# Patient Record
Sex: Female | Born: 1945 | Hispanic: No | Smoking: Former smoker
Health system: Southern US, Community
[De-identification: ages and names within clinical notes are randomized; demographics above are authoritative.]

## PROBLEM LIST (undated history)

## (undated) DIAGNOSIS — Z800 Family history of malignant neoplasm of digestive organs: Secondary | ICD-10-CM

## (undated) DIAGNOSIS — I739 Peripheral vascular disease, unspecified: Secondary | ICD-10-CM

## (undated) DIAGNOSIS — I10 Essential (primary) hypertension: Secondary | ICD-10-CM

## (undated) DIAGNOSIS — M1990 Unspecified osteoarthritis, unspecified site: Secondary | ICD-10-CM

## (undated) DIAGNOSIS — Z803 Family history of malignant neoplasm of breast: Secondary | ICD-10-CM

## (undated) DIAGNOSIS — Z8041 Family history of malignant neoplasm of ovary: Secondary | ICD-10-CM

## (undated) DIAGNOSIS — L309 Dermatitis, unspecified: Secondary | ICD-10-CM

## (undated) HISTORY — PX: TONSILLECTOMY: SUR1361

## (undated) HISTORY — PX: EXCISION OF BREAST BIOPSY: SHX5822

## (undated) HISTORY — PX: ABDOMINAL HYSTERECTOMY: SHX81

## (undated) HISTORY — PX: BREAST EXCISIONAL BIOPSY: SUR124

## (undated) HISTORY — PX: BREAST SURGERY: SHX581

## (undated) HISTORY — DX: Dermatitis, unspecified: L30.9

## (undated) HISTORY — DX: Family history of malignant neoplasm of ovary: Z80.41

## (undated) HISTORY — DX: Family history of malignant neoplasm of digestive organs: Z80.0

## (undated) HISTORY — DX: Family history of malignant neoplasm of breast: Z80.3

## (undated) HISTORY — DX: Unspecified osteoarthritis, unspecified site: M19.90

---

## 1980-11-07 HISTORY — PX: THYROIDECTOMY, PARTIAL: SHX18

## 1997-11-07 HISTORY — PX: EXCISION OF BREAST BIOPSY: SHX5822

## 1997-11-07 HISTORY — PX: ABDOMINAL HYSTERECTOMY: SHX81

## 1997-11-07 HISTORY — PX: BREAST SURGERY: SHX581

## 2010-10-13 ENCOUNTER — Ambulatory Visit: Payer: Self-pay | Admitting: Oncology

## 2011-03-29 ENCOUNTER — Other Ambulatory Visit: Payer: Self-pay | Admitting: Internal Medicine

## 2011-06-06 ENCOUNTER — Encounter: Payer: BC Managed Care – HMO | Admitting: Genetic Counselor

## 2011-06-22 ENCOUNTER — Encounter (HOSPITAL_BASED_OUTPATIENT_CLINIC_OR_DEPARTMENT_OTHER): Payer: BC Managed Care – HMO | Admitting: Oncology

## 2011-06-22 DIAGNOSIS — D493 Neoplasm of unspecified behavior of breast: Secondary | ICD-10-CM

## 2011-06-28 ENCOUNTER — Other Ambulatory Visit: Payer: Self-pay | Admitting: Oncology

## 2011-06-28 DIAGNOSIS — Z1231 Encounter for screening mammogram for malignant neoplasm of breast: Secondary | ICD-10-CM

## 2011-07-18 ENCOUNTER — Ambulatory Visit
Admission: RE | Admit: 2011-07-18 | Discharge: 2011-07-18 | Disposition: A | Discharge status: Home / Self Care | Payer: PRIVATE HEALTH INSURANCE | Source: Ambulatory Visit | Attending: Oncology | Admitting: Oncology

## 2011-07-18 DIAGNOSIS — Z1231 Encounter for screening mammogram for malignant neoplasm of breast: Secondary | ICD-10-CM

## 2011-07-19 ENCOUNTER — Other Ambulatory Visit: Payer: BC Managed Care – HMO

## 2011-07-20 ENCOUNTER — Other Ambulatory Visit: Payer: BC Managed Care – HMO

## 2011-07-20 ENCOUNTER — Other Ambulatory Visit: Payer: Self-pay | Admitting: Oncology

## 2011-07-20 DIAGNOSIS — R928 Other abnormal and inconclusive findings on diagnostic imaging of breast: Secondary | ICD-10-CM

## 2011-07-26 ENCOUNTER — Inpatient Hospital Stay: Admission: RE | Admit: 2011-07-26 | Payer: Self-pay | Source: Ambulatory Visit

## 2011-07-27 ENCOUNTER — Ambulatory Visit
Admission: RE | Admit: 2011-07-27 | Discharge: 2011-07-27 | Disposition: A | Discharge status: Home / Self Care | Payer: PRIVATE HEALTH INSURANCE | Source: Ambulatory Visit | Attending: Oncology | Admitting: Oncology

## 2011-07-27 DIAGNOSIS — R928 Other abnormal and inconclusive findings on diagnostic imaging of breast: Secondary | ICD-10-CM

## 2011-07-31 ENCOUNTER — Ambulatory Visit
Admission: RE | Admit: 2011-07-31 | Discharge: 2011-07-31 | Disposition: A | Discharge status: Home / Self Care | Payer: PRIVATE HEALTH INSURANCE | Source: Ambulatory Visit | Attending: Oncology | Admitting: Oncology

## 2011-07-31 DIAGNOSIS — Z1231 Encounter for screening mammogram for malignant neoplasm of breast: Secondary | ICD-10-CM

## 2011-07-31 MED ORDER — GADOBENATE DIMEGLUMINE 529 MG/ML IV SOLN: 14.0000 mL | Freq: Once | INTRAVENOUS | Status: AC | PRN

## 2011-10-10 ENCOUNTER — Ambulatory Visit: Payer: PRIVATE HEALTH INSURANCE | Admitting: Oncology

## 2012-04-06 ENCOUNTER — Telehealth: Payer: Self-pay | Admitting: *Deleted

## 2012-04-06 NOTE — Telephone Encounter (Signed)
Pt called states " I want Dr. Welton Flakes to know my sister has cancer. They found 5 tumors in her brain and they are like the ovarian cancer type, but her ovaries were removed over 8 years ago.and now it's in her lungs. I need to get a mammogram and an MRI in September. I would like to have this done at [facility] as I've have some really bad experiences at the last place they did my Mammogram. It was terrible.  So I went back to [place] to have it done, but I know I need to have it done here so in case something happens and I need help." Pt advised she is not having any current problems, pain or discomfort. Discussed with pt her last visit with MD 06/22/11. MD may need have pt come in to be seen as she missed her f/u 10/10/11. Pt requested her concerns be given to MD and to call her if she needs to be seen. Reviewed with pt I will inform Dr. Welton Flakes of her concerns and we will contact her if she needs f/u with MD. Pt verbalized understanding.

## 2012-04-12 ENCOUNTER — Telehealth: Payer: Self-pay | Admitting: Oncology

## 2012-04-12 NOTE — Telephone Encounter (Signed)
S/w the pt and she is aware of her sept 2013 appt 

## 2012-04-12 NOTE — Telephone Encounter (Signed)
Please set patient up to see me in August in follow up

## 2012-06-14 ENCOUNTER — Other Ambulatory Visit: Payer: Self-pay | Admitting: Oncology

## 2012-06-14 ENCOUNTER — Telehealth: Payer: Self-pay | Admitting: *Deleted

## 2012-06-14 DIAGNOSIS — Z1231 Encounter for screening mammogram for malignant neoplasm of breast: Secondary | ICD-10-CM

## 2012-06-14 NOTE — Telephone Encounter (Signed)
Pt called states "I have an appt on Sept 9th with MD and I need to know if I have to have a mammogram prior to seeing her. [facility]. Do I need an MRI or the # D imaging or both. I will be out of town from 9/26-10/5." Discusses with pt MD out of office. I will forward concerns and call back with MD's instructions

## 2012-06-15 NOTE — Telephone Encounter (Signed)
Patient can see me after she has the Mammogram

## 2012-06-15 NOTE — Telephone Encounter (Signed)
Per MD notified pt she will be seen her mammogram. Pt verbalized understanding onc ts sent for appt 9/9 to be r/s

## 2012-06-25 ENCOUNTER — Ambulatory Visit: Payer: BC Managed Care – HMO | Admitting: Oncology

## 2012-07-16 ENCOUNTER — Ambulatory Visit: Payer: BC Managed Care – HMO | Admitting: Oncology

## 2012-07-27 ENCOUNTER — Ambulatory Visit
Admission: RE | Admit: 2012-07-27 | Discharge: 2012-07-27 | Disposition: A | Discharge status: Home / Self Care | Payer: Medicare Other | Source: Ambulatory Visit | Attending: Oncology | Admitting: Oncology

## 2012-07-27 DIAGNOSIS — Z1231 Encounter for screening mammogram for malignant neoplasm of breast: Secondary | ICD-10-CM

## 2012-07-30 ENCOUNTER — Telehealth: Payer: Self-pay | Admitting: Oncology

## 2012-07-30 NOTE — Telephone Encounter (Signed)
S/w the pt and she is aware of her r/s sept appt to oct due to the md's schedule

## 2012-08-01 ENCOUNTER — Ambulatory Visit: Payer: BC Managed Care – HMO | Admitting: Oncology

## 2012-09-05 ENCOUNTER — Ambulatory Visit (HOSPITAL_BASED_OUTPATIENT_CLINIC_OR_DEPARTMENT_OTHER): Payer: Medicare Other | Admitting: Adult Health

## 2012-09-05 ENCOUNTER — Encounter: Payer: Self-pay | Admitting: Adult Health

## 2012-09-05 VITALS — BP 206/77 | HR 77 | Temp 98.6°F | Resp 20 | Ht 63.2 in | Wt 155.0 lb

## 2012-09-05 DIAGNOSIS — N6099 Unspecified benign mammary dysplasia of unspecified breast: Secondary | ICD-10-CM

## 2012-09-05 DIAGNOSIS — N6089 Other benign mammary dysplasias of unspecified breast: Secondary | ICD-10-CM

## 2012-09-05 DIAGNOSIS — Z809 Family history of malignant neoplasm, unspecified: Secondary | ICD-10-CM

## 2012-09-05 DIAGNOSIS — M069 Rheumatoid arthritis, unspecified: Secondary | ICD-10-CM | POA: Insufficient documentation

## 2012-09-05 DIAGNOSIS — N6091 Unspecified benign mammary dysplasia of right breast: Secondary | ICD-10-CM | POA: Insufficient documentation

## 2012-09-05 DIAGNOSIS — E785 Hyperlipidemia, unspecified: Secondary | ICD-10-CM | POA: Insufficient documentation

## 2012-09-05 NOTE — Progress Notes (Signed)
OFFICE PROGRESS NOTE  CC  Mcguire,CATHERINE, MD [address] 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 female returns for ongoing surveillance due to her high risk of developing cancer.  She is concerned because her sister has developed metastatic papillary serous carcinoma.  She has questions regarding the necessity of having an MRI.  She had a screening mammogram in September, 2013 and it was negative.    MEDICAL HISTORY:No past medical history on file.  ALLERGIES:   has no known allergies.  MEDICATIONS:  Current Outpatient Prescriptions  Medication Sig Dispense Refill  . folic acid (FOLVITE) 1 MG tablet Take 3 mg by mouth daily.      Marland Kitchen leflunomide (ARAVA) 10 MG tablet Take 10 mg by mouth daily.      . methotrexate (RHEUMATREX) 10 MG tablet Take 10 mg by mouth once a week. Caution: Chemotherapy. Protect from light.      . predniSONE (DELTASONE) 5 MG tablet Take 5 mg by mouth daily.        SURGICAL HISTORY: No past surgical history on file.  REVIEW OF SYSTEMS:   General: fatigue (-), night sweats (-), fever (-), pain (-) Lymph: palpable nodes (-) HEENT: vision changes (-), mucositis (-), gum bleeding (-), epistaxis (-) Cardiovascular: chest pain (-), palpitations (-) Pulmonary: shortness of breath (-), dyspnea on exertion (-), cough (-), hemoptysis (-) GI:  Early satiety (-), melena (-), dysphagia (-), nausea/vomiting (-), diarrhea (-) GU: dysuria (-), hematuria (-), incontinence (-) Musculoskeletal: joint swelling (-), joint pain (-), back pain (-) Neuro: weakness (-), numbness (-), headache (-), confusion (-) Skin: Rash (-), lesions (-), dryness (-) Psych: depression (-), suicidal/homicidal ideation (-), feeling of hopelessness (-)   HEALTH MAINTENANCE:  Mammogram  07/2012 Colonoscopy Bone  Scan Pap Smear n/a Eye Exam n/a Vitamin D n/a Lipid Panel n/a  PHYSICAL EXAMINATION: Blood pressure 206/77, pulse 77, temperature 98.6 F (37 C), temperature source Oral, resp. rate 20, height 5' 3.2" (1.605 m), weight 155 lb (70.308 kg). Body mass index is 27.28 kg/(m^2). ECOG PERFORMANCE STATUS: 0 - Asymptomatic General: Patient is a well appearing female in no acute distress HEENT: PERRLA, sclerae anicteric no conjunctival pallor, MMM Neck: supple, no palpable adenopathy Lungs: clear to auscultation bilaterally, no wheezes, rhonchi, or rales Cardiovascular: regular rate rhythm, S1, S2, no murmurs, rubs or gallops Abdomen: Soft, non-tender, non-distended, normoactive bowel sounds, no HSM Extremities: warm and well perfused, no clubbing, cyanosis, or edema Skin: No rashes or lesions Neuro: Non-focal Breasts: without masses or nodularity.   LABORATORY DATA: No results found for this basename: WBC, HGB, HCT, MCV, PLT      Chemistry   No results found for this basename: NA, K, CL, CO2, BUN, CREATININE, GLU   No results found for this basename: CALCIUM, ALKPHOS, AST, ALT, BILITOT       RADIOGRAPHIC STUDIES:  No results found.  ASSESSMENT: Shelby Mcguire Mcguire is a 66 y/o female with atypical ductal hyperplasia and high risk family history of carcinoma.    PLAN: We will continue to follow her annually.  She will have an MRI of the bilateral breasts due to her high risk status.  She is doing well.     All questions were answered. The patient knows to call the clinic with any problems, questions or concerns. We can certainly see the patient much sooner  if necessary.  I spent 25 minutes counseling the patient face to face. The total time spent in the appointment was 30 minutes.  This visit was done in conjunction with Dr. Welton Flakes.    [name], NP Medical Oncology [facility] Phone: [phone]    09/05/2012, 4:29 PM

## 2012-09-17 NOTE — Patient Instructions (Signed)
We will order your MRI and see you back in one year.  Please call us if you have any questions or concerns.

## 2013-07-02 ENCOUNTER — Other Ambulatory Visit: Payer: Self-pay

## 2013-07-02 DIAGNOSIS — Z1231 Encounter for screening mammogram for malignant neoplasm of breast: Secondary | ICD-10-CM

## 2013-07-06 ENCOUNTER — Ambulatory Visit: Payer: Medicare Other

## 2013-07-06 ENCOUNTER — Ambulatory Visit (INDEPENDENT_AMBULATORY_CARE_PROVIDER_SITE_OTHER): Payer: Medicare Other | Admitting: Family Medicine

## 2013-07-06 VITALS — BP 160/82 | HR 86 | Temp 98.1°F | Resp 18 | Ht 65.0 in | Wt 156.0 lb

## 2013-07-06 DIAGNOSIS — M79609 Pain in unspecified limb: Secondary | ICD-10-CM

## 2013-07-06 DIAGNOSIS — S93602A Unspecified sprain of left foot, initial encounter: Secondary | ICD-10-CM

## 2013-07-06 DIAGNOSIS — M79672 Pain in left foot: Secondary | ICD-10-CM

## 2013-07-06 DIAGNOSIS — S93609A Unspecified sprain of unspecified foot, initial encounter: Secondary | ICD-10-CM

## 2013-07-06 MED ORDER — HYDROCODONE-ACETAMINOPHEN 5-325 MG PO TABS: 1.0000 | ORAL_TABLET | Freq: Four times a day (QID) | ORAL | Status: DC | PRN

## 2013-07-06 MED ORDER — PREDNISONE 20 MG PO TABS: ORAL_TABLET | ORAL | Status: DC

## 2013-07-06 NOTE — Progress Notes (Signed)
T89-year-old woman was walking on the sidewalk when she caught her foot and she fell suddenly. She hyperextended the left foot and scraped her left knee. She also bruised the right proximal tibia. About 2 hours after the injury, she began developing more more pain with weightbearing in the left dorsal lateral foot.  Objective: No acute distress when sitting. Patient has markedly antalgic gait when weightbearing. There is no swelling of the foot and she is only mildly tender over the dorsal lateral aspect of that left foot. Her range of motion appears to be normal and her pulses are normal. There is no edema.  Patient has an abrasion on the infrapatellar left tendon, and a large ecchymotic area on the proximal right medial tibia.  [facility] reading (PRIMARY) by  Dr. Milus Glazier:  Foot x-ray, left:  Negative  Assessment: Foot sprain In patient with rheumatoid arthritis  Plan: .

## 2013-07-07 ENCOUNTER — Ambulatory Visit: Payer: Medicare Other

## 2013-07-07 NOTE — Progress Notes (Signed)
Patient was uncomfortable with the ankle brace and daughter had a Cam Walker at home that she tried on and she like the way it made her ankle felt so she wanted to also try the [employer].  She came in and was fitted for the [employer].

## 2013-07-16 ENCOUNTER — Ambulatory Visit (INDEPENDENT_AMBULATORY_CARE_PROVIDER_SITE_OTHER): Payer: Medicare Other | Admitting: Family Medicine

## 2013-07-16 VITALS — BP 142/86 | HR 74 | Temp 98.2°F | Resp 16

## 2013-07-16 DIAGNOSIS — IMO0002 Reserved for concepts with insufficient information to code with codable children: Secondary | ICD-10-CM

## 2013-07-16 DIAGNOSIS — S81009A Unspecified open wound, unspecified knee, initial encounter: Secondary | ICD-10-CM

## 2013-07-16 DIAGNOSIS — S93602S Unspecified sprain of left foot, sequela: Secondary | ICD-10-CM

## 2013-07-16 DIAGNOSIS — S81802A Unspecified open wound, left lower leg, initial encounter: Secondary | ICD-10-CM

## 2013-07-16 NOTE — Progress Notes (Signed)
Subjective: Patient is here for recheck of her painful foot. Because of some mixup in the lobby, she was not seen and that was realized when all the other patients had left. I spoke to her and offered to see her in the she wanted to see Dr. Elbert Ewings. who was not here. She was willing to see me. She continues to hurt across the midfoot. She wore the Cam Walker for one week and quit wearing it. She wanted to know whether she needs to keep wearing something on it. She is also still has the open wound below her knee which has not healed over yet and continues to weep some.  Objective: Just her left knee there is a wound approximately 1.5 seems in diameter with some early granulation tissue in the borders of it. It was fairly deep, and will take some time healing in. There is no longer minimal erythema around it and no evidence of infection. The knee has good range of motion ankle has good range of motion the midfoot has mild tenderness across the bridge of the foot point. When she walks it hurts her good deal. No pain at the fifth metatarsal area. The toes are normal and pulses are adequate.  Assessment: Left foot pain, status post midfoot sprain Wound knee  Plan Continue wearing Cam Walker another week or so. Keep the wound clean and dressed. Return if further problems. Once again apologized to her for the confusion and mix up.

## 2013-07-16 NOTE — Patient Instructions (Signed)
Continue to wear the Cam Walker for one to 2 more weeks. Once you remove the Cam Walker, continue trying to wear a firm sole supporting shoe. Also wrapped the mid foot with the Coban wrap as discussed.  The wound on the knee should gradually heal in from the parameter. If you see increasing redness and think it is getting infected get rechecked. Try and keep it clean and dry.  If the foot continues to bother you return for reevaluation

## 2013-07-30 ENCOUNTER — Ambulatory Visit
Admission: RE | Admit: 2013-07-30 | Discharge: 2013-07-30 | Disposition: A | Discharge status: Home / Self Care | Payer: Medicare Other | Source: Ambulatory Visit

## 2013-07-30 DIAGNOSIS — Z1231 Encounter for screening mammogram for malignant neoplasm of breast: Secondary | ICD-10-CM

## 2013-08-07 ENCOUNTER — Telehealth: Payer: Self-pay | Admitting: Oncology

## 2013-08-07 NOTE — Telephone Encounter (Signed)
, °

## 2013-08-22 ENCOUNTER — Telehealth: Payer: Self-pay | Admitting: Oncology

## 2013-08-22 NOTE — Telephone Encounter (Signed)
, °

## 2013-08-27 ENCOUNTER — Ambulatory Visit (INDEPENDENT_AMBULATORY_CARE_PROVIDER_SITE_OTHER): Payer: Medicare Other | Admitting: Emergency Medicine

## 2013-08-27 VITALS — BP 142/74 | HR 80 | Temp 98.9°F | Resp 18 | Ht 64.0 in | Wt 155.8 lb

## 2013-08-27 DIAGNOSIS — J209 Acute bronchitis, unspecified: Secondary | ICD-10-CM

## 2013-08-27 DIAGNOSIS — J018 Other acute sinusitis: Secondary | ICD-10-CM

## 2013-08-27 MED ORDER — AMOXICILLIN-POT CLAVULANATE 875-125 MG PO TABS: 1.0000 | ORAL_TABLET | Freq: Two times a day (BID) | ORAL | Status: DC

## 2013-08-27 MED ORDER — HYDROCOD POLST-CHLORPHEN POLST 10-8 MG/5ML PO LQCR: 5.0000 mL | Freq: Two times a day (BID) | ORAL | Status: DC | PRN

## 2013-08-27 MED ORDER — PSEUDOEPHEDRINE-GUAIFENESIN ER 60-600 MG PO TB12: 1.0000 | ORAL_TABLET | Freq: Two times a day (BID) | ORAL | Status: DC

## 2013-08-27 NOTE — Patient Instructions (Signed)

## 2013-08-27 NOTE — Progress Notes (Signed)
[facility] [address], [place] [address] [phone]  Date:  08/27/2013   Name:  Shelby Mcguire   DOB:  1946-05-21   MRN:  [id number]  PCP:  Elby Showers, MD    Chief Complaint: Sore Throat, Cough, Headache and Sinusitis   History of Present Illness:  Shelby Mcguire is a 67 y.o. very pleasant female patient who presents with the following:  Ill since Saturday with nasal congestion and purulent drainage and post nasal drip.  Has a non productive cough and no wheezing or shortness of breath.  Has severe frontal headache.   No nausea or vomiting or visual complaints.  Chilled but no fever.  Grandchildren are ill. No improvement with over the counter medications or other home remedies. Denies other complaint or health concern today.   Patient Active Problem List   Diagnosis Date Noted  . Rheumatoid arthritis 09/05/2012  . Hyperlipidemia 09/05/2012  . Atypical ductal hyperplasia of breast 09/05/2012    Past Medical History  Diagnosis Date  . Arthritis     Past Surgical History  Procedure Laterality Date  . Abdominal hysterectomy    . Cesarean section    . Breast surgery      History  Substance Use Topics  . Smoking status: Never Smoker   . Smokeless tobacco: Not on file  . Alcohol Use: No    Family History  Problem Relation Age of Onset  . Diabetes Mother   . Cancer Father   . Cancer Sister   . Cancer Maternal Aunt   . Cancer Paternal Uncle   . Cancer Sister   . Cancer Sister     No Known Allergies  Medication list has been reviewed and updated.  Current Outpatient Prescriptions on File Prior to Visit  Medication Sig Dispense Refill  . folic acid (FOLVITE) 1 MG tablet Take 3 mg by mouth daily.      Marland Kitchen leflunomide (ARAVA) 10 MG tablet Take 10 mg by mouth daily.      . methotrexate (RHEUMATREX) 10 MG tablet Take 10 mg by mouth once a week. Caution: Chemotherapy. Protect from light.      . predniSONE (DELTASONE) 5 MG tablet  Take 5 mg by mouth daily.      Marland Kitchen HYDROcodone-acetaminophen (NORCO) 5-325 MG per tablet Take 1 tablet by mouth every 6 (six) hours as needed for pain.  20 tablet  0  . predniSONE (DELTASONE) 20 MG tablet 2 daily with food  4 tablet  1   No current facility-administered medications on file prior to visit.    Review of Systems:  As per HPI, otherwise negative.    Physical Examination: Filed Vitals:   08/27/13 1014  BP: 142/74  Pulse: 80  Temp: 98.9 F (37.2 C)  Resp: 18   Filed Vitals:   08/27/13 1014  Height: 5\' 4"  (1.626 m)  Weight: 155 lb 12.8 oz (70.67 kg)   Body mass index is 26.73 kg/(m^2). Ideal Body Weight: Weight in (lb) to have BMI = 25: 145.3  GEN: WDWN, NAD, Non-toxic, A & O x 3 HEENT: Atraumatic, Normocephalic. Neck supple. No masses, No LAD. Ears and Nose: No external deformity. CV: RRR, No M/G/R. No JVD. No thrill. No extra heart sounds. PULM: CTA B, no wheezes, crackles, rhonchi. No retractions. No resp. distress. No accessory muscle use. ABD: S, NT, ND, +BS. No rebound. No HSM. EXTR: No c/c/e NEURO Normal gait.  PSYCH: Normally interactive. Conversant. Not depressed or anxious appearing.  Calm demeanor.    Assessment and Plan: Sinusitis  Bronchitis augmentin mucinex tussionex   Signed,  Phillips Odor, MD

## 2013-09-06 ENCOUNTER — Ambulatory Visit (HOSPITAL_BASED_OUTPATIENT_CLINIC_OR_DEPARTMENT_OTHER): Payer: Medicare Other | Admitting: Oncology

## 2013-09-06 ENCOUNTER — Ambulatory Visit: Payer: Medicare Other | Admitting: Oncology

## 2013-09-06 ENCOUNTER — Encounter: Payer: Self-pay | Admitting: Oncology

## 2013-09-06 VITALS — BP 166/76 | HR 76 | Temp 97.8°F | Resp 20 | Ht 64.0 in | Wt 158.4 lb

## 2013-09-06 DIAGNOSIS — N6099 Unspecified benign mammary dysplasia of unspecified breast: Secondary | ICD-10-CM

## 2013-09-06 DIAGNOSIS — Z809 Family history of malignant neoplasm, unspecified: Secondary | ICD-10-CM

## 2013-09-06 DIAGNOSIS — N6089 Other benign mammary dysplasias of unspecified breast: Secondary | ICD-10-CM

## 2013-09-06 NOTE — Progress Notes (Signed)
OFFICE PROGRESS NOTE  CC  WALSH,CATHERINE, MD [address] 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 female returns for ongoing surveillance due to her high risk of developing cancer.  She is concerned because her sister has developed metastatic papillary serous carcinoma.  She has questions regarding the necessity of having an MRI.  She had a screening mammogram in September, 2013 and it was negative.    MEDICAL HISTORY: Past Medical History  Diagnosis Date  . Arthritis     ALLERGIES:  has No Known Allergies.  MEDICATIONS:  Current Outpatient Prescriptions  Medication Sig Dispense Refill  . folic acid (FOLVITE) 1 MG tablet Take 3 mg by mouth daily.      Marland Kitchen leflunomide (ARAVA) 10 MG tablet Take 10 mg by mouth daily.      . methotrexate (RHEUMATREX) 10 MG tablet Take 10 mg by mouth once a week. Caution: Chemotherapy. Protect from light.      . prednisoLONE 5 MG TABS tablet Take by mouth as needed.       No current facility-administered medications for this visit.    SURGICAL HISTORY:  Past Surgical History  Procedure Laterality Date  . Abdominal hysterectomy    . Cesarean section    . Breast surgery      REVIEW OF SYSTEMS:   General: fatigue (-), night sweats (-), fever (-), pain (-) Lymph: palpable nodes (-) HEENT: vision changes (-), mucositis (-), gum bleeding (-), epistaxis (-) Cardiovascular: chest pain (-), palpitations (-) Pulmonary: shortness of breath (-), dyspnea on exertion (-), cough (-), hemoptysis (-) GI:  Early satiety (-), melena (-), dysphagia (-), nausea/vomiting (-), diarrhea (-) GU: dysuria (-), hematuria (-), incontinence (-) Musculoskeletal: joint swelling (-), joint pain (-), back pain (-) Neuro: weakness (-), numbness (-), headache (-), confusion (-) Skin:  Rash (-), lesions (-), dryness (-) Psych: depression (-), suicidal/homicidal ideation (-), feeling of hopelessness (-)   HEALTH MAINTENANCE:  Mammogram 07/2013 Colonoscopy Bone  Scan Pap Smear n/a Eye Exam n/a Vitamin D n/a Lipid Panel n/a  PHYSICAL EXAMINATION: Blood pressure 166/76, pulse 76, temperature 97.8 F (36.6 C), temperature source Oral, resp. rate 20, height 5\' 4"  (1.626 m), weight 158 lb 6.4 oz (71.85 kg). Body mass index is 27.18 kg/(m^2). ECOG PERFORMANCE STATUS: 0 - Asymptomatic General: Patient is a well appearing female in no acute distress HEENT: PERRLA, sclerae anicteric no conjunctival pallor, MMM Neck: supple, no palpable adenopathy Lungs: clear to auscultation bilaterally, no wheezes, rhonchi, or rales Cardiovascular: regular rate rhythm, S1, S2, no murmurs, rubs or gallops Abdomen: Soft, non-tender, non-distended, normoactive bowel sounds, no HSM Extremities: warm and well perfused, no clubbing, cyanosis, or edema Skin: No rashes or lesions Neuro: Non-focal Breasts: without masses or nodularity.   LABORATORY DATA: No results found for this basename: WBC,  HGB,  HCT,  MCV,  PLT      Chemistry   No results found for this basename: NA,  K,  CL,  CO2,  BUN,  CREATININE,  GLU   No results found for this basename: CALCIUM,  ALKPHOS,  AST,  ALT,  BILITOT       RADIOGRAPHIC STUDIES:  No results found.  ASSESSMENT: [name] is a 67 y/o female with atypical ductal hyperplasia and high risk family history of carcinoma. Patient has been on annual surveillance mammogram and MRI  every other year. Her last MRI was done in about 3-4 years ago. She also has a history of arthritis and is on methotrexate.   PLAN:   #1 patient will proceed with annual mammogram and MRI next year 2015.  #2 I will see her back in one years time  #3 patient is encouraged to continue self breast examinations exercise eating healthy good lifestyle.    All questions were  answered. The patient knows to call the clinic with any problems, questions or concerns. We can certainly see the patient much sooner if necessary.  The length of time of the face-to-face encounter was 25    minutes. More than 50% of time was spent counseling and coordination of care.   Drue Second, MD Medical/Oncology [facility] [phone] (beeper) [phone] (Office)  09/06/2013, 4:22 PM

## 2013-09-09 ENCOUNTER — Telehealth: Payer: Self-pay | Admitting: Oncology

## 2013-09-09 NOTE — Telephone Encounter (Signed)
, °

## 2013-11-04 ENCOUNTER — Ambulatory Visit (INDEPENDENT_AMBULATORY_CARE_PROVIDER_SITE_OTHER): Payer: Medicare Other | Admitting: Podiatry

## 2013-11-04 ENCOUNTER — Encounter: Payer: Self-pay | Admitting: Podiatry

## 2013-11-04 VITALS — BP 137/79 | HR 74 | Resp 18

## 2013-11-04 DIAGNOSIS — M779 Enthesopathy, unspecified: Secondary | ICD-10-CM

## 2013-11-04 NOTE — Progress Notes (Signed)
° °  Subjective:    Patient ID: Shelby Mcguire, female    DOB: [birth year], 67 y.o.   MRN: [id number]  HPI These calluses on my left foot are bothering me and sore and hurts to walk and been going on for about 2 months and has some fungus on my big toenail on my right big toe and had it for years and the hammertoe on 3rd left gets numb and been going on for about 2 years  Patient was last evaluated 02/01/2013 4 hyperkeratotic lesions.  Review of Systems  Constitutional: Positive for fatigue.  HENT: Negative.   Eyes:       Drops in eyes in am   Respiratory: Negative.   Cardiovascular: Negative.   Gastrointestinal: Negative.   Endocrine: Negative.   Genitourinary: Negative.   Musculoskeletal: Negative.   Skin: Negative.   Allergic/Immunologic: Negative.   Neurological: Negative.   Hematological: Bruises/bleeds easily.  Psychiatric/Behavioral: Negative.        Objective:   Physical Exam 67 year old white female orientated x3  Vascular: DP and PT pulses are two over four bilaterally  Dermatological: Soft spongy bursal sac noted plantar right first MPJ. Keratoses fourth MPJ left foot. Mild atrophy of fat pad MPJ bilaterally. Color changes in the right hallux noted  Musculoskeletal: Mallet toe third left noted.       Assessment & Plan:   Assessment:  Atrophy of plantar fat pad bilaterally with associated metatarsalgia Satisfactory vascular status Plantar keratoses left Trauma right hallux nail versus possible beginning onychomycoses  Plan: Advised patient to tried over-the-counter support with metatarsal raise. I applied a metatarsal pad to the left foot and she noticed immediate relief in the area. No active treatment for hallux nail recommended at this time.

## 2013-11-04 NOTE — Patient Instructions (Signed)
Tried over-the-counter foot support with a metatarsal raise.

## 2014-04-17 ENCOUNTER — Other Ambulatory Visit: Payer: Self-pay | Admitting: Adult Health

## 2014-04-17 DIAGNOSIS — N6099 Unspecified benign mammary dysplasia of unspecified breast: Secondary | ICD-10-CM

## 2014-04-17 DIAGNOSIS — Z1239 Encounter for other screening for malignant neoplasm of breast: Secondary | ICD-10-CM

## 2014-04-18 ENCOUNTER — Telehealth: Payer: Self-pay | Admitting: Hematology and Oncology

## 2014-04-18 NOTE — Telephone Encounter (Signed)
mammo order fixed by LC. retunred pt's call w/appt for mammo @ [facility] 08/01/14 @ 10:30am. central will call re mri/breast to be done  @[facility]  - pt was made aware of this in our previous conversation. moved 10/8 f/u from [place] to [facility] appt for lab remains the same. schedule mailed.

## 2014-04-24 ENCOUNTER — Telehealth: Payer: Self-pay | Admitting: Hematology and Oncology

## 2014-04-24 NOTE — Telephone Encounter (Signed)
,  d

## 2014-07-10 ENCOUNTER — Ambulatory Visit (INDEPENDENT_AMBULATORY_CARE_PROVIDER_SITE_OTHER): Payer: Medicare Other | Admitting: Family Medicine

## 2014-07-10 VITALS — BP 160/72 | HR 71 | Temp 98.0°F | Resp 18 | Ht 64.0 in | Wt 156.8 lb

## 2014-07-10 DIAGNOSIS — J018 Other acute sinusitis: Secondary | ICD-10-CM

## 2014-07-10 DIAGNOSIS — M069 Rheumatoid arthritis, unspecified: Secondary | ICD-10-CM

## 2014-07-10 MED ORDER — AMOXICILLIN-POT CLAVULANATE 875-125 MG PO TABS: 1.0000 | ORAL_TABLET | Freq: Two times a day (BID) | ORAL | Status: DC

## 2014-07-10 MED ORDER — PSEUDOEPHEDRINE-GUAIFENESIN ER 60-600 MG PO TB12: 1.0000 | ORAL_TABLET | Freq: Two times a day (BID) | ORAL | Status: DC

## 2014-07-10 NOTE — Patient Instructions (Signed)

## 2014-07-10 NOTE — Progress Notes (Signed)
  Shelby Mcguire - 68 y.o. female MRN [id number]  Date of birth: 08/02/1946  SUBJECTIVE:  Including CC & ROS.  The patient is presenting for evaluation of: Sinus Pressure and Head Pain: Patient reports 7 days of prodromal symptoms including nasal congestion and scratchy throat with acute worsening over the past 48 hours. Denies any fevers, chills, difficulty breathing. No significant productive cough with occasional clear her throat. No night sweats, rigors, or chills.   HISTORY: Past Medical, Surgical, Social, and Family History Reviewed & Updated per EMR. Pertinent Historical Findings include: RHA on methotrexate and prednisolone (prn), HLD  Multiple prior Sinus infecitons  OBJECTIVE FINDINGS:  VS:  HT:5\' 4"  (162.6 cm)   WT:156 lb 12.8 oz (71.124 kg)  BMI:27          BP:160/72 mmHg  HR:71bpm  TEMP:98 F (36.7 C)(Oral)  RESP:97 %  PHYSICAL EXAM:            GENERAL:  Adult caucasian female. In no discomfort; no respiratory distress                PSYCH:  alert and appropriate, good insight      HNEENT:  mmm, no JVD. Bilateral tympanic membranes are are pearly gray. There is bilateral serous effusion. She has significant tenderness palpation over the frontal sinuses as well as maxillary sinuses. Anterior cervical lymphadenopathy. Small amount of Posterior oropharyngeal erythema with    CARDIAC:  RRR, S1/S2 heard, no murmur       LUNGS:  CTA B, no wheezes, no crackle  ASSESSMENT: 1. Other acute sinusitis   2. Rheumatoid arthritis    symptoms consistent with previous sinus infections. Prodromal symptoms for approximately a week with acute worsening over the past 2 days. She is immunocompromised being on methotrexate. Has been greater than 2 months since her last corticosteroid use, no concern about needing to stress dose steroids.  PLAN: See problem based charting & AVS for additional documentation. Augmentin Mucinex D Follow up PRN Given warning signs for potential need for stress  dosing of steroids.

## 2014-07-31 ENCOUNTER — Other Ambulatory Visit: Payer: Medicare Other | Admitting: Lab

## 2014-07-31 ENCOUNTER — Other Ambulatory Visit: Payer: Medicare Other

## 2014-08-01 ENCOUNTER — Ambulatory Visit
Admission: RE | Admit: 2014-08-01 | Discharge: 2014-08-01 | Disposition: A | Discharge status: Home / Self Care | Payer: Medicare Other | Source: Ambulatory Visit | Attending: Adult Health | Admitting: Adult Health

## 2014-08-01 ENCOUNTER — Encounter: Payer: Self-pay | Admitting: Hematology and Oncology

## 2014-08-01 DIAGNOSIS — Z1239 Encounter for other screening for malignant neoplasm of breast: Secondary | ICD-10-CM

## 2014-08-01 NOTE — Progress Notes (Unsigned)
08/01/2014 I spoke with patient this morning about her upcoming Breast Mri. I did call her insurance company and was told that her breast mri DID NOT REQUIRE PRE CERT. I gave the patient the ref# given to me  Patient voiced concern about being billed for this procedure.  REF# [id number]  Medicare A and B is her primary coverage.[employer] is her secondary. Stanford Breed

## 2014-08-11 ENCOUNTER — Ambulatory Visit (HOSPITAL_COMMUNITY)
Admission: RE | Admit: 2014-08-11 | Discharge: 2014-08-11 | Disposition: A | Discharge status: Home / Self Care | Payer: Medicare Other | Source: Ambulatory Visit | Attending: Oncology | Admitting: Oncology

## 2014-08-11 DIAGNOSIS — N6099 Unspecified benign mammary dysplasia of unspecified breast: Secondary | ICD-10-CM | POA: Diagnosis present

## 2014-08-11 MED ORDER — GADOBENATE DIMEGLUMINE 529 MG/ML IV SOLN
14.0000 mL | Freq: Once | INTRAVENOUS | Status: AC | PRN
  Administered 2014-08-11: 14 mL via INTRAVENOUS

## 2014-08-14 ENCOUNTER — Ambulatory Visit: Payer: Medicare Other | Admitting: Hematology and Oncology

## 2014-08-14 ENCOUNTER — Ambulatory Visit: Payer: Medicare Other | Admitting: Oncology

## 2014-08-25 ENCOUNTER — Ambulatory Visit (HOSPITAL_BASED_OUTPATIENT_CLINIC_OR_DEPARTMENT_OTHER): Payer: Medicare Other | Admitting: Hematology and Oncology

## 2014-08-25 ENCOUNTER — Other Ambulatory Visit: Payer: Self-pay | Admitting: Hematology and Oncology

## 2014-08-25 ENCOUNTER — Telehealth: Payer: Self-pay | Admitting: Hematology and Oncology

## 2014-08-25 VITALS — BP 169/75 | HR 83 | Temp 98.4°F | Resp 18 | Ht 64.0 in | Wt 159.9 lb

## 2014-08-25 DIAGNOSIS — N6099 Unspecified benign mammary dysplasia of unspecified breast: Secondary | ICD-10-CM

## 2014-08-25 DIAGNOSIS — N62 Hypertrophy of breast: Secondary | ICD-10-CM

## 2014-08-25 DIAGNOSIS — Z1231 Encounter for screening mammogram for malignant neoplasm of breast: Secondary | ICD-10-CM

## 2014-08-25 NOTE — Telephone Encounter (Signed)
per pof to sch pt appt-gave pt copy of sch-pt stated will sch her mamma-pt chose date & time for appt

## 2014-08-25 NOTE — Progress Notes (Signed)
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 She underwent oophorectomy because of her extensive family history of breast cancers. She will subjective a counseling long time ago but it was reported to her as inconclusive. I cannot find the actual genetic report. We will try to obtain that report and help counsel her regarding her risk of breast cancer base of genetic testing. Patient denies any new lumps or nodules or any other concerns. She underwent a recent MRI and mammograms and is here today to discuss the results.  REVIEW OF SYSTEMS:   Constitutional: Denies fevers, chills or abnormal weight loss Eyes: Denies blurriness of vision Ears, nose, mouth, throat, and face: Denies mucositis or sore throat Respiratory: Denies cough, dyspnea or wheezes Cardiovascular: Denies palpitation, chest discomfort or lower extremity swelling Gastrointestinal:  Denies nausea, heartburn or change in bowel habits Skin: Denies abnormal skin rashes Lymphatics: Denies new lymphadenopathy or easy bruising Neurological:Denies numbness, tingling or new weaknesses Behavioral/Psych: Mood is stable, no new changes  Breast:  denies any pain or lumps or nodules in either breasts All other systems were reviewed with the patient and are negative.  I have reviewed the past medical history, past surgical history, social history and family history with the patient and they are unchanged from previous note.  ALLERGIES:  has  No Known Allergies.  MEDICATIONS:  Current Outpatient Prescriptions  Medication Sig Dispense Refill  . folic acid (FOLVITE) 1 MG tablet Take 3 mg by mouth daily.      . hydrocortisone 2.5 % cream       . leflunomide (ARAVA) 10 MG tablet Take 10 mg by mouth daily.      . methotrexate (RHEUMATREX) 10 MG tablet Take 10 mg by mouth once a week. Caution: Chemotherapy. Protect from light.      . prednisoLONE 5 MG TABS tablet Take by mouth as needed.       No current facility-administered medications for this visit.    PHYSICAL EXAMINATION: ECOG PERFORMANCE STATUS: 0 - Asymptomatic  Filed Vitals:   08/25/14 1217  BP: 169/75  Pulse: 83  Temp: 98.4 F (36.9 C)  Resp: 18   Filed Weights   08/25/14 1217  Weight: 159 lb 14.4 oz (72.53 kg)    GENERAL:alert, no distress and comfortable SKIN: skin color, texture, turgor are normal, no rashes or significant lesions EYES: normal, Conjunctiva are pink and non-injected, sclera clear OROPHARYNX:no exudate, no erythema and lips, buccal mucosa, and tongue normal  NECK: supple, thyroid normal size, non-tender, without nodularity LYMPH:  no palpable lymphadenopathy in the cervical, axillary or inguinal LUNGS: clear to auscultation and percussion with normal breathing effort HEART: regular rate & rhythm and no murmurs and no lower extremity edema ABDOMEN:abdomen soft, non-tender and normal bowel sounds Musculoskeletal:no cyanosis of digits and no clubbing  NEURO: alert & oriented x 3 with fluent speech, no focal motor/sensory deficits BREAST: No palpable masses or nodules in either right or left breasts. No palpable axillary supraclavicular or infraclavicular adenopathy no breast tenderness or nipple discharge.   LABORATORY DATA:  I have reviewed the data as listed   Chemistry   No results found for this basename: NA, K, CL, CO2, BUN, CREATININE, GLU   No results found for this basename: CALCIUM, ALKPHOS, AST, ALT, BILITOT       No  results found for this basename: WBC, HGB, HCT, MCV, PLT, NEUTROABS     RADIOGRAPHIC STUDIES: I have personally reviewed the radiology reports and agreed with their findings. No results found.   ASSESSMENT & PLAN:  Atypical ductal hyperplasia of breast Right breast Atypical ductal hyperplasia with very high risk family history of breast cancer: I reviewed the MRI and a mammogram reports. These were normal. Today's breast exam did not reveal any abnormalities. I recommended continued annual mammograms and every other year MRIs. Patient was very distraught during this visit because of lack of an appointment but she felt that she had one. I was able to answer all of her questions and concerns and recommended annual followup as above scheduled.   Orders Placed This Encounter  Procedures  . MM Digital Diagnostic Bilat    Standing Status: Future     Number of Occurrences:      Standing Expiration Date: 08/25/2015    Order Specific Question:  Reason for Exam (SYMPTOM  OR DIAGNOSIS REQUIRED)    Answer:  high risk breast evaluation annual    Order Specific Question:  Preferred imaging location?    Answer:  [facility]   The patient has a good understanding of the overall plan. she agrees with it. She will call with any problems that may develop before her next visit here.  I spent 15 minutes counseling the patient face to face. The total time spent in the appointment was 15 minutes and more than 50% was on counseling and review of test results    Rulon Eisenmenger, MD 08/25/2014 3:02 PM

## 2014-08-25 NOTE — Assessment & Plan Note (Signed)
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 These were normal. Today's breast exam did not reveal any abnormalities. I recommended continued annual mammograms and every other year MRIs. Patient was very distraught during this visit because of lack of an appointment but she felt that she had one. I was able to answer all of her questions and concerns and recommended annual followup as above scheduled.

## 2014-08-26 ENCOUNTER — Telehealth: Payer: Self-pay | Admitting: Hematology and Oncology

## 2014-08-26 NOTE — Telephone Encounter (Signed)
LM to confirm appt for Oct 2016 also to confirm Sept 2016 Mammo.

## 2015-01-24 ENCOUNTER — Other Ambulatory Visit: Payer: Self-pay | Admitting: Sports Medicine

## 2015-01-28 ENCOUNTER — Ambulatory Visit (INDEPENDENT_AMBULATORY_CARE_PROVIDER_SITE_OTHER): Payer: Medicare Other | Admitting: Sports Medicine

## 2015-01-28 VITALS — BP 148/76 | HR 72 | Temp 97.9°F | Resp 17 | Ht 65.0 in | Wt 158.6 lb

## 2015-01-28 DIAGNOSIS — J028 Acute pharyngitis due to other specified organisms: Principal | ICD-10-CM

## 2015-01-28 DIAGNOSIS — J029 Acute pharyngitis, unspecified: Secondary | ICD-10-CM | POA: Diagnosis not present

## 2015-01-28 DIAGNOSIS — B9689 Other specified bacterial agents as the cause of diseases classified elsewhere: Secondary | ICD-10-CM

## 2015-01-28 MED ORDER — DOXYCYCLINE HYCLATE 100 MG PO CAPS: 100.0000 mg | ORAL_CAPSULE | Freq: Two times a day (BID) | ORAL | Status: DC

## 2015-01-28 NOTE — Progress Notes (Signed)
  Shelby Mcguire - 69 y.o. female MRN [id number]  Date of birth: 02/27/1946  SUBJECTIVE:  Including CC & ROS.  URI HPI: Onset of symptoms: 7-10 Days Symptoms include: yes Nasal congestion, yes nasal drainage , color of drainage is none, severe sore throat, yes fullness in the ears , yes sinus pressure, yes headache, no fever, no chills, no bodyache, yes fatigue,  no cough, no mucous, no SOB. Yes 35 years ago smoked 1 ppd for 15 year- history of tobacco use, no history of asthma, no history of COPD. Denies Nausea, vomiting, diarrhea.  Appetite no, and Drinking fluids Relieving factors: zyrtec today no improve. She report recently taking a prescription of azithromycin starting 01/15/15 for similar upper respiratory symptoms and describes the symptoms have not resolved. Risk factor of being on methotrexate which is immunosuppressive Symptoms not improving but no worse Vital signs reviewed: Normal respirations, normal pulse ox, normal temperature, normal pulse  ROS:  Constitutional:  No fever, chills, or fatigue.  Respiratory:  No shortness of breath, cough, or wheezing Cardiovascular:  No palpitations, chest pain or syncope Gastrointestinal:  No nausea, no abdominal pain Review of systems otherwise negative except for what is stated in HPI  HISTORY: Past Medical, Surgical, Social, and Family History Reviewed & Updated per EMR. Pertinent Historical Findings include: RA on PRN prednisone  PHYSICAL EXAM:  VS: BP:(!) 148/76 mmHg  HR:72bpm  TEMP:97.9 F (36.6 C)(Oral)  RESP:96 %  HT:5\' 5"  (165.1 cm)   WT:158 lb 9.6 oz (71.94 kg)  BMI:26.4 PHYSICAL EXAM: General:  Alert and oriented, No acute distress.   HENT:  Normocephalic, Oral mucosa is moist. Eyes are equal and reactive to light, normal conjunctivae, normal hearing, mucous membrane is moist, no erythema, no exudate.  Bilateral ears have fluid with bulging but no erythema, TMs are intact.  Both nasal passages are inflamed, erythematous,  with clear drainage and inflamed turbinate.  Sinus passages are tender to palpation.  Submandibular glands are fluctuant mobile and soft. Respiratory:  Lungs are clear to auscultation, Respirations are non-labored, Symmetrical chest wall expansion.   Cardiovascular:  Normal rate, Regular rhythm, No murmur, Good pulses equal in all extremities, No edema.   Gastrointestinal:  Soft, Non-tender, Non-distended, Normal bowel sounds, No organomegaly.   Integumentary:  Warm, Dry, No rash.   Neurologic:  Alert, Oriented, No focal defects Psychiatric:  Cooperative, Appropriate mood & affect.    ASSESSMENT & PLAN:  Patient's a 69 year old female past history significant for RA currently on Menest suppressive therapy with methotrexate. She's had intermittent upper respiratory symptoms of nasal congestion, postnasal drip, and sore throat for now 10 days to 14 days. Given the duration of her symptoms I recommended treating for bacterial pharyngitis and sinusitis with doxycycline given that she failed treatment with erythromycin. Patient was agreeable this plan. Advised patient to treat other symptoms with over-the-counter agents. Patient was given handout on recommendations OTC Agents to use. Advised patient to follow-up symptoms do not improve over the next 7-10 days.

## 2015-01-30 ENCOUNTER — Telehealth: Payer: Self-pay

## 2015-01-30 NOTE — Telephone Encounter (Signed)
Pt states she needs 800 mg of penicillin for her sinus infection. She saw Alinda Deem Didiano, DO at 01/28/2015 6:52 PM  For Acute bacterial pharyngitis , and given doxycycline (VIBRAMYCIN) 100 MG capsule [[id number]]. She states that this is not working for her.

## 2015-01-30 NOTE — Telephone Encounter (Signed)
Spoke to pt. I told her it's only been a couple of days on the abx. She said she will try to wait a couple more days and see how she feels. I told her if she is still not feeling better in the next day or two to give [place] a call back. Pt was okay with the plan.

## 2015-02-03 ENCOUNTER — Telehealth: Payer: Self-pay

## 2015-02-03 NOTE — Telephone Encounter (Addendum)
Pt called back saying that she is still not feeling well. She is upset that a throat culture was not done. She is worried about another round of abx because of her methotrexate. She has been off of it and does not want to be off of it again.  Pt does not want to come back in for an office visit; said she does not "want to wait two hours again."

## 2015-02-04 NOTE — Telephone Encounter (Signed)
Called patient and she reported that she did not need anything from [facility] as of right now as she was just leaving her [place] office.  Philis Fendt, MS, PA-C   2:26 PM, 02/04/2015

## 2015-02-12 ENCOUNTER — Telehealth: Payer: Self-pay | Admitting: Genetic Counselor

## 2015-02-12 ENCOUNTER — Encounter: Payer: Self-pay | Admitting: Genetic Counselor

## 2015-02-12 DIAGNOSIS — Z1379 Encounter for other screening for genetic and chromosomal anomalies: Secondary | ICD-10-CM | POA: Insufficient documentation

## 2015-02-12 NOTE — Telephone Encounter (Signed)
GENETIC APPT-S/W PATIENT DTR AND GAVE GENETIC APPT FOR 04/25 @ 9

## 2015-03-02 ENCOUNTER — Encounter: Payer: Self-pay | Admitting: Genetic Counselor

## 2015-03-02 ENCOUNTER — Other Ambulatory Visit: Payer: Medicare Other

## 2015-03-02 ENCOUNTER — Ambulatory Visit (HOSPITAL_BASED_OUTPATIENT_CLINIC_OR_DEPARTMENT_OTHER): Payer: Medicare Other | Admitting: Genetic Counselor

## 2015-03-02 DIAGNOSIS — N6099 Unspecified benign mammary dysplasia of unspecified breast: Secondary | ICD-10-CM

## 2015-03-02 DIAGNOSIS — Z8041 Family history of malignant neoplasm of ovary: Secondary | ICD-10-CM

## 2015-03-02 DIAGNOSIS — Z803 Family history of malignant neoplasm of breast: Secondary | ICD-10-CM | POA: Diagnosis not present

## 2015-03-02 DIAGNOSIS — N62 Hypertrophy of breast: Secondary | ICD-10-CM

## 2015-03-02 DIAGNOSIS — Z800 Family history of malignant neoplasm of digestive organs: Secondary | ICD-10-CM

## 2015-03-02 NOTE — Progress Notes (Signed)
REFERRING PROVIDER: Aldean Jewett, MD [place], [place] [address]   Nicholas Lose, MD  PRIMARY PROVIDER:  Myrtis Ser, MD  PRIMARY REASON FOR VISIT:  1. Atypical ductal hyperplasia of breast   2. Family history of breast cancer   3. Family history of ovarian cancer   4. Family history of colon cancer      HISTORY OF PRESENT ILLNESS:   Shelby Mcguire, a 69 y.o. female, was seen for a [place] cancer genetics consultation at the request of Dr. Volanda Napoleon due to a personal and family history of cancer.  [name] presents to clinic today to discuss the possibility of a hereditary predisposition to cancer, genetic testing, and to further clarify her future cancer risks, as well as potential cancer risks for family members.   In 1999, at the age of 8, Ms. Bieker was diagnosed with atypcial ductal hyperplasia of the right breast. This was treated with lumpectomy and 5 years of tamoxifen.  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  Today, after her appointment, she found out that her 57 year old daughter had a mammogram and was recalled for further testing.   CANCER HISTORY:   No history exists.     HORMONAL RISK FACTORS:  Menarche was at age 28.  First live birth at age 18.  OCP use for approximately 0 years.  Ovaries intact: no.  Hysterectomy: yes.  Menopausal status: postmenopausal.  HRT use: 0 years. Colonoscopy: yes; every 5 years. Mammogram within the last year: yes. Number of breast biopsies: 1. Up to date with pelvic exams:  Had hysterectomy. Any excessive radiation exposure in the past:  no  Past Medical History  Diagnosis Date  . Arthritis   . Family history of breast cancer   . Family history of ovarian cancer   . Family history of colon cancer     Past Surgical History  Procedure Laterality Date  . Abdominal hysterectomy    .  Cesarean section    . Breast surgery      History   Social History  . Marital Status: Divorced    Spouse Name: N/A  . Number of Children: 2  . Years of Education: N/A   Social History Main Topics  . Smoking status: Former Smoker -- 1.00 packs/day for 15 years    Quit date: 03/01/1980  . Smokeless tobacco: Not on file  . Alcohol Use: No  . Drug Use: No  . Sexual Activity: No   Other Topics Concern  . None   Social History Narrative     FAMILY HISTORY:  We obtained a detailed, 4-generation family history.  Significant diagnoses are listed below: Family History  Problem Relation Age of Onset  . Diabetes Mother   . Prostate cancer Father     dx late 9s  . Breast cancer Sister 66  . Colon cancer Maternal Aunt     dx in her late 75s  . Cancer Paternal Uncle   . Ovarian cancer Sister 75  . Brain cancer Sister 25  . Alcohol abuse Son   . Lung cancer Maternal Aunt    Ms. Oguinn, had three full sisters and a maternal half sister.  One full sister died of ovarian cancer, a full sister died of breast cancer and a full sister was recently diagnosed with a brain tumor.  HEr father  had prostate cancer in his 73s.  He had several family members, some who had cancer, but she is not close to this side of the family.  Her mother had nine brothers and sisters.  One sister had colon cancer in her late 73s, and another possibly had lung cancer. Patient's maternal ancestors are of [place] and [place] descent, and paternal ancestors are of [place] descent. There is no reported Ashkenazi Jewish ancestry. There is no known consanguinity.  GENETIC COUNSELING ASSESSMENT: Charleene Callegari is a 69 y.o. female with a family history of cancer which somewhat suggestive of a hereditary cancer syndrome and predisposition to cancer. We, therefore, discussed and recommended the following at today's visit.   DISCUSSION: We reviewed the characteristics, features and inheritance patterns of hereditary  cancer syndromes. We discussed the possibility of BRCA mutations (e.g. VUS interpreted as positive), or possibly some other hereditary cancer syndrome associated with breast and ovairan cancer.  We also discussed genetic testing, including the appropriate family members to test, the process of testing, insurance coverage and turn-around-time for results. We discussed the implications of a negative, positive and/or variant of uncertain significant result. We recommended [name] pursue genetic testing for the Breast/Ovarian cancer gene panel. The Breast/Ovarian gene panel offered by [facility] includes sequencing and rearrangement analysis for the following 21 genes:  ATM, BARD1, BRCA1, BRCA2, BRIP1, CDH1, CHEK2, EPCAM, FANCC, MLH1, MSH2, MSH6, NBN, PALB2, PMS2, PTEN, RAD51C, RAD51D, STK11, TP53, and XRCC2.     PLAN: After considering the risks, benefits, and limitations, [name]  provided informed consent to pursue genetic testing and the blood sample was sent to [facility] for analysis of the Breast/Ovarian Cancer gene panel. Results should be available within approximately 2-3 weeks' time, at which point they will be disclosed by telephone to [name], as will any additional recommendations warranted by these results. [name] will receive a summary of her genetic counseling visit and a copy of her results once available. This information will also be available in Epic. We encouraged [name] to remain in contact with cancer genetics annually so that we can continuously update the family history and inform her of any changes in cancer genetics and testing that may be of benefit for her family. [name] questions were answered to her satisfaction today. Our contact information was provided should additional questions or concerns arise.  Lastly, we encouraged [name] to remain in contact with cancer genetics annually so that we can continuously update the family history and inform her of  any changes in cancer genetics and testing that may be of benefit for this family.   Ms.  Rossmann questions were answered to her satisfaction today. Our contact information was provided should additional questions or concerns arise. Thank you for the referral and allowing [place] to share in the care of your patient.   Kalayah Leske P. Florene Glen, [place], [facility] Certified Genetic Counselor [email or web address] phone: [phone]  The patient was seen for a total of 60 minutes in face-to-face genetic counseling.  This patient was discussed with Drs. Magrinat, Lindi Adie and/or Burr Medico who agrees with the above.    _______________________________________________________________________ For Office Staff:  Number of people involved in session: 1 Was an Intern/ student involved with case: no

## 2015-03-19 ENCOUNTER — Telehealth: Payer: Self-pay | Admitting: Genetic Counselor

## 2015-03-19 NOTE — Telephone Encounter (Signed)
Shelby Mcguire called to get information about her mother's genetic testing.  Discussed that the BRCA2 VUS was found, but that everything else was negative.  Shelby Mcguire asked about genetic testing for herself.  Explained that based on her mother's genetic testing, we would not have anything specific that we would recommend testing for.  We do not perform testing for VUSs because we would not change her medical management based on a VUS, such as offer a mastectomy.  Shelby Mcguire voiced her understanding, but insisted that she can make her own decisions about this and she wants testing and would like to pursue it.

## 2015-03-19 NOTE — Telephone Encounter (Signed)
Revealed negative genetic testing on the Comprehensive cancer panel.  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  She would like for [place] to talk with her daughter, Huntley Dec.  She will have Anderson Malta call me.

## 2015-03-20 ENCOUNTER — Encounter: Payer: Self-pay | Admitting: Genetic Counselor

## 2015-03-20 DIAGNOSIS — Z8041 Family history of malignant neoplasm of ovary: Secondary | ICD-10-CM

## 2015-03-20 DIAGNOSIS — Z800 Family history of malignant neoplasm of digestive organs: Secondary | ICD-10-CM

## 2015-03-20 DIAGNOSIS — Z1379 Encounter for other screening for genetic and chromosomal anomalies: Secondary | ICD-10-CM

## 2015-03-20 DIAGNOSIS — Z803 Family history of malignant neoplasm of breast: Secondary | ICD-10-CM

## 2015-03-20 NOTE — Progress Notes (Signed)
HPI: Shelby Mcguire was previously seen in the [facility] due to a family history of cancer and concerns regarding a hereditary predisposition to cancer. Please refer to our prior cancer genetics clinic note for more information regarding Shelby Mcguire's medical, social and family histories, and our assessment and recommendations, at the time. Shelby Mcguire recent genetic test results were disclosed to her, as were recommendations warranted by these results. These results and recommendations are discussed in more detail below.  GENETIC TEST RESULTS: Shelby Mcguire had been tested in the past through [facility].  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  At the time of Shelby Mcguire current visit, we recommended she pursue genetic testing of the Comprehensive Cancer gene panel. The Comprehensive Cancer Panel offered by [facility] includes sequencing and/or deletion duplication testing of the following 32 genes: APC, ATM, AXIN2, BARD1, BMPR1A, BRCA1, BRCA2, BRIP1, CDH1, CDK4, CDKN2A, CHEK2, EPCAM, FANCC, MLH1, MSH2, MSH6, MUTYH, NBN, PALB2, PMS2, POLD1, POLE, PTEN, RAD51C, RAD51D, SCG5/GREM1, SMAD4, STK11, TP53, VHL, and XRCC2.   The report date is Mar 18, 2015.  Genetic testing was normal, and did not reveal a deleterious mutation in these genes. The test report has been scanned into EPIC and is located under the Media tab.   We discussed with Shelby Mcguire that since the current genetic testing is not perfect, it is possible there may be a gene mutation in one of these genes that current testing cannot detect, but that chance is small. We also discussed, that it is possible that another gene that has not yet been discovered, or that we have not yet tested, is responsible for the cancer diagnoses in the family, and it is, therefore, important to remain in touch with cancer genetics in the future so that we can continue to offer Shelby Mcguire the most up to date genetic testing.    Genetic testing did detect a Variant of Unknown Significance in the BRCA2 gene called c.7938C>G - alternate nomenclature is BRCA2 c.8166C>G. At this time, it is unknown if this variant is associated with increased cancer risk or if this is a normal finding, but most variants such as this get reclassified to being inconsequential. It should not be used to make medical management decisions, and other family members should not be specifically tested for this variant. With time, we suspect the lab will determine the significance of this variant, if any. If we do learn more about it, we will try to contact Shelby Mcguire to discuss it further. However, it is important to stay in touch with [place] periodically and keep the address and phone number up to date.   CANCER SCREENING RECOMMENDATIONS: This normal result is reassuring and indicates that Shelby Mcguire does not likely have an increased risk of cancer due to a mutation in one of these genes.  We, therefore, recommended  Shelby Mcguire continue to follow the cancer screening guidelines provided by her primary healthcare providers.   RECOMMENDATIONS FOR FAMILY MEMBERS: Women in this family might be at some increased risk of developing cancer, over the general population risk, simply due to the family history of cancer. Shelby Mcguire reported that her daughter, Shelby Mcguire, was recalled recently on her mammogram.  This was ultimately determined not to be cancer. We recommended women in this family have a yearly mammogram beginning at age 37, or 58 years younger than the earliest onset of cancer, an an annual clinical breast exam, and perform monthly breast  self-exams. Women in this family should also have a gynecological exam as recommended by their primary provider. All family members should have a colonoscopy by age 78.  FOLLOW-UP: Lastly, we discussed with Shelby Mcguire that cancer genetics is a rapidly advancing field and it is possible that new genetic tests will be  appropriate for her and/or her family members in the future. We encouraged her to remain in contact with cancer genetics on an annual basis so we can update her personal and family histories and let her know of advances in cancer genetics that may benefit this family.   Our contact number was provided. Shelby Mcguire questions were answered to her satisfaction, and she knows she is welcome to call us at anytime with additional questions or concerns.   Roma Kayser, MS, [facility] Certified Genetic Counselor [email or web address]

## 2015-06-27 ENCOUNTER — Ambulatory Visit (INDEPENDENT_AMBULATORY_CARE_PROVIDER_SITE_OTHER): Payer: Medicare Other | Admitting: Physician Assistant

## 2015-06-27 VITALS — BP 128/60 | HR 75 | Temp 98.5°F | Resp 16 | Ht 64.5 in | Wt 159.8 lb

## 2015-06-27 DIAGNOSIS — J019 Acute sinusitis, unspecified: Secondary | ICD-10-CM | POA: Diagnosis not present

## 2015-06-27 MED ORDER — AMOXICILLIN-POT CLAVULANATE 875-125 MG PO TABS: 1.0000 | ORAL_TABLET | Freq: Two times a day (BID) | ORAL | Status: DC

## 2015-06-27 NOTE — Progress Notes (Signed)
06/27/2015 at 4:02 PM  Shelby Mcguire / DOB: 04/28/1946 / MRN: [id number]  The patient has Rheumatoid arthritis(714.0); Hyperlipidemia; Atypical ductal hyperplasia of breast; Genetic testing; Family history of breast cancer; Family history of ovarian cancer; and Family history of colon cancer on her problem list.  SUBJECTIVE  Shelby Mcguire is a 69 y.o. well appearing female presenting for the chief complaint of frontal sinus pain, rhonorrhea, post nasal drip and dry cough.  This all started on Tuesday and has been worsening.  She take Methotrexate for RA daily.  She has tried tylenol with mild to good relief of her pain.  She denies sick contacts and reports a long history of recurrent ABRS.       She  has a past medical history of Arthritis; Family history of breast cancer; Family history of ovarian cancer; and Family history of colon cancer.    Medications reviewed and updated by myself where necessary, and exist elsewhere in the encounter.   Shelby Mcguire has No Known Allergies. She  reports that she quit smoking about 35 years ago. She does not have any smokeless tobacco history on file. She reports that she does not drink alcohol or use illicit drugs. She  reports that she does not engage in sexual activity. The patient  has past surgical history that includes Abdominal hysterectomy; Cesarean section; and Breast surgery.  Her family history includes Alcohol abuse in her son; Brain cancer (age of onset: 72) in her sister; Breast cancer (age of onset: 21) in her sister; Cancer in her paternal uncle; Colon cancer in her maternal aunt; Diabetes in her mother; Lung cancer in her maternal aunt; Ovarian cancer (age of onset: 13) in her sister; Prostate cancer in her father.  Review of Systems  Constitutional: Negative for fever and chills.  HENT: Positive for congestion and sore throat. Negative for ear pain.   Eyes: Negative.   Respiratory: Positive for cough (dry). Negative for shortness of  breath.   Cardiovascular: Negative for chest pain.  Gastrointestinal: Negative for nausea.  Genitourinary: Negative.   Musculoskeletal: Negative for myalgias.  Skin: Negative for rash.  Neurological: Positive for headaches. Negative for dizziness.    OBJECTIVE  Her  height is 5' 4.5" (1.638 m) and weight is 159 lb 12.8 oz (72.485 kg). Her oral temperature is 98.5 F (36.9 C). Her blood pressure is 128/60 and her pulse is 75. Her respiration is 16 and oxygen saturation is 97%.  The patient's body mass index is 27.02 kg/(m^2).  Physical Exam  Constitutional: She is oriented to person, place, and time. She appears well-developed and well-nourished. No distress.  HENT:  Right Ear: Hearing, tympanic membrane, external ear and ear canal normal.  Left Ear: Hearing, tympanic membrane, external ear and ear canal normal.  Nose: Mucosal edema present. Right sinus exhibits no maxillary sinus tenderness and no frontal sinus tenderness. Left sinus exhibits no maxillary sinus tenderness and no frontal sinus tenderness.  Mouth/Throat: Uvula is midline, oropharynx is clear and moist and mucous membranes are normal.  Cardiovascular: Normal rate, regular rhythm and normal heart sounds.   Respiratory: Effort normal and breath sounds normal. She has no wheezes. She has no rales.  Neurological: She is alert and oriented to person, place, and time.  Skin: Skin is warm and dry. She is not diaphoretic.  Psychiatric: She has a normal mood and affect.    No results found for this or any previous visit (from the past 24 hour(s)).  ASSESSMENT & PLAN  Shelby Mcguire was seen today for sinusitis and cough.  Diagnoses and all orders for this visit:  Acute sinusitis, recurrence not specified, unspecified location: I have discussed the risk associated with antibiotic overuse with this patient and have advised against it.  However, patient insist that this has happened to her multiple times in the past and that her  symptoms will progress to fever, chills, and a serious infection if she is not treated.  Given this and her history of immunocompromised status I will cover for a bacterial infection.  I have advised that she continue her tylenol and add Claritin to her regimen for symptomatic relief.   -     amoxicillin-clavulanate (AUGMENTIN) 875-125 MG per tablet; Take 1 tablet by mouth 2 (two) times daily.   The patient was advised to call or come back to clinic if she does not see an improvement in symptoms, or worsens with the above plan.   Philis Fendt, MHS, PA-C [facility] 06/27/2015 4:02 PM

## 2015-07-14 NOTE — Telephone Encounter (Signed)
Error

## 2015-08-03 ENCOUNTER — Ambulatory Visit
Admission: RE | Admit: 2015-08-03 | Discharge: 2015-08-03 | Disposition: A | Discharge status: Home / Self Care | Payer: Medicare Other | Source: Ambulatory Visit | Attending: Hematology and Oncology | Admitting: Hematology and Oncology

## 2015-08-03 DIAGNOSIS — Z1231 Encounter for screening mammogram for malignant neoplasm of breast: Secondary | ICD-10-CM

## 2015-08-04 NOTE — Assessment & Plan Note (Signed)
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 These were normal.   Breast cancer Surveillance:  1. Today's breast exam did not reveal any abnormalities. 2. Breast MRI and mammogram 08/2014 Normal. I recommended continued annual mammograms and every other year MRIs.

## 2015-08-05 ENCOUNTER — Encounter: Payer: Self-pay | Admitting: Hematology and Oncology

## 2015-08-05 ENCOUNTER — Ambulatory Visit (HOSPITAL_BASED_OUTPATIENT_CLINIC_OR_DEPARTMENT_OTHER): Payer: Medicare Other | Admitting: Hematology and Oncology

## 2015-08-05 ENCOUNTER — Telehealth: Payer: Self-pay | Admitting: Hematology and Oncology

## 2015-08-05 VITALS — BP 182/71 | HR 73 | Temp 98.2°F | Resp 18 | Ht 64.5 in | Wt 157.6 lb

## 2015-08-05 DIAGNOSIS — N6091 Unspecified benign mammary dysplasia of right breast: Secondary | ICD-10-CM

## 2015-08-05 DIAGNOSIS — N62 Hypertrophy of breast: Secondary | ICD-10-CM | POA: Diagnosis present

## 2015-08-05 NOTE — Progress Notes (Signed)
Patient Care Team: Aldean Jewett, MD as PCP - General (Internal Medicine)  DIAGNOSIS: High-risk for breast cancer.  CHIEF COMPLIANT: Follow-up of ADH  INTERVAL HISTORY: Shelby Mcguire is a 69 year old with above-mentioned history of ADH but high risk of breast cancer who is here for annual follow-up. She's been doing quite well without any health issues or concerns. Denies any lumps or nodules in the breast. She underwent a mammogram yesterday which was normal. He is here today to discuss the mammogram report and for annual follow-up.  REVIEW OF SYSTEMS:   Constitutional: Denies fevers, chills or abnormal weight loss Eyes: Denies blurriness of vision Ears, nose, mouth, throat, and face: Denies mucositis or sore throat Respiratory: Denies cough, dyspnea or wheezes Cardiovascular: Denies palpitation, chest discomfort or lower extremity swelling Gastrointestinal:  Denies nausea, heartburn or change in bowel habits Skin: Denies abnormal skin rashes Lymphatics: Denies new lymphadenopathy or easy bruising Neurological:Denies numbness, tingling or new weaknesses Behavioral/Psych: Mood is stable, no new changes  Breast:  denies any pain or lumps or nodules in either breasts All other systems were reviewed with the patient and are negative.  I have reviewed the past medical history, past surgical history, social history and family history with the patient and they are unchanged from previous note.  ALLERGIES:  has No Known Allergies.  MEDICATIONS:  Current Outpatient Prescriptions  Medication Sig Dispense Refill  . folic acid (FOLVITE) 1 MG tablet Take 3 mg by mouth daily.    . hydrocortisone 2.5 % cream     . leflunomide (ARAVA) 10 MG tablet Take 10 mg by mouth daily.    . methotrexate (RHEUMATREX) 10 MG tablet Take 10 mg by mouth once a week. Caution: Chemotherapy. Protect from light.    . prednisoLONE 5 MG TABS tablet Take by mouth as needed.     No current facility-administered  medications for this visit.    PHYSICAL EXAMINATION: ECOG PERFORMANCE STATUS: 0 - Asymptomatic  Filed Vitals:   08/05/15 0855  BP: 182/71  Pulse: 73  Temp: 98.2 F (36.8 C)  Resp: 18   Filed Weights   08/05/15 0855  Weight: 157 lb 9.6 oz (71.487 kg)    GENERAL:alert, no distress and comfortable SKIN: skin color, texture, turgor are normal, no rashes or significant lesions EYES: normal, Conjunctiva are pink and non-injected, sclera clear OROPHARYNX:no exudate, no erythema and lips, buccal mucosa, and tongue normal  NECK: supple, thyroid normal size, non-tender, without nodularity LYMPH:  no palpable lymphadenopathy in the cervical, axillary or inguinal LUNGS: clear to auscultation and percussion with normal breathing effort HEART: regular rate & rhythm and no murmurs and no lower extremity edema ABDOMEN:abdomen soft, non-tender and normal bowel sounds Musculoskeletal:no cyanosis of digits and no clubbing  NEURO: alert & oriented x 3 with fluent speech, no focal motor/sensory deficits BREAST: No palpable masses or nodules in either right or left breasts. No palpable axillary supraclavicular or infraclavicular adenopathy no breast tenderness or nipple discharge. (exam performed in the presence of a chaperone)  LABORATORY DATA:  I have reviewed the data as listed   Chemistry   No results found for: NA, K, CL, CO2, BUN, CREATININE, GLU No results found for: CALCIUM, ALKPHOS, AST, ALT, BILITOT     No results found for: WBC, HGB, HCT, MCV, PLT, NEUTROABS   RADIOGRAPHIC STUDIES: I have personally reviewed the radiology reports and agreed with their findings. Mm Screening Breast Tomo Bilateral  08/04/2015   CLINICAL DATA:  Screening.  EXAM: DIGITAL SCREENING  BILATERAL MAMMOGRAM WITH 3D TOMO WITH CAD  COMPARISON:  Previous exam(s).  ACR Breast Density Category c: The breast tissue is heterogeneously dense, which may obscure small masses.  FINDINGS: There are no findings suspicious  for malignancy. Images were processed with CAD.  IMPRESSION: No mammographic evidence of malignancy. A result letter of this screening mammogram will be mailed directly to the patient.  RECOMMENDATION: Screening mammogram in one year. (Code:SM-B-01Y)  BI-RADS CATEGORY  1: Negative.   Electronically Signed   By: Lovey Newcomer M.D.   On: 08/04/2015 11:11     ASSESSMENT & PLAN:  Atypical ductal hyperplasia of right breast Right breast Atypical ductal hyperplasia with very high risk family history of breast cancer: I reviewed the MRI and a mammogram reports. These were normal.   Breast cancer Surveillance:  1. Today's breast exam did not reveal any abnormalities. 2. Breast MRI and mammogram 08/2014 Normal. I recommended continued annual mammograms and every other year MRIs.   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 Because of high risk breast cancer given her extensive family history we are performing breast MRIs every other year.  Return to clinic in 1 year for breast exam and review of scans patient is planning to go to [place] to spend some time with her son. I discussed with her the importance of preventive health including flu vaccines.  Orders Placed This Encounter  Procedures  . MR Breast Bilateral W Wo Contrast    Standing Status: Future     Number of Occurrences:      Standing Expiration Date: 10/04/2016    Order Specific Question:  Reason for Exam (SYMPTOM  OR DIAGNOSIS REQUIRED)    Answer:  High risk breast cancer (>20% risk) BRCA2 VUS    Order Specific Question:  Preferred imaging location?    Answer:  [facility]    Order Specific Question:  Does the patient have a pacemaker or implanted devices?    Answer:  No    Order Specific Question:  What is the patient's sedation requirement?    Answer:  No Sedation  . 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    Answer:  [facility]   The patient has a good understanding of the overall plan. she agrees with it. she will call with any problems that may develop before the next visit here.   Rulon Eisenmenger, MD

## 2015-08-05 NOTE — Telephone Encounter (Signed)
Gave avs & calendar for September/October. °

## 2015-08-31 ENCOUNTER — Ambulatory Visit: Payer: Medicare Other | Admitting: Hematology and Oncology

## 2016-01-09 IMAGING — MG MM SCREENING BREAST TOMO BILATERAL
8 series · 8 of 24 positions shown · non-contrast
Comparison: Previous exam(s).

CLINICAL DATA: Screening.

EXAM:
DIGITAL SCREENING BILATERAL MAMMOGRAM WITH 3D TOMO WITH CAD

[R MLO]
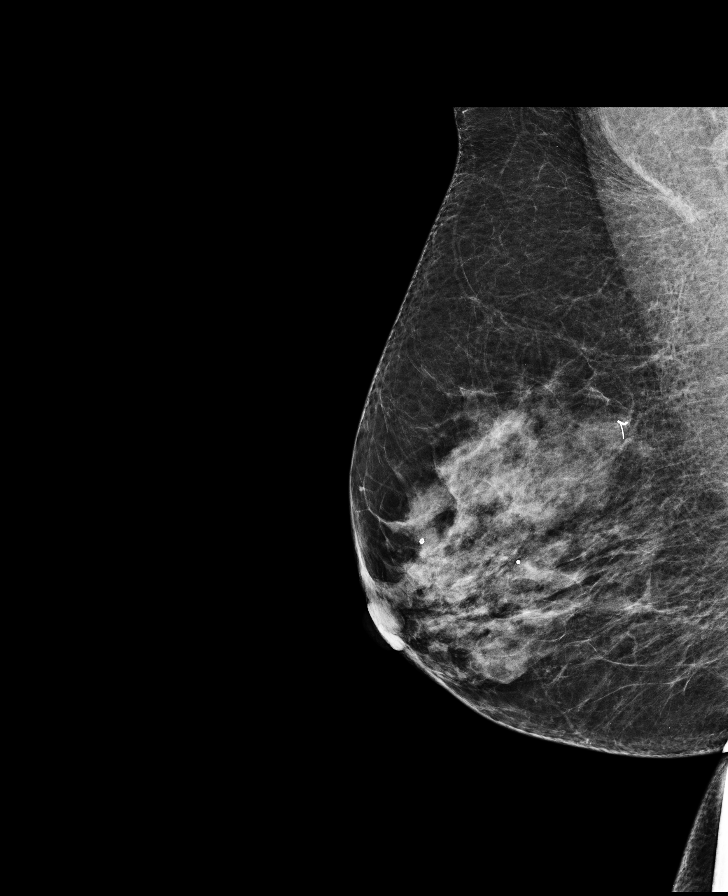

[R CC]
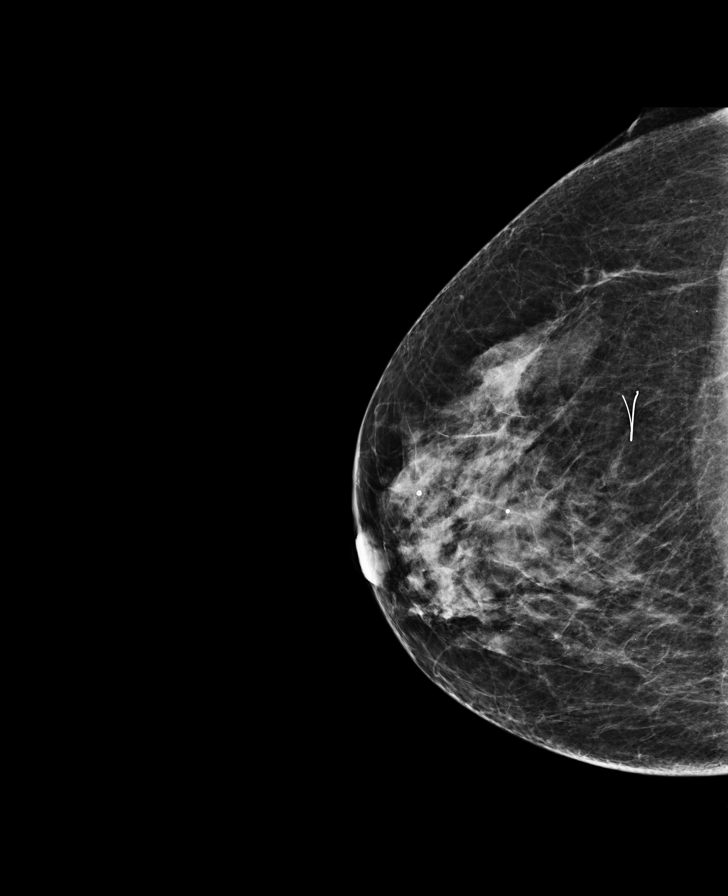

[L MLO]
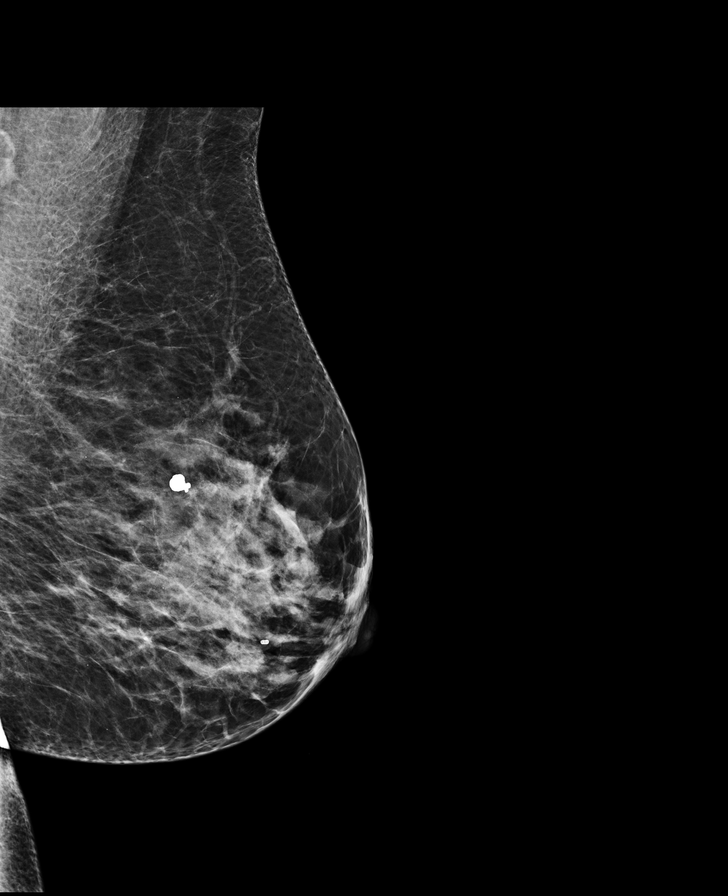

[L CC]
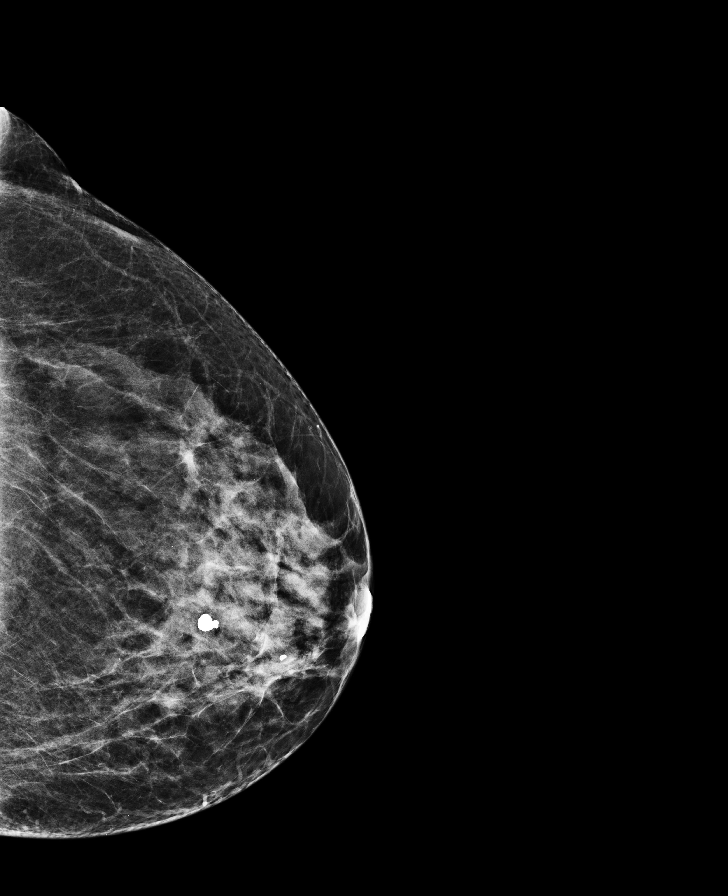

[R MLO tomo · tomo slice 33/64.0]
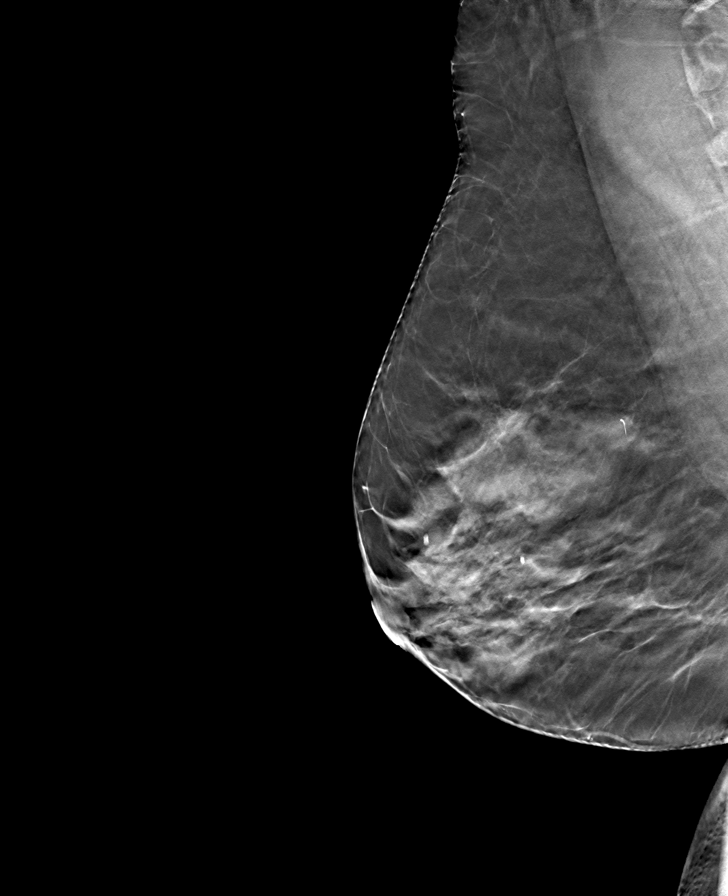

[L MLO tomo · tomo slice 33/66.0]
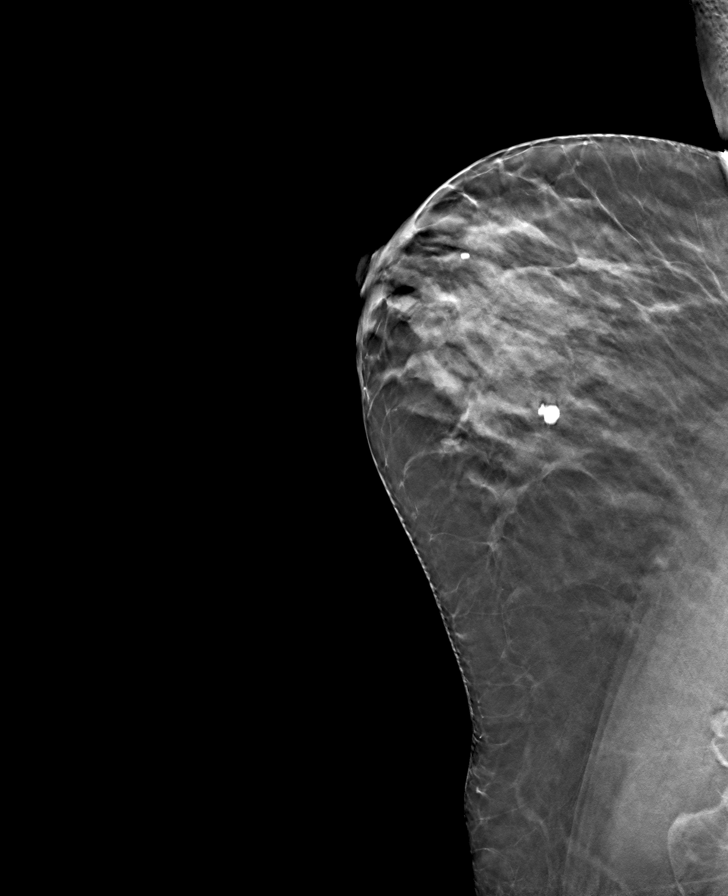

[L CC tomo · tomo slice 32/63.0]
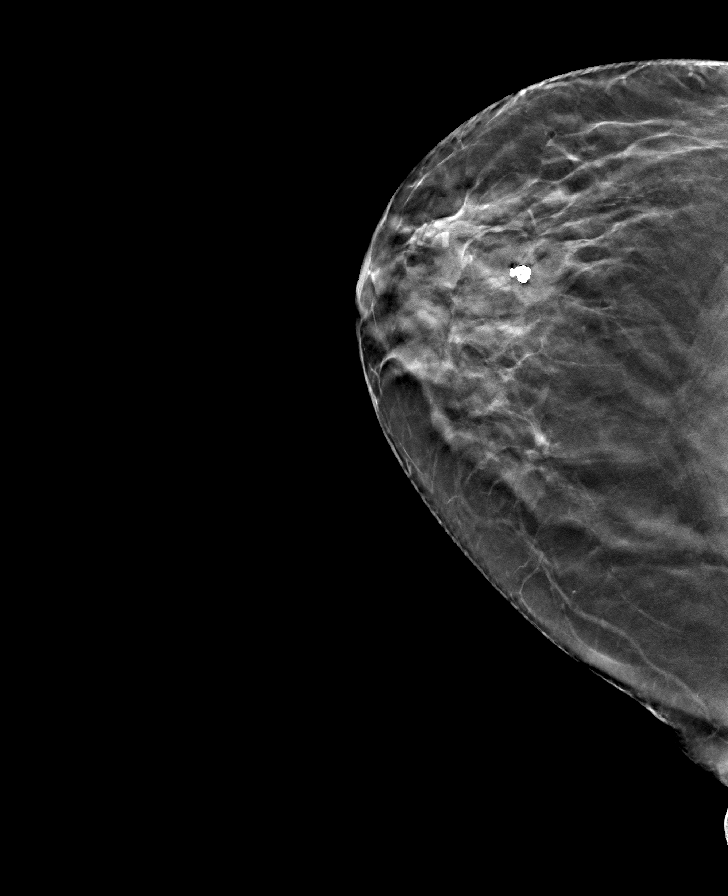

[R CC tomo · tomo slice 32/63.0]
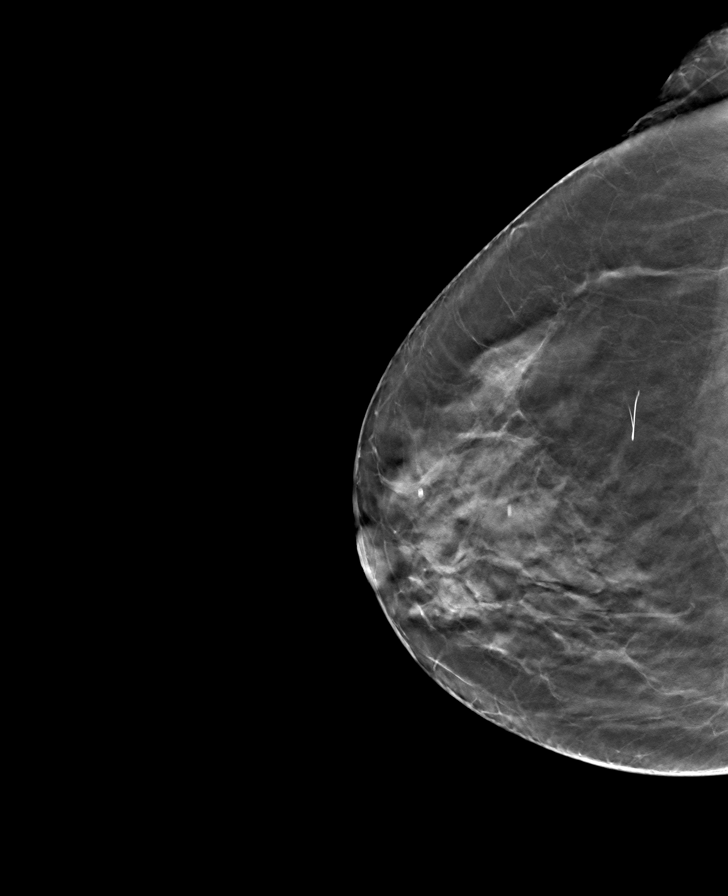

[8 of 24 positions shown; findings below may reference images not displayed]

ACR Breast Density Category c: The breast tissue is heterogeneously
dense, which may obscure small masses.
FINDINGS: There are no findings suspicious for malignancy. Images were
processed with CAD.
IMPRESSION: No mammographic evidence of malignancy. A result letter of this
screening mammogram will be mailed directly to the patient.

RECOMMENDATION:
Screening mammogram in one year. (Code:OA-G-1SS)

BI-RADS CATEGORY  1: Negative.

## 2016-05-31 ENCOUNTER — Ambulatory Visit
Admission: RE | Admit: 2016-05-31 | Discharge: 2016-05-31 | Disposition: A | Discharge status: Home / Self Care | Payer: Medicare Other | Source: Ambulatory Visit | Attending: Internal Medicine | Admitting: Internal Medicine

## 2016-05-31 ENCOUNTER — Other Ambulatory Visit: Payer: Self-pay | Admitting: Internal Medicine

## 2016-05-31 DIAGNOSIS — J40 Bronchitis, not specified as acute or chronic: Secondary | ICD-10-CM

## 2016-08-02 ENCOUNTER — Other Ambulatory Visit: Payer: Self-pay | Admitting: *Deleted

## 2016-08-02 DIAGNOSIS — E785 Hyperlipidemia, unspecified: Secondary | ICD-10-CM

## 2016-08-02 DIAGNOSIS — Z8041 Family history of malignant neoplasm of ovary: Secondary | ICD-10-CM

## 2016-08-02 DIAGNOSIS — Z803 Family history of malignant neoplasm of breast: Secondary | ICD-10-CM

## 2016-08-02 DIAGNOSIS — N6091 Unspecified benign mammary dysplasia of right breast: Secondary | ICD-10-CM

## 2016-08-02 DIAGNOSIS — Z800 Family history of malignant neoplasm of digestive organs: Secondary | ICD-10-CM

## 2016-08-02 DIAGNOSIS — Z01812 Encounter for preprocedural laboratory examination: Secondary | ICD-10-CM

## 2016-08-03 ENCOUNTER — Other Ambulatory Visit: Payer: Self-pay | Admitting: Hematology and Oncology

## 2016-08-03 ENCOUNTER — Other Ambulatory Visit: Payer: Self-pay | Admitting: *Deleted

## 2016-08-03 ENCOUNTER — Ambulatory Visit
Admission: RE | Admit: 2016-08-03 | Discharge: 2016-08-03 | Disposition: A | Discharge status: Home / Self Care | Payer: Medicare Other | Source: Ambulatory Visit | Attending: Hematology and Oncology | Admitting: Hematology and Oncology

## 2016-08-03 ENCOUNTER — Other Ambulatory Visit (HOSPITAL_BASED_OUTPATIENT_CLINIC_OR_DEPARTMENT_OTHER): Payer: Medicare Other

## 2016-08-03 DIAGNOSIS — N6091 Unspecified benign mammary dysplasia of right breast: Secondary | ICD-10-CM

## 2016-08-03 DIAGNOSIS — Z800 Family history of malignant neoplasm of digestive organs: Secondary | ICD-10-CM

## 2016-08-03 DIAGNOSIS — Z803 Family history of malignant neoplasm of breast: Secondary | ICD-10-CM

## 2016-08-03 DIAGNOSIS — N62 Hypertrophy of breast: Secondary | ICD-10-CM

## 2016-08-03 DIAGNOSIS — E785 Hyperlipidemia, unspecified: Secondary | ICD-10-CM

## 2016-08-03 DIAGNOSIS — Z8041 Family history of malignant neoplasm of ovary: Secondary | ICD-10-CM

## 2016-08-03 LAB — CBC WITH DIFFERENTIAL/PLATELET
BASO%: 1.1 % (ref 0.0–2.0)
BASOS ABS: 0.1 10*3/uL (ref 0.0–0.1)
EOS ABS: 0.1 10*3/uL (ref 0.0–0.5)
EOS%: 1.5 % (ref 0.0–7.0)
HCT: 42.5 % (ref 34.8–46.6)
HEMOGLOBIN: 13.9 g/dL (ref 11.6–15.9)
LYMPH%: 21.2 % (ref 14.0–49.7)
MCH: 29.4 pg (ref 25.1–34.0)
MCHC: 32.6 g/dL (ref 31.5–36.0)
MCV: 90.1 fL (ref 79.5–101.0)
MONO#: 0.6 10*3/uL (ref 0.1–0.9)
MONO%: 8.6 % (ref 0.0–14.0)
NEUT%: 67.6 % (ref 38.4–76.8)
NEUTROS ABS: 4.4 10*3/uL (ref 1.5–6.5)
PLATELETS: 229 10*3/uL (ref 145–400)
RBC: 4.72 10*6/uL (ref 3.70–5.45)
RDW: 13.8 % (ref 11.2–14.5)
WBC: 6.5 10*3/uL (ref 3.9–10.3)
lymph#: 1.4 10*3/uL (ref 0.9–3.3)

## 2016-08-03 LAB — COMPREHENSIVE METABOLIC PANEL
ALBUMIN: 3.5 g/dL (ref 3.5–5.0)
ALK PHOS: 82 U/L (ref 40–150)
ALT: 14 U/L (ref 0–55)
ANION GAP: 8 meq/L (ref 3–11)
AST: 16 U/L (ref 5–34)
BILIRUBIN TOTAL: 0.34 mg/dL (ref 0.20–1.20)
BUN: 15.6 mg/dL (ref 7.0–26.0)
CO2: 27 mEq/L (ref 22–29)
Calcium: 9.7 mg/dL (ref 8.4–10.4)
Chloride: 104 mEq/L (ref 98–109)
Creatinine: 0.7 mg/dL (ref 0.6–1.1)
EGFR: 86 mL/min/{1.73_m2} — AB (ref >90)
Glucose: 77 mg/dl (ref 70–140)
Potassium: 4.7 mEq/L (ref 3.5–5.1)
Sodium: 139 mEq/L (ref 136–145)
TOTAL PROTEIN: 6.7 g/dL (ref 6.4–8.3)

## 2016-08-10 ENCOUNTER — Ambulatory Visit: Payer: Medicare Other | Admitting: Hematology and Oncology

## 2016-08-11 ENCOUNTER — Ambulatory Visit (HOSPITAL_COMMUNITY): Admission: RE | Admit: 2016-08-11 | Payer: Medicare Other | Source: Ambulatory Visit

## 2016-08-13 ENCOUNTER — Ambulatory Visit (HOSPITAL_COMMUNITY)
Admission: RE | Admit: 2016-08-13 | Discharge: 2016-08-13 | Disposition: A | Discharge status: Home / Self Care | Payer: Medicare Other | Source: Ambulatory Visit | Attending: Hematology and Oncology | Admitting: Hematology and Oncology

## 2016-08-13 DIAGNOSIS — N6091 Unspecified benign mammary dysplasia of right breast: Secondary | ICD-10-CM | POA: Diagnosis present

## 2016-08-13 MED ORDER — GADOBENATE DIMEGLUMINE 529 MG/ML IV SOLN
13.0000 mL | Freq: Once | INTRAVENOUS | Status: AC | PRN
  Administered 2016-08-13: 13 mL via INTRAVENOUS

## 2016-08-16 ENCOUNTER — Encounter: Payer: Self-pay | Admitting: Hematology and Oncology

## 2016-08-16 ENCOUNTER — Ambulatory Visit (HOSPITAL_BASED_OUTPATIENT_CLINIC_OR_DEPARTMENT_OTHER): Payer: Medicare Other | Admitting: Hematology and Oncology

## 2016-08-16 DIAGNOSIS — N6091 Unspecified benign mammary dysplasia of right breast: Secondary | ICD-10-CM

## 2016-08-16 DIAGNOSIS — Z803 Family history of malignant neoplasm of breast: Secondary | ICD-10-CM

## 2016-08-16 MED ORDER — METHOTREXATE SODIUM 2.5 MG PO TABS: 20.0000 mg | ORAL_TABLET | ORAL | Status: AC

## 2016-08-16 NOTE — Assessment & Plan Note (Signed)
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 These were normal.   Breast cancer Surveillance:  1. Today's breast exam did not reveal any abnormalities. 2. Breast MRI and mammogram 08/2014 Normal. I recommended continued annual mammograms and every other year MRIs.   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  Survivorship: Discussed the importance of physical exercise in decreasing the likelihood of breast cancer recurrence. Recommended 30 mins daily 6 days a week of either brisk walking or cycling or swimming. Encouraged patient to eat more fruits and vegetables and decrease red meat.   Return to clinic in 1 year for breast exam and review of scans

## 2016-08-16 NOTE — Progress Notes (Signed)
Patient Care Team: Aldean Jewett, MD as PCP - General (Internal Medicine)  DIAGNOSIS: High-risk for breast cancer.  CHIEF COMPLIANT: Follow-up of ADH  INTERVAL HISTORY: Shelby Mcguire is a 70 year old with above-mentioned history of ADH but high risk of breast cancer who is here for annual follow-up. She's been doing quite well without any health issues or concerns. Denies any lumps or nodules in the breast. She underwent a MRI breast yesterday which was normal. He is here today to discuss the MRI report and for annual follow-up. She is exercising regularly and has lost 13 pounds since last year. She is very excited about going on a cruise this weekend.  REVIEW OF SYSTEMS:   Constitutional: Denies fevers, chills or abnormal weight loss Eyes: Denies blurriness of vision Ears, nose, mouth, throat, and face: Denies mucositis or sore throat Respiratory: Denies cough, dyspnea or wheezes Cardiovascular: Denies palpitation, chest discomfort Gastrointestinal:  Denies nausea, heartburn or change in bowel habits Skin: Denies abnormal skin rashes Lymphatics: Denies new lymphadenopathy or easy bruising Neurological:Denies numbness, tingling or new weaknesses Behavioral/Psych: Mood is stable, no new changes  Extremities: No lower extremity edema Breast:  denies any pain or lumps or nodules in either breasts All other systems were reviewed with the patient and are negative.  I have reviewed the past medical history, past surgical history, social history and family history with the patient and they are unchanged from previous note.  ALLERGIES:  has No Known Allergies.  MEDICATIONS:  Current Outpatient Prescriptions  Medication Sig Dispense Refill  . folic acid (FOLVITE) 1 MG tablet Take 3 mg by mouth daily.    . hydrocortisone 2.5 % cream     . leflunomide (ARAVA) 10 MG tablet Take 10 mg by mouth daily.    . methotrexate (RHEUMATREX) 10 MG tablet Take 10 mg by mouth once a week.  Caution: Chemotherapy. Protect from light.    . prednisoLONE 5 MG TABS tablet Take by mouth as needed.     No current facility-administered medications for this visit.     PHYSICAL EXAMINATION: ECOG PERFORMANCE STATUS: 0 - Asymptomatic  Vitals:   08/16/16 0844  BP: (!) 151/54  Pulse: 75  Resp: 18  Temp: 98.5 F (36.9 C)   Filed Weights   08/16/16 0844  Weight: 144 lb 6.4 oz (65.5 kg)    GENERAL:alert, no distress and comfortable SKIN: skin color, texture, turgor are normal, no rashes or significant lesions EYES: normal, Conjunctiva are pink and non-injected, sclera clear OROPHARYNX:no exudate, no erythema and lips, buccal mucosa, and tongue normal  NECK: supple, thyroid normal size, non-tender, without nodularity LYMPH:  no palpable lymphadenopathy in the cervical, axillary or inguinal LUNGS: clear to auscultation and percussion with normal breathing effort HEART: regular rate & rhythm and no murmurs and no lower extremity edema ABDOMEN:abdomen soft, non-tender and normal bowel sounds MUSCULOSKELETAL:no cyanosis of digits and no clubbing  NEURO: alert & oriented x 3 with fluent speech, no focal motor/sensory deficits EXTREMITIES: No lower extremity edema BREAST: No palpable masses or nodules in either right or left breasts. No palpable axillary supraclavicular or infraclavicular adenopathy no breast tenderness or nipple discharge. (exam performed in the presence of a chaperone)  LABORATORY DATA:  I have reviewed the data as listed   Chemistry      Component Value Date/Time   NA 139 08/03/2016 1009   K 4.7 08/03/2016 1009   CO2 27 08/03/2016 1009   BUN 15.6 08/03/2016 1009   CREATININE 0.7 08/03/2016  1009      Component Value Date/Time   CALCIUM 9.7 08/03/2016 1009   ALKPHOS 82 08/03/2016 1009   AST 16 08/03/2016 1009   ALT 14 08/03/2016 1009   BILITOT 0.34 08/03/2016 1009       Lab Results  Component Value Date   WBC 6.5 08/03/2016   HGB 13.9 08/03/2016    HCT 42.5 08/03/2016   MCV 90.1 08/03/2016   PLT 229 08/03/2016   NEUTROABS 4.4 08/03/2016     ASSESSMENT & PLAN:  Atypical ductal hyperplasia of right breast Right breast Atypical ductal hyperplasia with very high risk family history of breast cancer: I reviewed the MRI and a mammogram reports. These were normal.   Breast cancer Surveillance:  1. Today's breast exam did not reveal any abnormalities. 2. Breast MRI and mammogram 08/2016 Normal. I recommended continued annual mammograms and every other year MRIs.   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  Survivorship: Discussed the importance of physical exercise in decreasing the likelihood of breast cancer recurrence. Recommended 30 mins daily 6 days a week of either brisk walking or cycling or swimming. Encouraged patient to eat more fruits and vegetables and decrease red meat.   Return to clinic in 1 year for breast exam and review of scans     No orders of the defined types were placed in this encounter.  The patient has a good understanding of the overall plan. she agrees with it. she will call with any problems that may develop before the next visit here.   Rulon Eisenmenger, MD 08/16/16

## 2016-10-26 ENCOUNTER — Ambulatory Visit: Payer: Medicare Other | Admitting: Podiatry

## 2016-11-01 ENCOUNTER — Ambulatory Visit (INDEPENDENT_AMBULATORY_CARE_PROVIDER_SITE_OTHER): Payer: Medicare Other | Admitting: Podiatry

## 2016-11-01 ENCOUNTER — Encounter: Payer: Self-pay | Admitting: Podiatry

## 2016-11-01 VITALS — BP 134/60 | HR 82 | Resp 18

## 2016-11-01 DIAGNOSIS — M79672 Pain in left foot: Secondary | ICD-10-CM

## 2016-11-01 DIAGNOSIS — M79671 Pain in right foot: Secondary | ICD-10-CM | POA: Diagnosis not present

## 2016-11-01 DIAGNOSIS — L84 Corns and callosities: Secondary | ICD-10-CM

## 2016-11-03 NOTE — Progress Notes (Signed)
Subjective: 70 year old female presents the office they for concerns of calluses between her fourth and fifth toes bilaterally with left side worse than the right. She said the areas painful pressure in shoe gear. No recent treatment. No swelling or redness or any drainage or pus. Denies any systemic complaints such as fevers, chills, nausea, vomiting. No acute changes since last appointment, and no other complaints at this time.   Objective: AAO x3, NAD Hyperkeratotic lesions in the sulces of the fourth interspaces bilaterally. Upon debridement there is no underlying ulceration, drainage or any signs of infection. There is no edema. Adductovarus the toes are present. There is no other areas of tenderness except for the hyperkeratotic lesions. No edema, erythema, increase in warmth to bilateral lower extremities.  No open lesions or pre-ulcerative lesions.  No pain with calf compression, swelling, warmth, erythema  Assessment: Hyperkeratotic lesions 2  Plan: -All treatment options discussed with the patient including all alternatives, risks, complications.  -Lesions were sharply debrided without complications or bleeding. Offloading pads were dispensed. Discussed likely reoccurrence. Discussed possible surgical intervention the future if symptoms continue however we will continue with conservative treatment. -Patient encouraged to call the office with any questions, concerns, change in symptoms.   Celesta Gentile, DPM

## 2016-11-04 DIAGNOSIS — K219 Gastro-esophageal reflux disease without esophagitis: Secondary | ICD-10-CM | POA: Insufficient documentation

## 2016-12-15 ENCOUNTER — Ambulatory Visit: Payer: Medicare Other | Admitting: Allergy & Immunology

## 2017-01-04 ENCOUNTER — Ambulatory Visit (INDEPENDENT_AMBULATORY_CARE_PROVIDER_SITE_OTHER): Payer: Medicare Other | Admitting: Allergy & Immunology

## 2017-01-04 ENCOUNTER — Encounter: Payer: Self-pay | Admitting: Allergy & Immunology

## 2017-01-04 VITALS — BP 150/68 | HR 79 | Temp 98.6°F | Resp 17 | Ht 63.75 in | Wt 153.2 lb

## 2017-01-04 DIAGNOSIS — J329 Chronic sinusitis, unspecified: Secondary | ICD-10-CM | POA: Diagnosis not present

## 2017-01-04 DIAGNOSIS — D899 Disorder involving the immune mechanism, unspecified: Secondary | ICD-10-CM

## 2017-01-04 DIAGNOSIS — J3089 Other allergic rhinitis: Secondary | ICD-10-CM

## 2017-01-04 DIAGNOSIS — J309 Allergic rhinitis, unspecified: Secondary | ICD-10-CM | POA: Insufficient documentation

## 2017-01-04 DIAGNOSIS — D849 Immunodeficiency, unspecified: Secondary | ICD-10-CM

## 2017-01-04 NOTE — Progress Notes (Signed)
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 Chronic rhinitis (weeds, trees, molds, and dust mites) - Testing was positive to: weeds, trees, molds, and dust mites - Avoidance measures discussed.  - Start Flonase 1-2 sprays per nostril nightly. - Start Xyzal 5mg  tablet nightly.  - We can consider allergy shots if there is no improvement at the next visit.   2. Immunosuppressed state secondary to methotrexate therapy  - There is no need for additional workup at this time - Aside from the sinus infections, there are no obvious signs of an immunodeficiency.   3. Return in about 6 months (around 07/04/2017).    Subjective:   Sharyn Shay is a 71 y.o. female presenting today for evaluation of  Chief Complaint  Patient presents with  . Sinus Problem  . Allergies    Kenzie Trescott has a history of the following: Patient Active Problem List   Diagnosis Date Noted  . Family history of breast cancer   . Family history of ovarian cancer   . Family history of colon cancer   . Genetic testing 02/12/2015  . Rheumatoid arthritis ([place]) 09/05/2012  . Hyperlipidemia 09/05/2012  . Atypical ductal hyperplasia of right breast 09/05/2012    History obtained from: chart review and patient.  Fusae Eller was referred by Myrtis Ser, MD.     Nanita is a 71 y.o. female presenting for recurrent sinus infections. She has a history of arthritis and is on MTX weekly for years. She feels that her immune system is compromised. She gets sinus infections around two times per year. She does have genital herpes as well for the past 40 years. She does get antibiotics for her sinus infections but this only occurs twice per year. She denies itchy watery eyes but she  does sneeze but only two times at once. She has crusting around her nose and eyes every morning. She endorses mucous production throughout the year. She does have throat clearing throughout the visit today. She does get mucous in her eyes occasionally as well. She does not use Flonase. She moved here around seven years ago from [place].   She does have a history of reflux, but has not started her medication chart by her doctor. She does have a history of rheumatoid arthritis diagnosed in 1997. She has been RF positive and CCP positive. She has prednisone which she uses as needed for flares, but otherwise her symptoms are well-controlled on methotrexate.  Otherwise, there is no history of other atopic diseases, including asthma, drug allergies, food allergies, stinging insect allergies, or urticaria. There is no significant infectious history. Vaccinations are up to date.    Past Medical History: Patient Active Problem List   Diagnosis Date Noted  . Family history of breast cancer   . Family history of ovarian cancer   . Family history of colon cancer   . Genetic testing 02/12/2015  . Rheumatoid arthritis ([place]) 09/05/2012  . Hyperlipidemia 09/05/2012  . 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 Always use your most recent med list.  FISH OIL CONCENTRATE 300 MG Caps Take 3 capsules by mouth daily.   fluticasone 50 MCG/ACT nasal spray Commonly known as:  FLONASE USE 2 SPRAYS IN EACH NOSTRIL ONCE DAILY   folic acid 1 MG tablet Commonly known as:  FOLVITE Take 3 mg by mouth daily.   hydrocortisone 2.5 % cream   L-Lysine 500 MG Caps Take 2 capsules by mouth daily.   methotrexate 2.5 MG tablet Take 8 tablets (20 mg total) by mouth once a week. Caution: Chemotherapy. Protect from light.   MILK THISTLE EXTRACT PO Take 250 mg by  mouth 3 (three) times daily.   predniSONE 5 MG tablet Commonly known as:  DELTASONE   Red Yeast Rice Extract 600 MG Caps Take 3 each by mouth daily.   Turmeric Curcumin 500 MG Caps Take 500 mg by mouth 2 (two) times daily.   vitamin C 1000 MG tablet Take 1,000 mg by mouth daily.       Birth History: non-contributory.   Developmental History: non-contributory.   Past Surgical History: Past Surgical History:  Procedure Laterality Date  . ABDOMINAL HYSTERECTOMY    . BREAST SURGERY    . CESAREAN SECTION    . TONSILLECTOMY       Family History: Family History  Problem Relation Age of Onset  . Diabetes Mother   . Prostate cancer Father     dx late 69s  . Breast cancer Sister 83  . Asthma Sister   . Colon cancer Maternal Aunt     dx in her late 67s  . Cancer Paternal Uncle   . Ovarian cancer Sister 13  . Brain cancer Sister 1  . Alcohol abuse Son   . Lung cancer Maternal Aunt   . Allergic rhinitis Neg Hx   . Angioedema Neg Hx   . Eczema Neg Hx   . Immunodeficiency Neg Hx   . Urticaria Neg Hx      Social History: Rafael lives at home with her daughter and her family. She lives in a 5-year-old home with runs throughout the home. There is no mildew roach infestation. They have gas heating and central cooling. There are no animals inside or outside the home. She does not have dust mite coverings on her bedding. 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 She is living down here for 7 years now. She did smoke for a short period of time, but quit 37 years ago.   Review of Systems: a 14-point review of systems is pertinent for what is mentioned in HPI.  Otherwise, all other systems were negative. Constitutional: negative other than that listed in the HPI Eyes: negative other than that listed in the HPI Ears, nose, mouth, throat, and face: negative other than that listed in the HPI Respiratory: negative other than that  listed in the HPI Cardiovascular: negative other than that listed in the HPI Gastrointestinal: negative other than that listed in the HPI Genitourinary: negative other than that listed in the HPI Integument: negative other than that listed in the HPI Hematologic: negative other than that listed in the HPI Musculoskeletal: negative other than that listed in the HPI Neurological: negative other than that listed in the HPI Allergy/Immunologic: negative other than that listed in the HPI    Objective:   Blood pressure (!) 150/68, pulse 79, temperature 98.6 F (37 C), temperature source Oral, resp. rate 17, height 5' 3.75" (1.619 m), weight 153 lb 3.2  oz (69.5 kg), SpO2 96 %. Body mass index is 26.5 kg/m.   Physical Exam:  General: Alert, interactive, in no acute distress. Fit older female. Pleasant.  Eyes: No conjunctival injection present on the right, No conjunctival injection present on the left, PERRL bilaterally, No discharge on the right, No discharge on the left and No Horner-Trantas dots present Ears: Right TM pearly gray with normal light reflex, Left TM pearly gray with normal light reflex, Right TM intact without perforation and Left TM intact without perforation.  Nose/Throat: External nose within normal limits and septum midline, turbinates edematous and pale with clear discharge, post-pharynx erythematous with cobblestoning in the posterior oropharynx. Tonsils 2+ without exudates Neck: Supple without thyromegaly. Adenopathy: no enlarged lymph nodes appreciated in the anterior cervical, occipital, axillary, epitrochlear, inguinal, or popliteal regions Lungs: Clear to auscultation without wheezing, rhonchi or rales. No increased work of breathing. CV: Normal S1/S2, no murmurs. Capillary refill <2 seconds.  Abdomen: Nondistended, nontender. No guarding or rebound tenderness. Bowel sounds present in all fields and hypoactive  Skin: Warm and dry, without lesions or  rashes. Extremities:  No clubbing, cyanosis or edema. Neuro:   Grossly intact. No focal deficits appreciated. Responsive to questions.  Diagnostic studies:   Allergy Studies:   Indoor/Outdoor Percutaneous Adult Environmental Panel: positive to rough marsh elder, maple, Candida, Df mite and Dp mites. Otherwise negative with adequate controls.    Salvatore Marvel, MD [facility]

## 2017-01-04 NOTE — Patient Instructions (Addendum)
1. Chronic rhinitis - Testing was positive to: weeds, trees, molds, and dust mites - Avoidance measures discussed. - Start Flonase 1-2 sprays per nostril nightly. - Start Xyzal 5mg  tablet nightly.  - We can consider allergy shots if there is no improvement at the next visit.   2. Immunosuppressed state secondary to methotrexate - There is no need for additional workup at this time - Aside from the sinus infections, there are no obvious signs of an immunodeficiency.   3. Return in about 6 months (around 07/04/2017).  Please inform us of any Emergency Department visits, hospitalizations, or changes in symptoms. Call us before going to the ED for breathing or allergy symptoms since we might be able to fit you in for a sick visit. Feel free to contact us anytime with any questions, problems, or concerns.  It was a pleasure to meet you today! Best wishes in the [place] Year!   Websites that have reliable patient information: 1. American Academy of Asthma, Allergy, and Immunology: www.aaaai.org 2. Food Allergy Research and Education (FARE): foodallergy.org 3. Mothers of Asthmatics: http://www.asthmacommunitynetwork.org 4. American College of Allergy, Asthma, and Immunology: www.acaai.org  Reducing Pollen Exposure  The American Academy of Allergy, Asthma and Immunology suggests the following steps to reduce your exposure to pollen during allergy seasons.    1. Do not hang sheets or clothing out to dry; pollen may collect on these items. 2. Do not mow lawns or spend time around freshly cut grass; mowing stirs up pollen. 3. Keep windows closed at night.  Keep car windows closed while driving. 4. Minimize morning activities outdoors, a time when pollen counts are usually at their highest. 5. Stay indoors as much as possible when pollen counts or humidity is high and on windy days when pollen tends to remain in the air longer. 6. Use air conditioning when possible.  Many air conditioners have filters  that trap the pollen spores. 7. Use a HEPA room air filter to remove pollen form the indoor air you breathe.  Control of Mold Allergen  Mold and fungi can grow on a variety of surfaces provided certain temperature and moisture conditions exist.  Outdoor molds grow on plants, decaying vegetation and soil.  The major outdoor mold, Alternaria and Cladosporium, are found in very high numbers during hot and dry conditions.  Generally, a late Summer - Fall peak is seen for common outdoor fungal spores.  Rain will temporarily lower outdoor mold spore count, but counts rise rapidly when the rainy period ends.  The most important indoor molds are Aspergillus and Penicillium.  Dark, humid and poorly ventilated basements are ideal sites for mold growth.  The next most common sites of mold growth are the bathroom and the kitchen.  Outdoor [employer] 1. Use air conditioning and keep windows closed 2. Avoid exposure to decaying vegetation. 3. Avoid leaf raking. 4. Avoid grain handling. 5. Consider wearing a face mask if working in moldy areas.  Indoor Mold Control 1. Maintain humidity below 50%. 2. Clean washable surfaces with 5% bleach solution. 3. Remove sources e.g. contaminated carpets.  Control of House Dust Mite Allergen    House dust mites play a major role in allergic asthma and rhinitis.  They occur in environments with high humidity wherever human skin, the food for dust mites is found. High levels have been detected in dust obtained from mattresses, pillows, carpets, upholstered furniture, bed covers, clothes and soft toys.  The principal allergen of the house dust mite is found  in its feces.  A gram of dust may contain 1,000 mites and 250,000 fecal particles.  Mite antigen is easily measured in the air during house cleaning activities.    1. Encase mattresses, including the box spring, and pillow, in an air tight cover.  Seal the zipper end of the encased mattresses with wide adhesive  tape. 2. Wash the bedding in water of 130 degrees Farenheit weekly.  Avoid cotton comforters/quilts and flannel bedding: the most ideal bed covering is the dacron comforter. 3. Remove all upholstered furniture from the bedroom. 4. Remove carpets, carpet padding, rugs, and non-washable window drapes from the bedroom.  Wash drapes weekly or use plastic window coverings. 5. Remove all non-washable stuffed toys from the bedroom.  Wash stuffed toys weekly. 6. Have the room cleaned frequently with a vacuum cleaner and a damp dust-mop.  The patient should not be in a room which is being cleaned and should wait 1 hour after cleaning before going into the room. 7. Close and seal all heating outlets in the bedroom.  Otherwise, the room will become filled with dust-laden air.  An electric heater can be used to heat the room. 8. Reduce indoor humidity to less than 50%.  Do not use a humidifier.

## 2017-05-29 ENCOUNTER — Other Ambulatory Visit: Payer: Self-pay | Admitting: Vascular Surgery

## 2017-05-29 ENCOUNTER — Encounter: Payer: Self-pay | Admitting: Vascular Surgery

## 2017-05-29 DIAGNOSIS — I8393 Asymptomatic varicose veins of bilateral lower extremities: Secondary | ICD-10-CM

## 2017-05-30 ENCOUNTER — Ambulatory Visit (INDEPENDENT_AMBULATORY_CARE_PROVIDER_SITE_OTHER): Payer: Medicare Other | Admitting: Vascular Surgery

## 2017-05-30 ENCOUNTER — Ambulatory Visit (HOSPITAL_COMMUNITY)
Admission: RE | Admit: 2017-05-30 | Discharge: 2017-05-30 | Disposition: A | Discharge status: Home / Self Care | Payer: Medicare Other | Source: Ambulatory Visit | Attending: Vascular Surgery | Admitting: Vascular Surgery

## 2017-05-30 ENCOUNTER — Encounter: Payer: Self-pay | Admitting: Vascular Surgery

## 2017-05-30 VITALS — BP 129/68 | HR 68 | Temp 98.2°F | Resp 18 | Ht 64.5 in | Wt 147.0 lb

## 2017-05-30 DIAGNOSIS — I8393 Asymptomatic varicose veins of bilateral lower extremities: Secondary | ICD-10-CM

## 2017-05-30 DIAGNOSIS — I83893 Varicose veins of bilateral lower extremities with other complications: Secondary | ICD-10-CM | POA: Diagnosis not present

## 2017-05-30 NOTE — Progress Notes (Signed)
Subjective:     Patient ID: Shelby Mcguire, female   DOB: 03/10/1946, 71 y.o.   MRN: [id number]  HPI This 71 year old female is evaluated for painful varicosities and swelling in both lower extremities. She has had varicose veins in both legs for many years and did have some injections performed in [place] 10-12 years ago. She has developed progressive aching throbbing and burning discomfort in both lower extremities which worsens as the day progresses. She also developed swelling in the ankles. She has no history of DVT, thrombophlebitis, stasis ulcers, or bleeding. She does not elevate her legs or wear elastic compression stockings. Symptoms are worsening and affecting her daily living.  Past Medical History:  Diagnosis Date  . Arthritis   . Eczema   . Family history of breast cancer   . Family history of colon cancer   . Family history of ovarian cancer     Social History  Substance Use Topics  . Smoking status: Former Smoker    Packs/day: 1.00    Years: 15.00    Quit date: 03/01/1980  . Smokeless tobacco: Never Used  . Alcohol use No    Family History  Problem Relation Age of Onset  . Diabetes Mother   . Prostate cancer Father        dx late 42s  . Breast cancer Sister 97  . Asthma Sister   . Colon cancer Maternal Aunt        dx in her late 68s  . Cancer Paternal Uncle   . Ovarian cancer Sister 71  . Brain cancer Sister 11  . Alcohol abuse Son   . Lung cancer Maternal Aunt   . Allergic rhinitis Neg Hx   . Angioedema Neg Hx   . Eczema Neg Hx   . Immunodeficiency Neg Hx   . Urticaria Neg Hx     No Known Allergies   Current Outpatient Prescriptions:  .  Ascorbic Acid (VITAMIN C) 1000 MG tablet, Take 1,000 mg by mouth daily., Disp: , Rfl:  .  fluticasone (FLONASE) 50 MCG/ACT nasal spray, USE 2 SPRAYS IN EACH NOSTRIL ONCE DAILY, Disp: , Rfl:  .  folic acid (FOLVITE) 1 MG tablet, Take 3 mg by mouth daily., Disp: , Rfl:  .  hydrocortisone 2.5 % cream, , Disp: , Rfl:   .  L-Lysine 500 MG CAPS, Take 2 capsules by mouth daily., Disp: , Rfl:  .  methotrexate 2.5 MG tablet, Take 8 tablets (20 mg total) by mouth once a week. Caution: Chemotherapy. Protect from light. (Patient taking differently: Take 20 mg by mouth once a week. Caution: Chemotherapy. Protect from light. ), Disp: , Rfl:  .  MILK THISTLE EXTRACT PO, Take 250 mg by mouth 3 (three) times daily. , Disp: , Rfl:  .  Omega-3 Fatty Acids (FISH OIL CONCENTRATE) 300 MG CAPS, Take 3 capsules by mouth daily., Disp: , Rfl:  .  predniSONE (DELTASONE) 5 MG tablet, , Disp: , Rfl:  .  Red Yeast Rice Extract 600 MG CAPS, Take 3 each by mouth daily. , Disp: , Rfl:  .  Turmeric Curcumin 500 MG CAPS, Take 500 mg by mouth 2 (two) times daily. , Disp: , Rfl:   Vitals:   05/30/17 0902  BP: 129/68  Pulse: 68  Resp: 18  Temp: 98.2 F (36.8 C)  TempSrc: Oral  SpO2: 96%  Weight: 147 lb (66.7 kg)  Height: 5' 4.5" (1.638 m)    Body mass index is 24.84 kg/m.  Review of Systems Denies chest pain, dyspnea on exertion, PND, orthopnea, hemoptysis    Objective:   Physical Exam BP 129/68 (BP Location: Right Arm, Patient Position: Sitting, Cuff Size: Normal)   Pulse 68   Temp 98.2 F (36.8 C) (Oral)   Resp 18   Ht 5' 4.5" (1.638 m)   Wt 147 lb (66.7 kg)   SpO2 96%   BMI 24.84 kg/m     Gen.-alert and oriented x3 in no apparent distress HEENT normal for age Lungs no rhonchi or wheezing Cardiovascular regular rhythm no murmurs carotid pulses 3+ palpable no bruits audible Abdomen soft nontender no palpable masses Musculoskeletal free of  major deformities Skin clear -no rashes Neurologic normal Lower extremities 3+ femoral and dorsalis pedis pulses palpable bilaterally with 1+ edema on the left trace edema on the right Bulging varicosities left leg and medial thigh and medial and posterior calf with very early hyperpigmentation lower third leg with no active ulcer Right leg with bulging  varicosities in the medial thigh and medial calf over great saphenous system with trace edema distally.  Today I ordered a venous duplex exam of both legs which I reviewed and interpreted. 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 She will then need three-month waiting. To be evaluated for possible stab phlebectomy of residual varicosities Will return in 3 months

## 2017-06-27 ENCOUNTER — Other Ambulatory Visit: Payer: Self-pay | Admitting: Hematology and Oncology

## 2017-06-27 DIAGNOSIS — N6099 Unspecified benign mammary dysplasia of unspecified breast: Secondary | ICD-10-CM

## 2017-08-15 ENCOUNTER — Ambulatory Visit: Payer: Medicare Other | Admitting: Hematology and Oncology

## 2017-08-15 ENCOUNTER — Ambulatory Visit (HOSPITAL_BASED_OUTPATIENT_CLINIC_OR_DEPARTMENT_OTHER): Payer: Medicare Other | Admitting: Hematology and Oncology

## 2017-08-15 ENCOUNTER — Ambulatory Visit
Admission: RE | Admit: 2017-08-15 | Discharge: 2017-08-15 | Disposition: A | Discharge status: Home / Self Care | Payer: Medicare Other | Source: Ambulatory Visit | Attending: Hematology and Oncology | Admitting: Hematology and Oncology

## 2017-08-15 ENCOUNTER — Telehealth: Payer: Self-pay | Admitting: Hematology and Oncology

## 2017-08-15 VITALS — BP 161/63 | HR 78 | Temp 98.0°F | Resp 18 | Ht 64.5 in | Wt 152.4 lb

## 2017-08-15 DIAGNOSIS — N6091 Unspecified benign mammary dysplasia of right breast: Secondary | ICD-10-CM | POA: Diagnosis present

## 2017-08-15 DIAGNOSIS — Z1239 Encounter for other screening for malignant neoplasm of breast: Secondary | ICD-10-CM | POA: Insufficient documentation

## 2017-08-15 DIAGNOSIS — N6099 Unspecified benign mammary dysplasia of unspecified breast: Secondary | ICD-10-CM

## 2017-08-15 DIAGNOSIS — Z1231 Encounter for screening mammogram for malignant neoplasm of breast: Secondary | ICD-10-CM

## 2017-08-15 NOTE — Assessment & Plan Note (Signed)
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 These were normal.   Breast cancer Surveillance:  1. Today's breast exam did not reveal any abnormalities. 2. Breast mammogram 08/15/2017 Normal. I recommended continued annual mammograms and every other year MRIs. Next year she'll get a mammogram and a breast MRI.  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  Return to clinic in 1 year for breast exam and review of scans

## 2017-08-15 NOTE — Telephone Encounter (Signed)
Gave patient avs and calendar per 10/9 los

## 2017-08-15 NOTE — Progress Notes (Signed)
Patient Care Team: Leeroy Cha, MD as PCP - General (Internal Medicine)  DIAGNOSIS:  Encounter Diagnoses  Name Primary?  . Encounter for screening mammogram for malignant neoplasm of breast   . Breast cancer screening, high risk patient   . Atypical ductal hyperplasia of right breast Yes   CHIEF COMPLIANT: Follow-up on ADH and high risk breast cancer  INTERVAL HISTORY: Shelby Mcguire is a 71 year old with above-mentioned history of ADH and high risk family history who is currently here for surveillance checkups. She had a mammogram today which was normal. She denies any lumps or nodules in breast.  REVIEW OF SYSTEMS:   Constitutional: Denies fevers, chills or abnormal weight loss Eyes: Denies blurriness of vision Ears, nose, mouth, throat, and face: Denies mucositis or sore throat Respiratory: Denies cough, dyspnea or wheezes Cardiovascular: Denies palpitation, chest discomfort Gastrointestinal:  Denies nausea, heartburn or change in bowel habits Skin: Denies abnormal skin rashes Lymphatics: Denies new lymphadenopathy or easy bruising Neurological:Denies numbness, tingling or new weaknesses Behavioral/Psych: Mood is stable, no new changes  Extremities: No lower extremity edema Breast:  denies any pain or lumps or nodules in either breasts All other systems were reviewed with the patient and are negative.  I have reviewed the past medical history, past surgical history, social history and family history with the patient and they are unchanged from previous note.  ALLERGIES:  has No Known Allergies.  MEDICATIONS:  Current Outpatient Prescriptions  Medication Sig Dispense Refill  . Ascorbic Acid (VITAMIN C) 1000 MG tablet Take 1,000 mg by mouth daily.    . fluticasone (FLONASE) 50 MCG/ACT nasal spray USE 2 SPRAYS IN EACH NOSTRIL ONCE DAILY    . folic acid (FOLVITE) 1 MG tablet Take 3 mg by mouth daily.    . hydrocortisone 2.5 % cream     . L-Lysine 500 MG CAPS  Take 2 capsules by mouth daily.    . methotrexate 2.5 MG tablet Take 8 tablets (20 mg total) by mouth once a week. Caution: Chemotherapy. Protect from light. (Patient taking differently: Take 20 mg by mouth once a week. Caution: Chemotherapy. Protect from light. )    . MILK THISTLE EXTRACT PO Take 250 mg by mouth 3 (three) times daily.     . Omega-3 Fatty Acids (FISH OIL CONCENTRATE) 300 MG CAPS Take 3 capsules by mouth daily.    . predniSONE (DELTASONE) 5 MG tablet     . Red Yeast Rice Extract 600 MG CAPS Take 3 each by mouth daily.     . Turmeric Curcumin 500 MG CAPS Take 500 mg by mouth 2 (two) times daily.      No current facility-administered medications for this visit.     PHYSICAL EXAMINATION: ECOG PERFORMANCE STATUS: 1 - Symptomatic but completely ambulatory  Vitals:   08/15/17 1358  BP: (!) 161/63  Pulse: 78  Resp: 18  Temp: 98 F (36.7 C)  SpO2: 100%   Filed Weights   08/15/17 1358  Weight: 152 lb 6.4 oz (69.1 kg)    GENERAL:alert, no distress and comfortable SKIN: skin color, texture, turgor are normal, no rashes or significant lesions EYES: normal, Conjunctiva are pink and non-injected, sclera clear OROPHARYNX:no exudate, no erythema and lips, buccal mucosa, and tongue normal  NECK: supple, thyroid normal size, non-tender, without nodularity LYMPH:  no palpable lymphadenopathy in the cervical, axillary or inguinal LUNGS: clear to auscultation and percussion with normal breathing effort HEART: regular rate & rhythm and no murmurs and no lower extremity  edema ABDOMEN:abdomen soft, non-tender and normal bowel sounds MUSCULOSKELETAL:no cyanosis of digits and no clubbing  NEURO: alert & oriented x 3 with fluent speech, no focal motor/sensory deficits EXTREMITIES: No lower extremity edema BREAST: No palpable masses or nodules in either right or left breasts. No palpable axillary supraclavicular or infraclavicular adenopathy no breast tenderness or nipple discharge. (exam  performed in the presence of a chaperone)  LABORATORY DATA:  I have reviewed the data as listed   Chemistry      Component Value Date/Time   NA 139 08/03/2016 1009   K 4.7 08/03/2016 1009   CO2 27 08/03/2016 1009   BUN 15.6 08/03/2016 1009   CREATININE 0.7 08/03/2016 1009      Component Value Date/Time   CALCIUM 9.7 08/03/2016 1009   ALKPHOS 82 08/03/2016 1009   AST 16 08/03/2016 1009   ALT 14 08/03/2016 1009   BILITOT 0.34 08/03/2016 1009       Lab Results  Component Value Date   WBC 6.5 08/03/2016   HGB 13.9 08/03/2016   HCT 42.5 08/03/2016   MCV 90.1 08/03/2016   PLT 229 08/03/2016   NEUTROABS 4.4 08/03/2016    ASSESSMENT & PLAN:  Atypical ductal hyperplasia of right breast Right breast Atypical ductal hyperplasia with very high risk family history of breast cancer: I reviewed the MRI and a mammogram reports. These were normal.   Breast cancer Surveillance:  1. Today's breast exam did not reveal any abnormalities. 2. Breast mammogram 08/15/2017 Normal. I recommended continued annual mammograms and every other year MRIs. Next year she'll get a mammogram and a breast MRI.  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  Return to clinic in 1 year for breast exam and review of scans     I spent 25 minutes talking to the patient of which more than half was spent in counseling and coordination of care.  Orders Placed This Encounter  Procedures  . MR BREAST BILATERAL W WO CONTRAST    last mm at [facility] on 08-15-2017    Standing Status:   Future    Standing Expiration Date:   10/15/2018    Order Specific Question:   If indicated for the ordered procedure, I authorize the administration of contrast media per Radiology protocol    Answer:   Yes    Order Specific Question:   What is the patient's  sedation requirement?    Answer:   No Sedation    Order Specific Question:   Does the patient have a pacemaker or implanted devices?    Answer:   No    Order Specific Question:   Radiology Contrast Protocol - do NOT remove file path    Answer:   [email or web address]    Order Specific Question:   Reason for Exam additional comments    Answer:   ADH and family history More than 20% risk    Order Specific Question:   Preferred imaging location?    Answer:   [facility] (table limit-350 lbs)  . MM DIAG BREAST TOMO BILATERAL    Standing Status:   Future    Standing Expiration Date:   02/14/2019    Order Specific Question:   Reason for Exam (SYMPTOM  OR DIAGNOSIS REQUIRED)    Answer:   Annual Mammograms with ADH and family history  Order Specific Question:   Preferred imaging location?    Answer:   [facility]   The patient has a good understanding of the overall plan. she agrees with it. she will call with any problems that may develop before the next visit here.   Rulon Eisenmenger, MD 08/15/17

## 2017-08-29 ENCOUNTER — Ambulatory Visit (INDEPENDENT_AMBULATORY_CARE_PROVIDER_SITE_OTHER): Payer: Medicare Other | Admitting: Vascular Surgery

## 2017-08-29 ENCOUNTER — Encounter: Payer: Self-pay | Admitting: Vascular Surgery

## 2017-08-29 VITALS — BP 139/81 | HR 75 | Temp 98.7°F | Resp 16 | Ht 64.0 in | Wt 150.0 lb

## 2017-08-29 DIAGNOSIS — I83893 Varicose veins of bilateral lower extremities with other complications: Secondary | ICD-10-CM

## 2017-08-29 NOTE — Progress Notes (Signed)
Subjective:     Patient ID: Shelby Mcguire, female   DOB: 09-21-1946, 71 y.o.   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 She is tried long-leg elastic compression stockings 20-30 millimeter gradient as well as elevation and ibuprofen with no improvement in her symptoms. She continues to have significant aching throbbing and burning discomfort which worsens as the day progresses and is not improved by her conservative measures. This is affecting her daily living and she would like treatment. She has no history of DVT.  Past Medical History:  Diagnosis Date  . Arthritis   . Eczema   . Family history of breast cancer   . Family history of colon cancer   . Family history of ovarian cancer     Social History  Substance Use Topics  . Smoking status: Former Smoker    Packs/day: 1.00    Years: 15.00    Quit date: 03/01/1980  . Smokeless tobacco: Never Used  . Alcohol use No    Family History  Problem Relation Age of Onset  . Diabetes Mother   . Prostate cancer Father        dx late 25s  . Breast cancer Sister 53  . Asthma Sister   . Colon cancer Maternal Aunt        dx in her late 40s  . Cancer Paternal Uncle   . Ovarian cancer Sister 56  . Brain cancer Sister 31  . Alcohol abuse Son   . Lung cancer Maternal Aunt   . Allergic rhinitis Neg Hx   . Angioedema Neg Hx   . Eczema Neg Hx   . Immunodeficiency Neg Hx   . Urticaria Neg Hx     No Known Allergies   Current Outpatient Prescriptions:  .  Ascorbic Acid (VITAMIN C) 1000 MG tablet, Take 1,000 mg by mouth daily., Disp: , Rfl:  .  fluticasone (FLONASE) 50 MCG/ACT nasal spray, USE 2 SPRAYS IN EACH NOSTRIL ONCE DAILY, Disp: , Rfl:  .  folic acid (FOLVITE) 1 MG tablet, Take 3 mg by mouth daily., Disp: , Rfl:  .  hydrocortisone 2.5 % cream, , Disp: , Rfl:  .  L-Lysine 500 MG CAPS, Take 2  capsules by mouth daily., Disp: , Rfl:  .  methotrexate 2.5 MG tablet, Take 8 tablets (20 mg total) by mouth once a week. Caution: Chemotherapy. Protect from light. (Patient taking differently: Take 20 mg by mouth once a week. Caution: Chemotherapy. Protect from light. ), Disp: , Rfl:  .  MILK THISTLE EXTRACT PO, Take 250 mg by mouth 3 (three) times daily. , Disp: , Rfl:  .  Omega-3 Fatty Acids (FISH OIL CONCENTRATE) 300 MG CAPS, Take 3 capsules by mouth daily., Disp: , Rfl:  .  predniSONE (DELTASONE) 5 MG tablet, , Disp: , Rfl:  .  Red Yeast Rice Extract 600 MG CAPS, Take 3 each by mouth daily. , Disp: , Rfl:  .  Turmeric Curcumin 500 MG CAPS, Take 500 mg by mouth 2 (two) times daily. , Disp: , Rfl:   Vitals:   08/29/17 1016  BP: 139/81  Pulse: 75  Resp: 16  Temp: 98.7 F (37.1 C)  SpO2: 96%  Weight: 150 lb (68 kg)  Height: 5\' 4"  (1.626 m)    Body mass index is 25.75 kg/m.         Review of Systems  denies chest pain, dyspnea on exertion, PND, orthopnea, hemoptysis, claudication Objective:   Physical Exam BP 139/81 (BP Location: Left Arm, Patient Position: Sitting, Cuff Size: Normal)   Pulse 75   Temp 98.7 F (37.1 C)   Resp 16   Ht 5\' 4"  (1.626 m)   Wt 150 lb (68 kg)   SpO2 96%   BMI 25.75 kg/m   Gen. well-developed well-nourished female no apparent distress alert and oriented 3 Right leg with extensive bulging varicosities in the medial thigh and medial calf with 1+ edema distally Left leg with worsening varicosities in the medial thigh medial calf 1+ edema distally. No ulceration bilaterally.  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 This is causing symptoms which are affecting patient's daily living and resistant to conservative measures and she would like treatment    Plan:     Patient needs #1 laser ablation left great saphenous vein followed by #2 laser ablation right great saphenous vein She will then have a three-month waiting. We will then evaluate her for possible stab phlebectomy of residual varicosities We'll proceed with precertification to perform this in the near future and relieve her symptoms

## 2017-09-06 ENCOUNTER — Other Ambulatory Visit: Payer: Self-pay | Admitting: *Deleted

## 2017-09-06 DIAGNOSIS — I83893 Varicose veins of bilateral lower extremities with other complications: Secondary | ICD-10-CM

## 2017-10-03 ENCOUNTER — Encounter: Payer: Self-pay | Admitting: Vascular Surgery

## 2017-10-03 ENCOUNTER — Ambulatory Visit (INDEPENDENT_AMBULATORY_CARE_PROVIDER_SITE_OTHER): Payer: Medicare Other | Admitting: Vascular Surgery

## 2017-10-03 VITALS — BP 147/77 | HR 81 | Temp 97.2°F | Resp 18 | Ht 64.0 in | Wt 150.3 lb

## 2017-10-03 DIAGNOSIS — I83892 Varicose veins of left lower extremities with other complications: Secondary | ICD-10-CM | POA: Diagnosis not present

## 2017-10-03 HISTORY — PX: ENDOVENOUS ABLATION SAPHENOUS VEIN W/ LASER: SUR449

## 2017-10-03 NOTE — Progress Notes (Signed)
Laser Ablation Procedure    Date: 10/03/2017   Shelby Mcguire DOB:12-07-1945  Consent signed: Yes    Surgeon:  Dr. Nelda Severe. Kellie Simmering  Procedure: Laser Ablation: left Greater Saphenous Vein  BP (!) 147/77 (BP Location: Left Arm, Patient Position: Sitting, Cuff Size: Normal)   Pulse 81   Temp (!) 97.2 F (36.2 C) (Oral)   Resp 18   Ht 5\' 4"  (1.626 m)   Wt 150 lb 4.8 oz (68.2 kg)   SpO2 100%   BMI 25.80 kg/m   Tumescent Anesthesia: 450 cc 0.9% NaCl with 50 cc Lidocaine HCL1%  and 15 cc 8.4% NaHCO3  Local Anesthesia: 8 cc Lidocaine HCL and NaHCO3 (ratio 2:1)  Pulsed Mode: 15 watts, 569ms delay, 1.0 duration  Total Energy:   2308 Joules           Total Pulses:  155              Total Time: 2:34    Patient tolerated procedure well  Notes: Ativan 1 mg (1 tablet) taken by Ms. Atiyeh on 10-03-2017 at 8:20AM and at 9:00AM.    Description of Procedure:  After marking the course of the secondary varicosities, the patient was placed on the operating table in the supine position, and the left leg was prepped and draped in sterile fashion.   Local anesthetic was administered and under ultrasound guidance the saphenous vein was accessed with a micro needle and guide wire; then the mirco puncture sheath was placed.  A guide wire was inserted saphenofemoral junction , followed by a 5 french sheath.  The position of the sheath and then the laser fiber below the junction was confirmed using the ultrasound.  Tumescent anesthesia was administered along the course of the saphenous vein using ultrasound guidance. The patient was placed in Trendelenburg position and protective laser glasses were placed on patient and staff, and the laser was fired at 15 watts continuous mode advancing 1-20mm/second for a total of 2308 joules.     Steri strip was applied to the IV insertion site and ABD pads and thigh high compression stockings were applied.  Ace wrap bandages were applied over the left calf and thigh  and at the top of the saphenofemoral junction. Blood loss was less than 15 cc.  The patient ambulated out of the operating room having tolerated the procedure well.

## 2017-10-03 NOTE — Progress Notes (Signed)
Subjective:     Patient ID: Shelby Mcguire, female   DOB: Sep 01, 1946, 71 y.o.   MRN: [id number]  HPI This 71 year old female had laser ablation left great saphenous vein from the proximal calf to near the saphenofemoral junction performed under local tumescent anesthesia. A total of 2308 J of energy was utilized. She tolerated procedure well.  Review of Systems     Objective:   Physical Exam BP (!) 147/77 (BP Location: Left Arm, Patient Position: Sitting, Cuff Size: Normal)   Pulse 81   Temp (!) 97.2 F (36.2 C) (Oral)   Resp 18   Ht 5\' 4"  (1.626 m)   Wt 150 lb 4.8 oz (68.2 kg)   SpO2 100%   BMI 25.80 kg/m        Assessment:     Well-tolerated laser ablation left great saphenous vein performed under local tumescent anesthesia    Plan:     Return December 17 for venous duplex exam to confirm closure left great saphenous vein Patient will discontinue ibuprofen 9 tablets per day in 1 week Near future she will require similar procedure and contralateral right leg

## 2017-10-18 ENCOUNTER — Other Ambulatory Visit: Payer: Self-pay | Admitting: *Deleted

## 2017-10-18 MED ORDER — LORAZEPAM 1 MG PO TABS: 1.0000 mg | ORAL_TABLET | Freq: Once | ORAL | 0 refills | Status: AC

## 2017-10-23 ENCOUNTER — Encounter: Payer: Self-pay | Admitting: Vascular Surgery

## 2017-10-23 ENCOUNTER — Ambulatory Visit (HOSPITAL_COMMUNITY)
Admission: RE | Admit: 2017-10-23 | Discharge: 2017-10-23 | Disposition: A | Discharge status: Home / Self Care | Payer: Medicare Other | Source: Ambulatory Visit | Attending: Family | Admitting: Family

## 2017-10-23 ENCOUNTER — Ambulatory Visit (INDEPENDENT_AMBULATORY_CARE_PROVIDER_SITE_OTHER): Payer: Medicare Other | Admitting: Vascular Surgery

## 2017-10-23 ENCOUNTER — Other Ambulatory Visit: Payer: Self-pay

## 2017-10-23 VITALS — BP 139/80 | HR 69 | Temp 98.5°F | Resp 14 | Ht 64.0 in | Wt 150.0 lb

## 2017-10-23 DIAGNOSIS — I82812 Embolism and thrombosis of superficial veins of left lower extremities: Secondary | ICD-10-CM | POA: Diagnosis not present

## 2017-10-23 DIAGNOSIS — I83893 Varicose veins of bilateral lower extremities with other complications: Secondary | ICD-10-CM

## 2017-10-23 NOTE — Progress Notes (Signed)
Subjective:     Patient ID: Shelby Mcguire, female   DOB: [birth year], 71 y.o.   MRN: [id number]  HPI This 71 year old female returns 2 weeks post-laser ablation left great saphenous vein for painful varicosities and swelling. She had some mild-to-moderate discomfort for about one week which then has resolved. She's had no distal edema. She has worn her elastic compression stocking and taken ibuprofen as instructed.  Past Medical History:  Diagnosis Date  . Arthritis   . Eczema   . Family history of breast cancer   . Family history of colon cancer   . Family history of ovarian cancer     Social History   Tobacco Use  . Smoking status: Former Smoker    Packs/day: 1.00    Years: 15.00    Pack years: 15.00    Last attempt to quit: 03/01/1980    Years since quitting: 37.6  . Smokeless tobacco: Never Used  Substance Use Topics  . Alcohol use: No    Family History  Problem Relation Age of Onset  . Diabetes Mother   . Prostate cancer Father        dx late 108s  . Breast cancer Sister 98  . Asthma Sister   . Colon cancer Maternal Aunt        dx in her late 71s  . Cancer Paternal Uncle   . Ovarian cancer Sister 62  . Brain cancer Sister 80  . Alcohol abuse Son   . Lung cancer Maternal Aunt   . Allergic rhinitis Neg Hx   . Angioedema Neg Hx   . Eczema Neg Hx   . Immunodeficiency Neg Hx   . Urticaria Neg Hx     No Known Allergies   Current Outpatient Medications:  .  Ascorbic Acid (VITAMIN C) 1000 MG tablet, Take 1,000 mg by mouth daily., Disp: , Rfl:  .  fluticasone (FLONASE) 50 MCG/ACT nasal spray, USE 2 SPRAYS IN EACH NOSTRIL ONCE DAILY, Disp: , Rfl:  .  folic acid (FOLVITE) 1 MG tablet, Take 3 mg by mouth daily., Disp: , Rfl:  .  hydrocortisone 2.5 % cream, , Disp: , Rfl:  .  L-Lysine 500 MG CAPS, Take 2 capsules by mouth daily., Disp: , Rfl:  .  methotrexate 2.5 MG tablet, Take 8 tablets (20 mg total) by mouth once a week. Caution: Chemotherapy. Protect from light.  (Patient taking differently: Take 20 mg by mouth once a week. Caution: Chemotherapy. Protect from light. ), Disp: , Rfl:  .  MILK THISTLE EXTRACT PO, Take 250 mg by mouth 3 (three) times daily. , Disp: , Rfl:  .  Omega-3 Fatty Acids (FISH OIL CONCENTRATE) 300 MG CAPS, Take 3 capsules by mouth daily., Disp: , Rfl:  .  predniSONE (DELTASONE) 5 MG tablet, , Disp: , Rfl:  .  Red Yeast Rice Extract 600 MG CAPS, Take 3 each by mouth daily. , Disp: , Rfl:  .  Turmeric Curcumin 500 MG CAPS, Take 500 mg by mouth 2 (two) times daily. , Disp: , Rfl:   Vitals:   10/23/17 1036  BP: 139/80  Pulse: 69  Resp: 14  Temp: 98.5 F (36.9 C)  TempSrc: Oral  Weight: 150 lb (68 kg)  Height: 5\' 4"  (1.626 m)    Body mass index is 25.75 kg/m.         Review of Systems Denies chest pain, dyspnea on exertion, PND, orthopnea, hemoptysis, claudication    Objective:   Physical Exam BP  139/80 (BP Location: Left Arm, Patient Position: Sitting, Cuff Size: Normal)   Pulse 69   Temp 98.5 F (36.9 C) (Oral)   Resp 14   Ht 5\' 4"  (1.626 m)   Wt 150 lb (68 kg)   BMI 25.75 kg/m   Gen. well-developed well-nourished female no apparent distress alert and oriented 3 Lungs no rhonchi or wheezing Cardiovascular regular rhythm no murmurs Left leg with mild discomfort to deep palpation over great saphenous vein and medial thigh down to the knee level no distal edema noted. 3+ dorsalis pedis pulse palpable.  Today I ordered a venous duplex exam of the left leg which I reviewed and interpreted. There is no DVT. There is total closure of the left great saphenous vein from the distal thigh to near the saphenofemoral junction     Assessment:     Successful laser ablation left great saphenous vein for gross reflux with painful varicosities and swelling    Plan:     Patient to return this week for similar procedure and contralateral right leg

## 2017-10-24 ENCOUNTER — Encounter: Payer: Self-pay | Admitting: Vascular Surgery

## 2017-10-24 ENCOUNTER — Ambulatory Visit (INDEPENDENT_AMBULATORY_CARE_PROVIDER_SITE_OTHER): Payer: Medicare Other | Admitting: Vascular Surgery

## 2017-10-24 VITALS — BP 127/74 | HR 76 | Temp 97.5°F | Resp 16 | Ht 64.0 in | Wt 150.0 lb

## 2017-10-24 DIAGNOSIS — I83893 Varicose veins of bilateral lower extremities with other complications: Secondary | ICD-10-CM

## 2017-10-24 HISTORY — PX: ENDOVENOUS ABLATION SAPHENOUS VEIN W/ LASER: SUR449

## 2017-10-24 NOTE — Progress Notes (Signed)
Laser Ablation Procedure    Date: 10/24/2017   Shelby Mcguire DOB:12-05-1945  Consent signed: Yes    Surgeon:  Dr. Nelda Severe. Kellie Simmering  Procedure: Laser Ablation: right Greater Saphenous Vein  BP 127/74 (BP Location: Left Arm, Patient Position: Sitting, Cuff Size: Normal)   Pulse 76   Temp (!) 97.5 F (36.4 C) (Oral)   Resp 16   Ht 5\' 4"  (1.626 m)   Wt 150 lb (68 kg)   SpO2 96%   BMI 25.75 kg/m   Tumescent Anesthesia: 250 cc 0.9% NaCl with 50 cc Lidocaine HCL 1% and 15 cc 8.4% NaHCO3  Local Anesthesia: 4 cc Lidocaine HCL and NaHCO3 (ratio 2:1)  Pulsed Mode: 15 watts, 520ms delay, 1.0 duration  Total Energy:   1062 Joules           Total Pulses:  71              Total Time: 1:11      Patient tolerated procedure well  Notes: Ativan 2 tablets taken by patient on 10-24-2017 at 7:10AM.   Description of Procedure:  After marking the course of the secondary varicosities, the patient was placed on the operating table in the supine position, and the right leg was prepped and draped in sterile fashion.   Local anesthetic was administered and under ultrasound guidance the saphenous vein was accessed with a micro needle and guide wire; then the mirco puncture sheath was placed.  A guide wire was inserted saphenofemoral junction , followed by a 5 french sheath.  The position of the sheath and then the laser fiber below the junction was confirmed using the ultrasound.  Tumescent anesthesia was administered along the course of the saphenous vein using ultrasound guidance. The patient was placed in Trendelenburg position and protective laser glasses were placed on patient and staff, and the laser was fired at 15 watts continuous mode advancing 1-68mm/second for a total of 1062 joules.       Steri strip was applied to the IV insertion site and ABD pads and thigh high compression stockings were applied.  Ace wrap bandages were applied over the right thigh and at the top of the saphenofemoral  junction. Blood loss was less than 15 cc.  The patient ambulated out of the operating room having tolerated the procedure well.

## 2017-10-24 NOTE — Progress Notes (Signed)
Subjective:     Patient ID: Lubertha South, female   DOB: 18-Jan-1946, 71 y.o.   MRN: [id number]  HPI This 71 year old female had laser ablation of the right great saphenous vein from the mid to distal thigh to near the saphenofemoral junction performed under local tumescent anesthesia. A total of approximately 1080 J of energy was utilized. She tolerated the procedure well.  Review of Systems     Objective:   Physical Exam BP 127/74 (BP Location: Left Arm, Patient Position: Sitting, Cuff Size: Normal)   Pulse 76   Temp (!) 97.5 F (36.4 C) (Oral)   Resp 16   Ht 5\' 4"  (1.626 m)   Wt 150 lb (68 kg)   SpO2 96%   BMI 25.75 kg/m        Assessment:     Well-tolerated laser ablation right great saphenous vein performed under local tumescent anesthesia    Plan:     Return in 1 week for venous duplex exam to confirm closure right great saphenous vein and then patient will return in 3 months for evaluation for possible stab phlebectomy of residual varicosities

## 2017-10-25 ENCOUNTER — Encounter: Payer: Self-pay | Admitting: Vascular Surgery

## 2017-11-02 ENCOUNTER — Ambulatory Visit (HOSPITAL_COMMUNITY)
Admission: RE | Admit: 2017-11-02 | Discharge: 2017-11-02 | Disposition: A | Discharge status: Home / Self Care | Payer: Medicare Other | Source: Ambulatory Visit | Attending: Vascular Surgery | Admitting: Vascular Surgery

## 2017-11-02 ENCOUNTER — Encounter: Payer: Self-pay | Admitting: Vascular Surgery

## 2017-11-02 ENCOUNTER — Other Ambulatory Visit: Payer: Self-pay

## 2017-11-02 ENCOUNTER — Ambulatory Visit (INDEPENDENT_AMBULATORY_CARE_PROVIDER_SITE_OTHER): Payer: Medicare Other | Admitting: Vascular Surgery

## 2017-11-02 VITALS — BP 116/70 | HR 67 | Temp 98.0°F | Resp 16 | Ht 64.0 in | Wt 150.0 lb

## 2017-11-02 DIAGNOSIS — I83893 Varicose veins of bilateral lower extremities with other complications: Secondary | ICD-10-CM | POA: Insufficient documentation

## 2017-11-02 NOTE — Progress Notes (Signed)
Subjective:     Patient ID: Shelby Mcguire, female   DOB: [birth year], 71 y.o.   MRN: [id number]  HPI This 71 year old female had laser ablation right great saphenous vein performed 1 week ago and returns for follow-up. She has had mild discomfort which is now resolved. She denies any swelling in the right leg. She has worn her elastic compressions stocking and taken ibuprofen as instructed. She has no specific complaints.  Past Medical History:  Diagnosis Date  . Arthritis   . Eczema   . Family history of breast cancer   . Family history of colon cancer   . Family history of ovarian cancer     Social History   Tobacco Use  . Smoking status: Former Smoker    Packs/day: 1.00    Years: 15.00    Pack years: 15.00    Last attempt to quit: 03/01/1980    Years since quitting: 37.6  . Smokeless tobacco: Never Used  Substance Use Topics  . Alcohol use: No    Family History  Problem Relation Age of Onset  . Diabetes Mother   . Prostate cancer Father        dx late 48s  . Breast cancer Sister 76  . Asthma Sister   . Colon cancer Maternal Aunt        dx in her late 71s  . Cancer Paternal Uncle   . Ovarian cancer Sister 11  . Brain cancer Sister 26  . Alcohol abuse Son   . Lung cancer Maternal Aunt   . Allergic rhinitis Neg Hx   . Angioedema Neg Hx   . Eczema Neg Hx   . Immunodeficiency Neg Hx   . Urticaria Neg Hx     No Known Allergies   Current Outpatient Medications:  .  Ascorbic Acid (VITAMIN C) 1000 MG tablet, Take 1,000 mg by mouth daily., Disp: , Rfl:  .  fluticasone (FLONASE) 50 MCG/ACT nasal spray, USE 2 SPRAYS IN EACH NOSTRIL ONCE DAILY, Disp: , Rfl:  .  folic acid (FOLVITE) 1 MG tablet, Take 3 mg by mouth daily., Disp: , Rfl:  .  hydrocortisone 2.5 % cream, , Disp: , Rfl:  .  L-Lysine 500 MG CAPS, Take 2 capsules by mouth daily., Disp: , Rfl:  .  methotrexate 2.5 MG tablet, Take 8 tablets (20 mg total) by mouth once a week. Caution: Chemotherapy. Protect from  light. (Patient taking differently: Take 20 mg by mouth once a week. Caution: Chemotherapy. Protect from light. ), Disp: , Rfl:  .  MILK THISTLE EXTRACT PO, Take 250 mg by mouth 3 (three) times daily. , Disp: , Rfl:  .  Omega-3 Fatty Acids (FISH OIL CONCENTRATE) 300 MG CAPS, Take 3 capsules by mouth daily., Disp: , Rfl:  .  predniSONE (DELTASONE) 5 MG tablet, , Disp: , Rfl:  .  Red Yeast Rice Extract 600 MG CAPS, Take 3 each by mouth daily. , Disp: , Rfl:  .  Turmeric Curcumin 500 MG CAPS, Take 500 mg by mouth 2 (two) times daily. , Disp: , Rfl:   Vitals:   11/02/17 0954  BP: 116/70  Pulse: 67  Resp: 16  Temp: 98 F (36.7 C)  TempSrc: Oral  SpO2: 97%  Weight: 150 lb (68 kg)  Height: 5\' 4"  (1.626 m)    Body mass index is 25.75 kg/m.         Review of Systems Denies chest pain, dyspnea on exertion, PND, orthopnea, hemoptysis  Objective:   Physical Exam BP 116/70 (BP Location: Left Arm, Patient Position: Sitting, Cuff Size: Normal)   Pulse 67   Temp 98 F (36.7 C) (Oral)   Resp 16   Ht 5\' 4"  (1.626 m)   Wt 150 lb (68 kg)   SpO2 97%   BMI 25.75 kg/m   Gen. well-developed well-nourished female no apparent distress alert and oriented 3 Lungs no rhonchi or wheezing Cardiovascular regular rhythm no murmurs Right leg with mild discomfort to deep palpation over great saphenous vein in mid to upper half of thigh. No distal edema noted. 3+ dorsalis pedis pulse palpable.  Today I ordered a venous duplex exam the right leg which I reviewed and interpreted. There is no DVT. There is total closure of the right great saphenous vein up to near the saphenofemoral junction     Assessment:     Successful bilateral laser ablation great saphenous veins for reflux with painful varicosities    Plan:     Patient to return in 3 months for continued follow-up to evaluate for possible stab phlebectomy

## 2018-01-30 ENCOUNTER — Ambulatory Visit: Payer: Medicare Other | Admitting: Vascular Surgery

## 2018-02-06 ENCOUNTER — Ambulatory Visit: Payer: Medicare Other | Admitting: Vascular Surgery

## 2018-03-20 ENCOUNTER — Encounter: Payer: Self-pay | Admitting: Vascular Surgery

## 2018-03-20 ENCOUNTER — Ambulatory Visit (INDEPENDENT_AMBULATORY_CARE_PROVIDER_SITE_OTHER): Payer: Medicare Other | Admitting: Vascular Surgery

## 2018-03-20 ENCOUNTER — Other Ambulatory Visit: Payer: Self-pay

## 2018-03-20 VITALS — BP 153/83 | HR 70 | Temp 97.7°F | Resp 16 | Ht 64.0 in | Wt 149.0 lb

## 2018-03-20 DIAGNOSIS — I83893 Varicose veins of bilateral lower extremities with other complications: Secondary | ICD-10-CM

## 2018-03-20 NOTE — Progress Notes (Signed)
Subjective:     Patient ID: Shelby Mcguire, female   DOB: 1946/03/17, 72 y.o.   MRN: [id number]  HPI This 72 year old female returns for 42-month follow-up regarding her bilateral great saphenous vein laser ablation procedures for painful varicosities.  She has had no distal edema and has had no discomfort in the legs.  She states both legs feel much better than prior to the procedures with less heaviness.  She has no complaints.  Past Medical History:  Diagnosis Date  . Arthritis   . Eczema   . Family history of breast cancer   . Family history of colon cancer   . Family history of ovarian cancer     Social History   Tobacco Use  . Smoking status: Former Smoker    Packs/day: 1.00    Years: 15.00    Pack years: 15.00    Last attempt to quit: 03/01/1980    Years since quitting: 38.0  . Smokeless tobacco: Never Used  Substance Use Topics  . Alcohol use: No    Family History  Problem Relation Age of Onset  . Diabetes Mother   . Prostate cancer Father        dx late 21s  . Breast cancer Sister 18  . Asthma Sister   . Colon cancer Maternal Aunt        dx in her late 29s  . Cancer Paternal Uncle   . Ovarian cancer Sister 23  . Brain cancer Sister 56  . Alcohol abuse Son   . Lung cancer Maternal Aunt   . Allergic rhinitis Neg Hx   . Angioedema Neg Hx   . Eczema Neg Hx   . Immunodeficiency Neg Hx   . Urticaria Neg Hx     No Known Allergies   Current Outpatient Medications:  .  Ascorbic Acid (VITAMIN C) 1000 MG tablet, Take 1,000 mg by mouth daily., Disp: , Rfl:  .  fluticasone (FLONASE) 50 MCG/ACT nasal spray, USE 2 SPRAYS IN EACH NOSTRIL ONCE DAILY, Disp: , Rfl:  .  folic acid (FOLVITE) 1 MG tablet, Take 3 mg by mouth daily., Disp: , Rfl:  .  hydrocortisone 2.5 % cream, , Disp: , Rfl:  .  L-Lysine 500 MG CAPS, Take 2 capsules by mouth daily., Disp: , Rfl:  .  methotrexate 2.5 MG tablet, Take 8 tablets (20 mg total) by mouth once a week. Caution: Chemotherapy.  Protect from light. (Patient taking differently: Take 20 mg by mouth once a week. Caution: Chemotherapy. Protect from light. ), Disp: , Rfl:  .  MILK THISTLE EXTRACT PO, Take 250 mg by mouth 3 (three) times daily. , Disp: , Rfl:  .  Omega-3 Fatty Acids (FISH OIL CONCENTRATE) 300 MG CAPS, Take 3 capsules by mouth daily., Disp: , Rfl:  .  predniSONE (DELTASONE) 5 MG tablet, , Disp: , Rfl:  .  Red Yeast Rice Extract 600 MG CAPS, Take 3 each by mouth daily. , Disp: , Rfl:  .  Turmeric Curcumin 500 MG CAPS, Take 500 mg by mouth 2 (two) times daily. , Disp: , Rfl:   Vitals:   03/20/18 1306 03/20/18 1308  BP: (!) 180/84 (!) 153/83  Pulse: 66 70  Resp: 16   Temp: 97.7 F (36.5 C)   TempSrc: Oral   SpO2: 98%   Weight: 149 lb (67.6 kg)   Height: 5\' 4"  (1.626 m)     Body mass index is 25.58 kg/m.  Review of Systems Denies chest pain, dyspnea on exertion, PND, orthopnea, hemoptysis    Objective:   Physical Exam BP (!) 153/83 (BP Location: Left Arm, Patient Position: Sitting, Cuff Size: Normal)   Pulse 70   Temp 97.7 F (36.5 C) (Oral)   Resp 16   Ht 5\' 4"  (1.626 m)   Wt 149 lb (67.6 kg)   SpO2 98%   BMI 25.58 kg/m   General well-developed well-nourished female no apparent distress alert and oriented x3 Lungs no rhonchi or wheezing Left leg with some very small varicosities in the medial calf and posterior calf which are much less prominent than prior to the ablation.  No hyperpigmentation or ulceration noted.  3+ dorsalis pedis pulse palpable. Right leg with no visible varicosities in the calf anteriorly or posteriorly or in the thigh.  No hyperpigmentation or ulceration noted.     Assessment:     Excellent result following bilateral great saphenous vein laser ablation procedures for painful varicosities with good resolution of the painful bulging varicosities    Plan:     Do not feel any further procedures are indicated and she will return on a as needed basis

## 2018-06-21 ENCOUNTER — Ambulatory Visit (INDEPENDENT_AMBULATORY_CARE_PROVIDER_SITE_OTHER): Payer: Medicare Other | Admitting: Podiatry

## 2018-06-21 DIAGNOSIS — M2041 Other hammer toe(s) (acquired), right foot: Secondary | ICD-10-CM | POA: Diagnosis not present

## 2018-06-21 DIAGNOSIS — L84 Corns and callosities: Secondary | ICD-10-CM

## 2018-06-21 DIAGNOSIS — M2042 Other hammer toe(s) (acquired), left foot: Secondary | ICD-10-CM

## 2018-06-21 NOTE — Progress Notes (Signed)
Subjective: 72 year old female presents the office today for concerns of a spot on the bottom of her right second toe.  She states that she has a small dark spot to the area she try to file it down herself.  She states that the area is not painful and she denies any redness or drainage or any swelling.  She denies any recent injury trauma she does not recall stepping on any foreign object.  She has no other new concerns today. Denies any systemic complaints such as fevers, chills, nausea, vomiting. No acute changes since last appointment, and no other complaints at this time.   Objective: AAO x3, NAD DP/PT pulses palpable bilaterally, CRT less than 3 seconds Hammertoe contractures are present.  There is digital deformity and arthritic changes present of the toe.  On the plantar aspect of the right second toe on the PIPJ is a hyperkeratotic lesion with a small central area of the blood spot.  Upon debridement there is no underlying ulceration, drainage or any signs of infection there is no evidence of foreign body.  Upon removal of the small dark area was removed and this appeared to be an area of small pinpoint areas of old blood.  There is no signs of infection. Minimal hyperkeratotic tissue to the right fourth interspace without any pain she states it is not as bad now it is worse in the latter time.  Mild hyperkeratotic lesions left foot the metatarsal 2 and 3. No open lesions or pre-ulcerative lesions.  No pain with calf compression, swelling, warmth, erythema  Assessment: Hyperkeratotic lesion right second toe plantarly  Plan: -All treatment options discussed with the patient including all alternatives, risks, complications.  -I debrided the lesion without any complications or bleeding.  Discussed offloading pad and moisturizer to the area daily.  Declined x-rays but the area becomes more painful or recurrent we will get an x-ray.   -In general discussed shoe modifications and support to take  pressure off the feet. -Moisturizer to the callus is daily but not interdigitally -Patient encouraged to call the office with any questions, concerns, change in symptoms.   Trula Slade DPM

## 2018-07-05 ENCOUNTER — Other Ambulatory Visit: Payer: Self-pay | Admitting: Hematology and Oncology

## 2018-07-05 DIAGNOSIS — Z1231 Encounter for screening mammogram for malignant neoplasm of breast: Secondary | ICD-10-CM

## 2018-08-17 ENCOUNTER — Ambulatory Visit
Admission: RE | Admit: 2018-08-17 | Discharge: 2018-08-17 | Disposition: A | Discharge status: Home / Self Care | Payer: Medicare Other | Source: Ambulatory Visit | Attending: Hematology and Oncology | Admitting: Hematology and Oncology

## 2018-08-17 DIAGNOSIS — Z1231 Encounter for screening mammogram for malignant neoplasm of breast: Secondary | ICD-10-CM

## 2018-08-23 ENCOUNTER — Telehealth: Payer: Self-pay | Admitting: Hematology and Oncology

## 2018-08-23 ENCOUNTER — Ambulatory Visit (HOSPITAL_COMMUNITY)
Admission: RE | Admit: 2018-08-23 | Discharge: 2018-08-23 | Disposition: A | Discharge status: Home / Self Care | Payer: Medicare Other | Source: Ambulatory Visit | Attending: Hematology and Oncology | Admitting: Hematology and Oncology

## 2018-08-23 DIAGNOSIS — Z923 Personal history of irradiation: Secondary | ICD-10-CM | POA: Insufficient documentation

## 2018-08-23 DIAGNOSIS — Z1501 Genetic susceptibility to malignant neoplasm of breast: Secondary | ICD-10-CM | POA: Diagnosis not present

## 2018-08-23 DIAGNOSIS — Z1231 Encounter for screening mammogram for malignant neoplasm of breast: Secondary | ICD-10-CM | POA: Insufficient documentation

## 2018-08-23 LAB — POCT I-STAT CREATININE: Creatinine, Ser: 0.7 mg/dL (ref 0.44–1.00)

## 2018-08-23 MED ORDER — GADOBUTROL 1 MMOL/ML IV SOLN
6.0000 mL | Freq: Once | INTRAVENOUS | Status: AC | PRN
  Administered 2018-08-23: 6 mL via INTRAVENOUS

## 2018-08-23 NOTE — Telephone Encounter (Signed)
I informed the patient that her breast MRI came back normal. Patient is very relieved to hear this news. We will cancel her appointment for next week and moved to 6 months from now.

## 2018-08-29 ENCOUNTER — Ambulatory Visit: Payer: Medicare Other | Admitting: Hematology and Oncology

## 2018-08-31 ENCOUNTER — Ambulatory Visit: Payer: Medicare Other | Admitting: Hematology and Oncology

## 2018-10-19 ENCOUNTER — Other Ambulatory Visit: Payer: Self-pay | Admitting: Internal Medicine

## 2018-10-19 DIAGNOSIS — M8580 Other specified disorders of bone density and structure, unspecified site: Secondary | ICD-10-CM

## 2018-11-01 ENCOUNTER — Encounter (HOSPITAL_COMMUNITY): Payer: Self-pay

## 2018-11-01 ENCOUNTER — Other Ambulatory Visit: Payer: Self-pay

## 2018-11-01 ENCOUNTER — Emergency Department (HOSPITAL_COMMUNITY)
Admission: EM | Admit: 2018-11-01 | Discharge: 2018-11-02 | Disposition: A | Discharge status: Home / Self Care | Payer: Medicare Other | Attending: Emergency Medicine | Admitting: Emergency Medicine

## 2018-11-01 DIAGNOSIS — R51 Headache: Secondary | ICD-10-CM | POA: Diagnosis present

## 2018-11-01 DIAGNOSIS — Z79899 Other long term (current) drug therapy: Secondary | ICD-10-CM | POA: Diagnosis not present

## 2018-11-01 DIAGNOSIS — M069 Rheumatoid arthritis, unspecified: Secondary | ICD-10-CM | POA: Diagnosis not present

## 2018-11-01 DIAGNOSIS — I10 Essential (primary) hypertension: Secondary | ICD-10-CM | POA: Insufficient documentation

## 2018-11-01 DIAGNOSIS — Z87891 Personal history of nicotine dependence: Secondary | ICD-10-CM | POA: Insufficient documentation

## 2018-11-01 DIAGNOSIS — R519 Headache, unspecified: Secondary | ICD-10-CM

## 2018-11-01 LAB — CBC
HCT: 41 % (ref 36.0–46.0)
Hemoglobin: 13.8 g/dL (ref 12.0–15.0)
MCH: 29.9 pg (ref 26.0–34.0)
MCHC: 33.7 g/dL (ref 30.0–36.0)
MCV: 88.9 fL (ref 80.0–100.0)
PLATELETS: 238 10*3/uL (ref 150–400)
RBC: 4.61 MIL/uL (ref 3.87–5.11)
RDW: 12.6 % (ref 11.5–15.5)
WBC: 8.7 10*3/uL (ref 4.0–10.5)
nRBC: 0 % (ref 0.0–0.2)

## 2018-11-01 LAB — BASIC METABOLIC PANEL
Anion gap: 7 (ref 5–15)
BUN: 10 mg/dL (ref 8–23)
CALCIUM: 9.6 mg/dL (ref 8.9–10.3)
CO2: 30 mmol/L (ref 22–32)
CREATININE: 0.6 mg/dL (ref 0.44–1.00)
Chloride: 96 mmol/L — ABNORMAL LOW (ref 98–111)
GFR calc Af Amer: >60 mL/min (ref >60)
GLUCOSE: 112 mg/dL — AB (ref 70–99)
POTASSIUM: 3.6 mmol/L (ref 3.5–5.1)
Sodium: 133 mmol/L — ABNORMAL LOW (ref 135–145)

## 2018-11-01 NOTE — ED Triage Notes (Signed)
Pt here for HTN. PCP started on medication 2 weeks ago.  HTN has not been improving. Called PCP today and they stated don't take any meds and to come to ED.  Complaints of headache since yesterday.  A&Ox4.

## 2018-11-02 DIAGNOSIS — I10 Essential (primary) hypertension: Secondary | ICD-10-CM | POA: Diagnosis not present

## 2018-11-02 MED ORDER — IBUPROFEN 400 MG PO TABS
400.0000 mg | ORAL_TABLET | Freq: Once | ORAL | Status: AC
  Administered 2018-11-02: 400 mg via ORAL
  Filled 2018-11-02: qty 1

## 2018-11-02 MED ORDER — METOCLOPRAMIDE HCL 5 MG/ML IJ SOLN: 10.0000 mg | Freq: Once | INTRAMUSCULAR | Status: DC

## 2018-11-02 MED ORDER — SODIUM CHLORIDE 0.9 % IV BOLUS: 1000.0000 mL | Freq: Once | INTRAVENOUS | Status: DC

## 2018-11-02 MED ORDER — ACETAMINOPHEN 325 MG PO TABS
650.0000 mg | ORAL_TABLET | Freq: Once | ORAL | Status: AC
  Administered 2018-11-02: 650 mg via ORAL
  Filled 2018-11-02: qty 2

## 2018-11-02 MED ORDER — DIPHENHYDRAMINE HCL 50 MG/ML IJ SOLN: 25.0000 mg | Freq: Once | INTRAMUSCULAR | Status: DC

## 2018-11-02 NOTE — Discharge Instructions (Signed)
Continue to monitor your blood pressure once a day.   Take ibuprofen or acetaminophen as needed for headache.  Return if you are having any problems.

## 2018-11-02 NOTE — ED Provider Notes (Signed)
[facility] EMERGENCY DEPARTMENT Provider Note   CSN: [id number] Arrival date & time: 11/01/18  1856     History   Chief Complaint Chief Complaint  Patient presents with  . Hypertension    HPI Shelby Mcguire is a 72 y.o. female.  The history is provided by the patient.  Hypertension   She has history of rheumatoid arthritis, hyperlipidemia and had been on prednisone.  She was recently found to have elevated blood pressure and was taken off of prednisone and started on hydrochlorothiazide.  She has been monitoring her blood pressure at home.  Today, blood pressure was over 782 systolic.  She did have a bifrontal headache when she got off from work.  Headache was as severe as 8/10, has reduced since the disparity to 5/10.  She called her primary care provider and the on-call physician said that she should come to the emergency department.  She denies tinnitus or epistaxis.  Past Medical History:  Diagnosis Date  . Arthritis   . Eczema   . Family history of breast cancer   . Family history of colon cancer   . Family history of ovarian cancer     Patient Active Problem List   Diagnosis Date Noted  . Breast cancer screening, high risk patient 08/15/2017  . Varicose veins of bilateral lower extremities with other complications 95/62/1308  . Immunocompromised ([place]) 01/04/2017  . Allergic rhinitis due to allergen 01/04/2017  . Family history of breast cancer   . Family history of ovarian cancer   . Family history of colon cancer   . Genetic testing 02/12/2015  . Rheumatoid arthritis ([place]) 09/05/2012  . Hyperlipidemia 09/05/2012  . Atypical ductal hyperplasia of right breast 09/05/2012    Past Surgical History:  Procedure Laterality Date  . ABDOMINAL HYSTERECTOMY    . BREAST EXCISIONAL BIOPSY Left    benign  . BREAST SURGERY    . CESAREAN SECTION    . ENDOVENOUS ABLATION SAPHENOUS VEIN W/ LASER Left 10/03/2017   endovenous laser ablation L GSV   .  ENDOVENOUS ABLATION SAPHENOUS VEIN W/ LASER Right 10/24/2017   endovenous laser ablation right greater saphenous vein by Tinnie Gens MD   . [place] Right    ADH 1999  . TONSILLECTOMY       OB History   No obstetric history on file.      Home Medications    Prior to Admission medications   Medication Sig Start Date End Date Taking? Authorizing Provider  Ascorbic Acid (VITAMIN C) 1000 MG tablet Take 1,000 mg by mouth daily.    [provider]  fluticasone (FLONASE) 50 MCG/ACT nasal spray USE 2 SPRAYS IN EACH NOSTRIL ONCE DAILY 04/05/16   [provider]  folic acid (FOLVITE) 1 MG tablet Take 3 mg by mouth daily.    [provider]  hydrocortisone 2.5 % cream  10/10/13   [provider]  L-Lysine 500 MG CAPS Take 2 capsules by mouth daily.    [provider]  methotrexate 2.5 MG tablet Take 8 tablets (20 mg total) by mouth once a week. Caution: Chemotherapy. Protect from light. Patient taking differently: Take 20 mg by mouth once a week. Caution: Chemotherapy. Protect from light.  08/16/16   Nicholas Lose, MD  MILK THISTLE EXTRACT PO Take 250 mg by mouth 3 (three) times daily.     [provider]  Omega-3 Fatty Acids (FISH OIL CONCENTRATE) 300 MG CAPS Take 3 capsules by  mouth daily.    [provider]  predniSONE (DELTASONE) 5 MG tablet  07/31/16   [provider]  Red Yeast Rice Extract 600 MG CAPS Take 3 each by mouth daily.     [provider]  Turmeric Curcumin 500 MG CAPS Take 500 mg by mouth 2 (two) times daily.     [provider]    Family History Family History  Problem Relation Age of Onset  . Diabetes Mother   . Prostate cancer Father        dx late 56s  . Breast cancer Sister 28  . Asthma Sister   . Colon cancer Maternal Aunt        dx in her late 22s  . Cancer Paternal Uncle   . Ovarian cancer Sister 69  . Brain cancer Sister 68  . Alcohol abuse Son   . Lung  cancer Maternal Aunt   . Allergic rhinitis Neg Hx   . Angioedema Neg Hx   . Eczema Neg Hx   . Immunodeficiency Neg Hx   . Urticaria Neg Hx     Social History Social History   Tobacco Use  . Smoking status: Former Smoker    Packs/day: 1.00    Years: 15.00    Pack years: 15.00    Last attempt to quit: 03/01/1980    Years since quitting: 38.6  . Smokeless tobacco: Never Used  Substance Use Topics  . Alcohol use: No  . Drug use: No     Allergies   Patient has no known allergies.   Review of Systems Review of Systems  All other systems reviewed and are negative.    Physical Exam Updated Vital Signs BP (!) 154/76 (BP Location: Left Arm)   Pulse 72   Temp 98.3 F (36.8 C) (Oral)   Resp 16   Ht 5\' 4"  (1.626 m)   Wt 70.3 kg   SpO2 97%   BMI 26.61 kg/m   Physical Exam Vitals signs and nursing note reviewed.    72 year old female, resting comfortably and in no acute distress. Vital signs are significant for elevated blood pressure. Oxygen saturation is 97%, which is normal. Head is normocephalic and atraumatic. PERRLA, EOMI. Oropharynx is clear.  Fundi show no hemorrhage, exudate, or papilledema. Neck is nontender and supple without adenopathy or JVD. Back is nontender and there is no CVA tenderness. Lungs are clear without rales, wheezes, or rhonchi. Chest is nontender. Heart has regular rate and rhythm without murmur. Abdomen is soft, flat, nontender without masses or hepatosplenomegaly and peristalsis is normoactive. Extremities have no cyanosis or edema, full range of motion is present. Skin is warm and dry without rash. Neurologic: Mental status is normal, cranial nerves are intact, there are no motor or sensory deficits.  ED Treatments / Results  Labs (all labs ordered are listed, but only abnormal results are displayed) Labs Reviewed  BASIC METABOLIC PANEL - Abnormal; Notable for the following components:      Result Value   Sodium 133 (*)    Chloride  96 (*)    Glucose, Bld 112 (*)    All other components within normal limits  CBC   Procedures Procedures   Medications Ordered in ED Medications  ibuprofen (ADVIL,MOTRIN) tablet 400 mg (has no administration in time range)  acetaminophen (TYLENOL) tablet 650 mg (has no administration in time range)     Initial Impression / Assessment and Plan / ED Course  I have reviewed the  triage vital signs and the nursing notes.  Pertinent lab results that were available during my care of the patient were reviewed by me and considered in my medical decision making (see chart for details).  Labile hypertension.  In the ED, initial blood pressure was markedly elevated, but follow-up blood pressure was only mildly elevated.  I suspect some of her elevation of blood pressure was related to severity of headache.  Screening labs are unremarkable.  I have offered a headache cocktail, but patient stated that she was tired from having waited a long time before being seen and would just wish to go home.  She is given a dose of ibuprofen acetaminophen for her headache.  She is encouraged to continue monitoring her blood pressure at home, return should symptoms worsen.  She is to follow-up with her PCP for management of her hypertension.  Final Clinical Impressions(s) / ED Diagnoses   Final diagnoses:  Essential hypertension  Headache, unspecified headache type    ED Discharge Orders    None       Delora Fuel, MD 25/36/64 0121

## 2019-01-09 ENCOUNTER — Other Ambulatory Visit: Payer: Medicare Other

## 2019-02-25 NOTE — Assessment & Plan Note (Addendum)
Right breast Atypical ductal hyperplasiawith very high risk family history of breast cancer: I reviewed the MRI and a mammogram reports. These were normal.   Breast cancer Surveillance:  1.  Breast exam not performed because this is a WebEx visit. 2. Breast MRI 08/23/2018 Normal. 3.  Mammogram 08/17/2018: Benign breast density category B   I recommended continued annual mammograms and every other year MRIs.   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  Return to clinic in 1 year for follow-up

## 2019-02-26 ENCOUNTER — Telehealth: Payer: Self-pay | Admitting: Hematology and Oncology

## 2019-02-26 NOTE — Telephone Encounter (Signed)
Spoke with patient and she is not able to Webex but would like a phone call visit. She doesn't know how to access the internet.

## 2019-02-27 NOTE — Progress Notes (Signed)
  HEMATOLOGY-ONCOLOGY TELEPHONE VISIT PROGRESS NOTE  I connected with Shelby Mcguire on 02/28/2019 at  9:30 AM EDT by telephone and verified that I am speaking with the correct person using two identifiers.  I discussed the limitations, risks, security and privacy concerns of performing an evaluation and management service by telephone and the availability of in person appointments.  I also discussed with the patient that there may be a patient responsible charge related to this service. The patient expressed understanding and agreed to proceed.   History of Present Illness: Shelby Mcguire is a 73 y.o. female with above-mentioned history of high risk family history who is here for surveillance checkup. Her most recent breast MRI from 08/23/18 showed no evidence of malignancy bilaterally.  She continues to suffer from arthritis in the fingers and there is some discussion ongoing about immunotherapy for the arthritis.  She is concerned about the risk of cancer given her history with breast and colon.  Observations/Objective: No evidence of  breast cancer  Assessment Plan:  Atypical ductal hyperplasia of right breast Right breast Atypical ductal hyperplasiawith very high risk family history of breast cancer: I reviewed the MRI and a mammogram reports. These were normal.   Breast cancer Surveillance:  1.  Breast exam not performed because this is a WebEx visit. 2. Breast MRI 08/23/2018 Normal. 3.  Mammogram 08/17/2018: Benign breast density category B  I recommended continued annual mammograms and every other year MRIs.   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  Return to clinic in 1 year for breast exam and follow-up  I discussed the assessment and treatment plan with the patient. The patient was provided an  opportunity to ask questions and all were answered. The patient agreed with the plan and demonstrated an understanding of the instructions. The patient was advised to call back or seek an in-person evaluation if the symptoms worsen or if the condition fails to improve as anticipated.   I provided 15 minutes of non-face-to-face time during this encounter.   Rulon Eisenmenger, MD 02/28/2019    I, Molly Dorshimer, am acting as scribe for Nicholas Lose, MD.  I have reviewed the above documentation for accuracy and completeness, and I agree with the above.

## 2019-02-28 ENCOUNTER — Inpatient Hospital Stay: Payer: Medicare Other | Attending: Hematology and Oncology | Admitting: Hematology and Oncology

## 2019-02-28 DIAGNOSIS — Z1239 Encounter for other screening for malignant neoplasm of breast: Secondary | ICD-10-CM | POA: Diagnosis not present

## 2019-02-28 DIAGNOSIS — N6091 Unspecified benign mammary dysplasia of right breast: Secondary | ICD-10-CM

## 2019-02-28 NOTE — Assessment & Plan Note (Signed)
Right breast Atypical ductal hyperplasiawith very high risk family history of breast cancer: I reviewed the MRI and a mammogram reports. These were normal.   Breast cancer Surveillance:  1.  Breast exam October 2018: Benign 2. Breast mammogram  08/17/2018  benign breast density category B.  3.  Breast MRI 08/23/2018: No evidence of malignancy.  I recommended continued annual mammograms and every other year MRIs.  This year she will get a mammogram in October.  MRI will be done in 2021.   2022BRCA2 c.7938C>G (p.Cys2646Trp) VUS: although VUS does not indicate a pathologic mutation, because of her extensive family history with the lifetime risk of over 20%, we are performing breast MRIs every other year.  Return to clinic in 1 year for breast exam and follow-up

## 2019-06-24 ENCOUNTER — Other Ambulatory Visit: Payer: Self-pay | Admitting: Hematology and Oncology

## 2019-06-24 DIAGNOSIS — Z1231 Encounter for screening mammogram for malignant neoplasm of breast: Secondary | ICD-10-CM

## 2019-09-11 ENCOUNTER — Other Ambulatory Visit: Payer: Self-pay

## 2019-09-11 ENCOUNTER — Ambulatory Visit
Admission: RE | Admit: 2019-09-11 | Discharge: 2019-09-11 | Disposition: A | Discharge status: Home / Self Care | Payer: Medicare Other | Source: Ambulatory Visit | Attending: Hematology and Oncology | Admitting: Hematology and Oncology

## 2019-09-11 DIAGNOSIS — Z1231 Encounter for screening mammogram for malignant neoplasm of breast: Secondary | ICD-10-CM

## 2019-12-19 ENCOUNTER — Ambulatory Visit: Payer: Medicare Other | Attending: Internal Medicine

## 2019-12-19 DIAGNOSIS — Z23 Encounter for immunization: Secondary | ICD-10-CM | POA: Insufficient documentation

## 2019-12-19 NOTE — Progress Notes (Signed)
   Covid-19 Vaccination Clinic  Name:  Sloan Snawder    MRN: [id number] DOB: 09/05/1946  12/19/2019  Ms. Rake was observed post Covid-19 immunization for 15 minutes without incidence. She was provided with Vaccine Information Sheet and instruction to access the V-Safe system.   Ms. Bemis was instructed to call 911 with any severe reactions post vaccine: Marland Kitchen Difficulty breathing  . Swelling of your face and throat  . A fast heartbeat  . A bad rash all over your body  . Dizziness and weakness    Immunizations Administered    Name Date Dose VIS Date Route   Pfizer COVID-19 Vaccine 12/19/2019 10:06 AM 0.3 mL 10/18/2019 Intramuscular   Manufacturer: [employer], [employer]   Lot: [id number]   [place]: [id number]

## 2020-01-11 ENCOUNTER — Ambulatory Visit: Payer: Medicare Other | Attending: Internal Medicine

## 2020-01-11 DIAGNOSIS — Z23 Encounter for immunization: Secondary | ICD-10-CM | POA: Insufficient documentation

## 2020-01-11 NOTE — Progress Notes (Signed)
   Covid-19 Vaccination Clinic  Name:  Shelby Mcguire    MRN: [id number] DOB: 05-14-1946  01/11/2020  Shelby Mcguire was observed post Covid-19 immunization for 15 minutes without incident. She was provided with Vaccine Information Sheet and instruction to access the V-Safe system.   Shelby Mcguire was instructed to call 911 with any severe reactions post vaccine: Marland Kitchen Difficulty breathing  . Swelling of face and throat  . A fast heartbeat  . A bad rash all over body  . Dizziness and weakness   Immunizations Administered    Name Date Dose VIS Date Route   Pfizer COVID-19 Vaccine 01/11/2020  9:29 AM 0.3 mL 10/18/2019 Intramuscular   Manufacturer: [place]   Lot: [id number]   [place]: [id number]

## 2020-07-23 ENCOUNTER — Other Ambulatory Visit: Payer: Self-pay

## 2020-07-23 DIAGNOSIS — N6091 Unspecified benign mammary dysplasia of right breast: Secondary | ICD-10-CM

## 2020-07-23 DIAGNOSIS — Z1239 Encounter for other screening for malignant neoplasm of breast: Secondary | ICD-10-CM

## 2020-07-30 ENCOUNTER — Other Ambulatory Visit: Payer: Self-pay | Admitting: *Deleted

## 2020-07-30 DIAGNOSIS — N6091 Unspecified benign mammary dysplasia of right breast: Secondary | ICD-10-CM

## 2020-08-06 ENCOUNTER — Ambulatory Visit (HOSPITAL_COMMUNITY): Payer: Medicare Other

## 2020-09-03 ENCOUNTER — Ambulatory Visit (HOSPITAL_COMMUNITY)
Admission: RE | Admit: 2020-09-03 | Discharge: 2020-09-03 | Disposition: A | Discharge status: Home / Self Care | Payer: Medicare Other | Source: Ambulatory Visit | Attending: Hematology and Oncology | Admitting: Hematology and Oncology

## 2020-09-03 ENCOUNTER — Other Ambulatory Visit: Payer: Self-pay

## 2020-09-03 DIAGNOSIS — N6091 Unspecified benign mammary dysplasia of right breast: Secondary | ICD-10-CM | POA: Diagnosis present

## 2020-09-03 DIAGNOSIS — Z1239 Encounter for other screening for malignant neoplasm of breast: Secondary | ICD-10-CM

## 2020-09-03 MED ORDER — GADOBUTROL 1 MMOL/ML IV SOLN
7.0000 mL | Freq: Once | INTRAVENOUS | Status: AC | PRN
  Administered 2020-09-03: 7 mL via INTRAVENOUS

## 2020-09-17 ENCOUNTER — Other Ambulatory Visit: Payer: Self-pay

## 2020-09-17 ENCOUNTER — Ambulatory Visit
Admission: RE | Admit: 2020-09-17 | Discharge: 2020-09-17 | Disposition: A | Discharge status: Home / Self Care | Payer: Medicare Other | Source: Ambulatory Visit | Attending: Hematology and Oncology | Admitting: Hematology and Oncology

## 2020-09-17 DIAGNOSIS — Z1239 Encounter for other screening for malignant neoplasm of breast: Secondary | ICD-10-CM

## 2020-09-17 DIAGNOSIS — N6091 Unspecified benign mammary dysplasia of right breast: Secondary | ICD-10-CM

## 2020-10-20 NOTE — Patient Instructions (Addendum)
DUE TO COVID-19 ONLY ONE VISITOR IS ALLOWED TO COME WITH YOU AND STAY IN THE WAITING ROOM ONLY DURING PRE OP AND PROCEDURE DAY OF SURGERY. THE 1 VISITOR  MAY VISIT WITH YOU AFTER SURGERY IN YOUR PRIVATE ROOM DURING VISITING HOURS ONLY!  YOU NEED TO HAVE A COVID 19 TEST ON_12/20______ @__2 :45_____, THIS TEST MUST BE DONE BEFORE SURGERY,  COVID TESTING SITE [place] [address], IT IS ON THE RIGHT GOING OUT [address] APPROXIMATELY  2 MINUTES PAST [employer] ON THE RIGHT. ONCE YOUR COVID TEST IS COMPLETED,  PLEASE BEGIN THE QUARANTINE INSTRUCTIONS AS OUTLINED IN YOUR HANDOUT.                Kimber Relic    Your procedure is scheduled on: 10/28/20   Report to [facility] Main  Entrance   Report to short stay 5:30  AM     Call this number if you have problems the morning of surgery [phone]    Remember: Do not eat food or drink liquids :After Midnight.   BRUSH YOUR TEETH MORNING OF SURGERY AND RINSE YOUR MOUTH OUT, NO CHEWING GUM CANDY OR MINTS.    No food after midnight  .  You may have clear liquid until 4:30 AM.    . Nothing by mouth after 4:30 AM.   Take these medicines the morning of surgery with A SIP OF WATER: Dilt-XR                                 You may not have any metal on your body including hair pins and              piercings  Do not wear jewelry, make-up, lotions, powders or perfumes, deodorant             Do not wear nail polish on your fingernails.  Do not shave  48 hours prior to surgery.              Do not bring valuables to the hospital. [facility].  Contacts, dentures or bridgework may not be worn into surgery.                   Please read over the following fact sheets you were given: _____________________________________________________________________             [facility] - Preparing for Surgery Before surgery, you can play an important  role.  Because skin is not sterile, your skin needs to be as free of germs as possible.  You can reduce the number of germs on your skin by washing with CHG (chlorahexidine gluconate) soap before surgery.  CHG is an antiseptic cleaner which kills germs and bonds with the skin to continue killing germs even after washing. Please DO NOT use if you have an allergy to CHG or antibacterial soaps.  If your skin becomes reddened/irritated stop using the CHG and inform your nurse when you arrive at Short Stay. Do not shave (including legs and underarms) for at least 48 hours prior to the first CHG shower.   Please follow these instructions carefully:  1.  Shower with CHG Soap the night before surgery and the  morning of Surgery.  2.  If you choose to wash your hair,  wash your hair first as usual with your  normal  shampoo.  3.  After you shampoo, rinse your hair and body thoroughly to remove the  shampoo.                                        4.  Use CHG as you would any other liquid soap.  You can apply chg directly  to the skin and wash                       Gently with a scrungie or clean washcloth.  5.  Apply the CHG Soap to your body ONLY FROM THE NECK DOWN.   Do not use on face/ open                           Wound or open sores. Avoid contact with eyes, ears mouth and genitals (private parts).                       Wash face,  Genitals (private parts) with your normal soap.             6.  Wash thoroughly, paying special attention to the area where your surgery  will be performed.  7.  Thoroughly rinse your body with warm water from the neck down.  8.  DO NOT shower/wash with your normal soap after using and rinsing off  the CHG Soap.                9.  Pat yourself dry with a clean towel.            10.  Wear clean pajamas.            11.  Place clean sheets on your bed the night of your first shower and do not  sleep with pets. Day of Surgery : Do not apply any lotions/deodorants the morning of  surgery.  Please wear clean clothes to the hospital/surgery center.  FAILURE TO FOLLOW THESE INSTRUCTIONS MAY RESULT IN THE CANCELLATION OF YOUR SURGERY PATIENT SIGNATURE_________________________________  NURSE SIGNATURE__________________________________  ________________________________________________________________________   Shelby Mcguire  An incentive spirometer is a tool that can help keep your lungs clear and active. This tool measures how well you are filling your lungs with each breath. Taking long deep breaths may help reverse or decrease the chance of developing breathing (pulmonary) problems (especially infection) following:  A long period of time when you are unable to move or be active. BEFORE THE PROCEDURE   If the spirometer includes an indicator to show your best effort, your nurse or respiratory therapist will set it to a desired goal.  If possible, sit up straight or lean slightly forward. Try not to slouch.  Hold the incentive spirometer in an upright position. INSTRUCTIONS FOR USE  1. Sit on the edge of your bed if possible, or sit up as far as you can in bed or on a chair. 2. Hold the incentive spirometer in an upright position. 3. Breathe out normally. 4. Place the mouthpiece in your mouth and seal your lips tightly around it. 5. Breathe in slowly and as deeply as possible, raising the piston or the ball toward the top of the column. 6. Hold your breath for 3-5 seconds or for as long as possible.  Allow the piston or ball to fall to the bottom of the column. 7. Remove the mouthpiece from your mouth and breathe out normally. 8. Rest for a few seconds and repeat Steps 1 through 7 at least 10 times every 1-2 hours when you are awake. Take your time and take a few normal breaths between deep breaths. 9. The spirometer may include an indicator to show your best effort. Use the indicator as a goal to work toward during each repetition. 10. After each set of 10  deep breaths, practice coughing to be sure your lungs are clear. If you have an incision (the cut made at the time of surgery), support your incision when coughing by placing a pillow or rolled up towels firmly against it. Once you are able to get out of bed, walk around indoors and cough well. You may stop using the incentive spirometer when instructed by your caregiver.  RISKS AND COMPLICATIONS  Take your time so you do not get dizzy or light-headed.  If you are in pain, you may need to take or ask for pain medication before doing incentive spirometry. It is harder to take a deep breath if you are having pain. AFTER USE  Rest and breathe slowly and easily.  It can be helpful to keep track of a log of your progress. Your caregiver can provide you with a simple table to help with this. If you are using the spirometer at home, follow these instructions: [place] IF:   You are having difficultly using the spirometer.  You have trouble using the spirometer as often as instructed.  Your pain medication is not giving enough relief while using the spirometer.  You develop fever of 100.5 F (38.1 C) or higher. SEEK IMMEDIATE MEDICAL CARE IF:   You cough up bloody sputum that had not been present before.  You develop fever of 102 F (38.9 C) or greater.  You develop worsening pain at or near the incision site. MAKE SURE YOU:   Understand these instructions.  Will watch your condition.  Will get help right away if you are not doing well or get worse. Document Released: 03/06/2007 Document Revised: 01/16/2012 Document Reviewed: 05/07/2007 [facility] Patient Information 2014 [place], [place].   ________________________________________________________________________

## 2020-10-22 ENCOUNTER — Other Ambulatory Visit: Payer: Self-pay

## 2020-10-22 ENCOUNTER — Encounter (HOSPITAL_COMMUNITY)
Admission: RE | Admit: 2020-10-22 | Discharge: 2020-10-22 | Disposition: A | Discharge status: Home / Self Care | Payer: Medicare Other | Source: Ambulatory Visit | Attending: Specialist | Admitting: Specialist

## 2020-10-22 ENCOUNTER — Encounter (HOSPITAL_COMMUNITY): Payer: Self-pay

## 2020-10-22 DIAGNOSIS — Z01818 Encounter for other preprocedural examination: Secondary | ICD-10-CM | POA: Diagnosis not present

## 2020-10-22 DIAGNOSIS — I10 Essential (primary) hypertension: Secondary | ICD-10-CM | POA: Insufficient documentation

## 2020-10-22 HISTORY — DX: Peripheral vascular disease, unspecified: I73.9

## 2020-10-22 HISTORY — DX: Essential (primary) hypertension: I10

## 2020-10-22 LAB — BASIC METABOLIC PANEL
Anion gap: 11 (ref 5–15)
BUN: 12 mg/dL (ref 8–23)
CO2: 28 mmol/L (ref 22–32)
Calcium: 9.3 mg/dL (ref 8.9–10.3)
Chloride: 96 mmol/L — ABNORMAL LOW (ref 98–111)
Creatinine, Ser: 0.64 mg/dL (ref 0.44–1.00)
GFR, Estimated: >60 mL/min (ref >60)
Glucose, Bld: 106 mg/dL — ABNORMAL HIGH (ref 70–99)
Potassium: 4.2 mmol/L (ref 3.5–5.1)
Sodium: 135 mmol/L (ref 135–145)

## 2020-10-22 LAB — CBC
HCT: 43.2 % (ref 36.0–46.0)
Hemoglobin: 14.7 g/dL (ref 12.0–15.0)
MCH: 31 pg (ref 26.0–34.0)
MCHC: 34 g/dL (ref 30.0–36.0)
MCV: 91.1 fL (ref 80.0–100.0)
Platelets: 283 10*3/uL (ref 150–400)
RBC: 4.74 MIL/uL (ref 3.87–5.11)
RDW: 12.9 % (ref 11.5–15.5)
WBC: 6.2 10*3/uL (ref 4.0–10.5)
nRBC: 0 % (ref 0.0–0.2)

## 2020-10-22 LAB — SURGICAL PCR SCREEN
MRSA, PCR: NEGATIVE
Staphylococcus aureus: NEGATIVE

## 2020-10-22 LAB — NO BLOOD PRODUCTS

## 2020-10-22 NOTE — H&P (View-Only) (Signed)
COVID Vaccine Completed:Yes Date COVID Vaccine completed:01/11/20-Booster 07/02/20 COVID vaccine manufacturer: Pfizer    PCP - Dr. Dossie Der and PCP is R. [place] Cardiologist - none  Chest x-ray - no EKG - 10/22/20-Epic and chart Stress Test - no ECHO - no Cardiac Cath - no Pacemaker/ICD device last checked:NA  Sleep Study - no CPAP -   Fasting Blood Sugar - NA Checks Blood Sugar _____ times a day  Blood Thinner Instructions:no Aspirin Instructions: Last Dose:  Anesthesia review:   Patient denies shortness of breath, fever, cough and chest pain at PAT appointment yes   Patient verbalized understanding of instructions that were given to them at the PAT appointment. Patient was also instructed that they will need to review over the PAT instructions again at home before surgery. Yes Pt doesn't climb stairs because of her RA but she has no SOB doing housework or ADLs. She will hold her methotrexate. NO BLOOD PRODUCTS

## 2020-10-22 NOTE — Progress Notes (Addendum)
COVID Vaccine Completed:Yes Date COVID Vaccine completed:01/11/20-Booster 07/02/20 COVID vaccine manufacturer: Pfizer    PCP - Dr. Dossie Der and PCP is R. [place] Cardiologist - none  Chest x-ray - no EKG - 10/22/20-Epic and chart Stress Test - no ECHO - no Cardiac Cath - no Pacemaker/ICD device last checked:NA  Sleep Study - no CPAP -   Fasting Blood Sugar - NA Checks Blood Sugar _____ times a day  Blood Thinner Instructions:no Aspirin Instructions: Last Dose:  Anesthesia review:   Patient denies shortness of breath, fever, cough and chest pain at PAT appointment yes   Patient verbalized understanding of instructions that were given to them at the PAT appointment. Patient was also instructed that they will need to review over the PAT instructions again at home before surgery. Yes Pt doesn't climb stairs because of her RA but she has no SOB doing housework or ADLs. She will hold her methotrexate. NO BLOOD PRODUCTS

## 2020-10-23 NOTE — H&P (Signed)
TOTAL KNEE ADMISSION H&P  Patient is being admitted for right total knee arthroplasty.  Subjective:  Chief Complaint:right knee pain.  HPI: Shelby Mcguire, 74 y.o. female, has a history of pain and functional disability in the right knee due to arthritis and has failed non-surgical conservative treatments for greater than 12 weeks to includeNSAID's and/or analgesics, corticosteriod injections and activity modification.  Onset of symptoms was gradual, starting 2 years ago with gradually worsening course since that time. The patient noted no past surgery on the right knee(s).  Patient currently rates pain in the right knee(s) at 5 out of 10 with activity. Patient has worsening of pain with activity and weight bearing, pain that interferes with activities of daily living, pain with passive range of motion and crepitus.  Patient has evidence of subchondral sclerosis, periarticular osteophytes and joint space narrowing by imaging studies. This patient has had no previous injury. There is no active infection.  Patient Active Problem List   Diagnosis Date Noted  . Breast cancer screening, high risk patient 08/15/2017  . Varicose veins of bilateral lower extremities with other complications 72/53/6644  . Immunocompromised ([place]) 01/04/2017  . Allergic rhinitis due to allergen 01/04/2017  . Family history of breast cancer   . Family history of ovarian cancer   . Family history of colon cancer   . Genetic testing 02/12/2015  . Rheumatoid arthritis ([place]) 09/05/2012  . Hyperlipidemia 09/05/2012  . Atypical ductal hyperplasia of right breast 09/05/2012   Past Medical History:  Diagnosis Date  . Arthritis   . Eczema   . Family history of breast cancer   . Family history of colon cancer   . Family history of ovarian cancer   . Hypertension   . Peripheral vascular disease ([place])    bi lat    Past Surgical History:  Procedure Laterality Date  . ABDOMINAL HYSTERECTOMY    . BREAST EXCISIONAL  BIOPSY Left    benign  . BREAST SURGERY Bilateral   . CESAREAN SECTION    . ENDOVENOUS ABLATION SAPHENOUS VEIN W/ LASER Left 10/03/2017   endovenous laser ablation L GSV   . ENDOVENOUS ABLATION SAPHENOUS VEIN W/ LASER Right 10/24/2017   endovenous laser ablation right greater saphenous vein by Tinnie Gens MD   . [place] Right    ADH 1999  . THYROIDECTOMY, PARTIAL  1982  . TONSILLECTOMY      No current facility-administered medications for this encounter.   Current Outpatient Medications  Medication Sig Dispense Refill Last Dose  . Ascorbic Acid (VITAMIN C) 1000 MG tablet Take 1,000 mg by mouth daily.     . Biotin 1000 MCG tablet Take 1,000 mcg by mouth daily.     . Calcium Carb-Cholecalciferol ([employer] CALCIUM 600 & VITAMIN D3 PO) Take 1 tablet by mouth in the morning and at bedtime.     . cholecalciferol (VITAMIN D3) 25 MCG (1000 UNIT) tablet Take 1,000 Units by mouth daily.     Marland Kitchen DILT-XR 180 MG 24 hr capsule Take 180 mg by mouth daily.     . fluticasone (FLONASE) 50 MCG/ACT nasal spray Place 2 sprays into both nostrils daily as needed for allergies.     . folic acid (FOLVITE) 1 MG tablet Take 3 mg by mouth daily.     . hydrochlorothiazide (HYDRODIURIL) 25 MG tablet Take 25 mg by mouth daily.     Marland Kitchen L-Lysine 500 MG CAPS Take 2 capsules by mouth daily.     Marland Kitchen  methotrexate 2.5 MG tablet Take 8 tablets (20 mg total) by mouth once a week. Caution: Chemotherapy. Protect from light. (Patient taking differently: Take 17.5 mg by mouth once a week. Caution: Chemotherapy. Protect from light.)     . MILK THISTLE EXTRACT PO Take 250 mg by mouth 3 (three) times daily.      . Omega-3 Fatty Acids (FISH OIL CONCENTRATE) 300 MG CAPS Take 3 capsules by mouth daily.     . Potassium 99 MG TABS Take 99 mg by mouth daily.     . predniSONE (DELTASONE) 5 MG tablet Take 5 mg by mouth daily as needed (flare ups).     . Red Yeast Rice Extract 600 MG CAPS Take 3 each by mouth daily.      . Turmeric  Curcumin 500 MG CAPS Take 500 mg by mouth 2 (two) times daily.       No Known Allergies  Social History   Tobacco Use  . Smoking status: Former Smoker    Packs/day: 1.00    Years: 15.00    Pack years: 15.00    Quit date: 03/01/1980    Years since quitting: 40.6  . Smokeless tobacco: Never Used  Substance Use Topics  . Alcohol use: No    Family History  Problem Relation Age of Onset  . Diabetes Mother   . Prostate cancer Father        dx late 30s  . Breast cancer Sister 52  . Asthma Sister   . Colon cancer Maternal Aunt        dx in her late 71s  . Cancer Paternal Uncle   . Ovarian cancer Sister 35  . Brain cancer Sister 53  . Alcohol abuse Son   . Lung cancer Maternal Aunt   . Allergic rhinitis Neg Hx   . Angioedema Neg Hx   . Eczema Neg Hx   . Immunodeficiency Neg Hx   . Urticaria Neg Hx      Review of Systems  Constitutional: Negative.   HENT: Negative.   Eyes: Negative.   Respiratory: Negative.   Cardiovascular: Negative.   Gastrointestinal: Negative.   Endocrine: Negative.   Genitourinary: Negative.   Musculoskeletal: Positive for arthralgias, joint swelling and myalgias.  Skin: Negative.   Allergic/Immunologic: Negative.   Neurological: Negative.   Hematological: Negative.   Psychiatric/Behavioral: Negative.     Objective:  Physical Exam Constitutional:      Appearance: Normal appearance. She is normal weight.  HENT:     Head: Normocephalic and atraumatic.     Nose: Nose normal.  Eyes:     Extraocular Movements: Extraocular movements intact.  Neck:     Vascular: No carotid bruit.  Cardiovascular:     Rate and Rhythm: Normal rate and regular rhythm.     Pulses: Normal pulses.     Heart sounds: Normal heart sounds.  Pulmonary:     Effort: Pulmonary effort is normal.     Breath sounds: Normal breath sounds.  Musculoskeletal:        General: Swelling and tenderness present.     Cervical back: Normal range of motion and neck supple.  Skin:     General: Skin is warm and dry.     Capillary Refill: Capillary refill takes less than 2 seconds.  Neurological:     General: No focal deficit present.     Mental Status: She is alert and oriented to person, place, and time.  Psychiatric:  Mood and Affect: Mood normal.        Behavior: Behavior normal.        Thought Content: Thought content normal.        Judgment: Judgment normal.     Vital signs in last 24 hours: Temp:  [97.6 F (36.4 C)] 97.6 F (36.4 C) (12/16 0922) Pulse Rate:  [68] 68 (12/16 0922) Resp:  [16] 16 (12/16 0922) BP: (178)/(66) 178/66 (12/16 0922) SpO2:  [98 %] 98 % (12/16 0922) Weight:  [73.5 kg] 73.5 kg (12/16 0922)  Labs:   Estimated body mass index is 26.96 kg/m as calculated from the following:   Height as of 10/22/20: 5\' 5"  (1.651 m).   Weight as of 10/22/20: 73.5 kg.   Imaging Review Plain radiographs demonstrate moderate degenerative joint disease of the right knee(s). The overall alignment ismild valgus. The bone quality appears to be good for age and reported activity level.      Assessment/Plan:  End stage arthritis, right knee   The patient history, physical examination, clinical judgment of the provider and imaging studies are consistent with end stage degenerative joint disease of the right knee(s) and total knee arthroplasty is deemed medically necessary. The treatment options including medical management, injection therapy arthroscopy and arthroplasty were discussed at length. The risks and benefits of total knee arthroplasty were presented and reviewed. The risks due to aseptic loosening, infection, stiffness, patella tracking problems, thromboembolic complications and other imponderables were discussed. The patient acknowledged the explanation, agreed to proceed with the plan and consent was signed. Patient is being admitted for inpatient treatment for surgery, pain control, PT, OT, prophylactic antibiotics, VTE prophylaxis,  progressive ambulation and ADL's and discharge planning. The patient is planning to be discharged home with home health services     Patient's anticipated LOS is less than 2 midnights, meeting these requirements: - Younger than 7 - Lives within 1 hour of care - Has a competent adult at home to recover with post-op recover - NO history of  - Chronic pain requiring opiods  - Diabetes  - Coronary Artery Disease  - Heart failure  - Heart attack  - Stroke  - DVT/VTE  - Cardiac arrhythmia  - Respiratory Failure/COPD  - Renal failure  - Anemia  - Advanced Liver disease

## 2020-10-26 ENCOUNTER — Other Ambulatory Visit (HOSPITAL_COMMUNITY)
Admission: RE | Admit: 2020-10-26 | Discharge: 2020-10-26 | Disposition: A | Discharge status: Home / Self Care | Payer: Medicare Other | Source: Ambulatory Visit | Attending: Specialist | Admitting: Specialist

## 2020-10-26 DIAGNOSIS — Z20822 Contact with and (suspected) exposure to covid-19: Secondary | ICD-10-CM | POA: Diagnosis not present

## 2020-10-26 DIAGNOSIS — Z01812 Encounter for preprocedural laboratory examination: Secondary | ICD-10-CM | POA: Diagnosis present

## 2020-10-27 ENCOUNTER — Encounter (HOSPITAL_COMMUNITY): Payer: Self-pay | Admitting: Specialist

## 2020-10-27 LAB — SARS CORONAVIRUS 2 (TAT 6-24 HRS): SARS Coronavirus 2: NEGATIVE

## 2020-10-27 MED ORDER — BUPIVACAINE LIPOSOME 1.3 % IJ SUSP
20.0000 mL | INTRAMUSCULAR | Status: DC
  Filled 2020-10-27: qty 20

## 2020-10-27 NOTE — Anesthesia Preprocedure Evaluation (Addendum)
Anesthesia Evaluation  Patient identified by MRN, date of birth, ID band Patient awake    Reviewed: Allergy & Precautions, NPO status , Patient's Chart, lab work & pertinent test results  Airway Mallampati: III  TM Distance: >3 FB Neck ROM: Full    Dental  (+) Teeth Intact, Dental Advisory Given   Pulmonary former smoker,    breath sounds clear to auscultation       Cardiovascular hypertension, Pt. on medications + Peripheral Vascular Disease   Rhythm:Regular Rate:Normal     Neuro/Psych negative neurological ROS  negative psych ROS   GI/Hepatic negative GI ROS, Neg liver ROS,   Endo/Other  negative endocrine ROS  Renal/GU negative Renal ROS     Musculoskeletal  (+) Arthritis ,   Abdominal Normal abdominal exam  (+)   Peds  Hematology negative hematology ROS (+)   Anesthesia Other Findings   Reproductive/Obstetrics                            Anesthesia Physical Anesthesia Plan  ASA: II  Anesthesia Plan: Spinal   Post-op Pain Management:  Regional for Post-op pain   Induction: Intravenous  PONV Risk Score and Plan: 3 and Ondansetron and Propofol infusion  Airway Management Planned: Natural Airway and Simple Face Mask  Additional Equipment: None  Intra-op Plan:   Post-operative Plan:   Informed Consent: I have reviewed the patients History and Physical, chart, labs and discussed the procedure including the risks, benefits and alternatives for the proposed anesthesia with the patient or authorized representative who has indicated his/her understanding and acceptance.       Plan Discussed with: CRNA  Anesthesia Plan Comments: (Lab Results      Component                Value               Date                      WBC                      6.2                 10/22/2020                HGB                      14.7                10/22/2020                HCT                       43.2                10/22/2020                MCV                      91.1                10/22/2020                PLT                      283  10/22/2020            No results found for: INR, PROTIME  EKG: normal sinus rhythm. )       Anesthesia Quick Evaluation

## 2020-10-28 ENCOUNTER — Other Ambulatory Visit: Payer: Self-pay

## 2020-10-28 ENCOUNTER — Ambulatory Visit (HOSPITAL_COMMUNITY): Payer: Medicare Other | Admitting: Certified Registered"

## 2020-10-28 ENCOUNTER — Encounter (HOSPITAL_COMMUNITY)
Admission: RE | Disposition: A | Discharge status: Home / Self Care | Payer: Self-pay | Source: Home / Self Care | Attending: Specialist

## 2020-10-28 ENCOUNTER — Observation Stay (HOSPITAL_COMMUNITY)
Admission: RE | Admit: 2020-10-28 | Discharge: 2020-10-29 | Disposition: A | Discharge status: Home / Self Care | Payer: Medicare Other | Attending: Specialist | Admitting: Specialist

## 2020-10-28 ENCOUNTER — Encounter (HOSPITAL_COMMUNITY): Payer: Self-pay | Admitting: Specialist

## 2020-10-28 DIAGNOSIS — Z87891 Personal history of nicotine dependence: Secondary | ICD-10-CM | POA: Insufficient documentation

## 2020-10-28 DIAGNOSIS — M25561 Pain in right knee: Secondary | ICD-10-CM | POA: Diagnosis present

## 2020-10-28 DIAGNOSIS — Z79899 Other long term (current) drug therapy: Secondary | ICD-10-CM | POA: Insufficient documentation

## 2020-10-28 DIAGNOSIS — I10 Essential (primary) hypertension: Secondary | ICD-10-CM | POA: Diagnosis not present

## 2020-10-28 DIAGNOSIS — M1711 Unilateral primary osteoarthritis, right knee: Principal | ICD-10-CM | POA: Diagnosis present

## 2020-10-28 HISTORY — PX: TOTAL KNEE ARTHROPLASTY: SHX125

## 2020-10-28 LAB — COMPREHENSIVE METABOLIC PANEL
ALT: 19 U/L (ref 0–44)
AST: 20 U/L (ref 15–41)
Albumin: 4.3 g/dL (ref 3.5–5.0)
Alkaline Phosphatase: 65 U/L (ref 38–126)
Anion gap: 11 (ref 5–15)
BUN: 12 mg/dL (ref 8–23)
CO2: 27 mmol/L (ref 22–32)
Calcium: 9.3 mg/dL (ref 8.9–10.3)
Chloride: 98 mmol/L (ref 98–111)
Creatinine, Ser: 0.65 mg/dL (ref 0.44–1.00)
GFR, Estimated: >60 mL/min (ref >60)
Glucose, Bld: 147 mg/dL — ABNORMAL HIGH (ref 70–99)
Potassium: 2.8 mmol/L — ABNORMAL LOW (ref 3.5–5.1)
Sodium: 136 mmol/L (ref 135–145)
Total Bilirubin: 0.8 mg/dL (ref 0.3–1.2)
Total Protein: 7.3 g/dL (ref 6.5–8.1)

## 2020-10-28 LAB — CBC
HCT: 40.3 % (ref 36.0–46.0)
Hemoglobin: 13.6 g/dL (ref 12.0–15.0)
MCH: 31.1 pg (ref 26.0–34.0)
MCHC: 33.7 g/dL (ref 30.0–36.0)
MCV: 92 fL (ref 80.0–100.0)
Platelets: 270 10*3/uL (ref 150–400)
RBC: 4.38 MIL/uL (ref 3.87–5.11)
RDW: 12.8 % (ref 11.5–15.5)
WBC: 6.5 10*3/uL (ref 4.0–10.5)
nRBC: 0 % (ref 0.0–0.2)

## 2020-10-28 LAB — CREATININE, SERUM
Creatinine, Ser: 0.52 mg/dL (ref 0.44–1.00)
GFR, Estimated: >60 mL/min (ref >60)

## 2020-10-28 LAB — PROTIME-INR
INR: 1 (ref 0.8–1.2)
Prothrombin Time: 13.1 seconds (ref 11.4–15.2)

## 2020-10-28 LAB — APTT: aPTT: 30 seconds (ref 24–36)

## 2020-10-28 SURGERY — ARTHROPLASTY, KNEE, TOTAL
Anesthesia: Spinal | Site: Knee | Laterality: Right

## 2020-10-28 MED ORDER — SODIUM CHLORIDE 0.9 % IR SOLN
Status: DC | PRN
  Administered 2020-10-28: 1000 mL

## 2020-10-28 MED ORDER — HYDROMORPHONE HCL 1 MG/ML IJ SOLN
0.2500 mg | INTRAMUSCULAR | Status: DC | PRN
  Administered 2020-10-28 (×4): 0.5 mg via INTRAVENOUS

## 2020-10-28 MED ORDER — OXYCODONE HCL 5 MG PO TABS
5.0000 mg | ORAL_TABLET | ORAL | Status: DC | PRN
  Administered 2020-10-29 (×2): 5 mg via ORAL
  Filled 2020-10-28 (×2): qty 1

## 2020-10-28 MED ORDER — LACTATED RINGERS IV SOLN: INTRAVENOUS | Status: DC

## 2020-10-28 MED ORDER — ACETAMINOPHEN 160 MG/5ML PO SOLN: 325.0000 mg | Freq: Once | ORAL | Status: DC | PRN

## 2020-10-28 MED ORDER — HYDROMORPHONE HCL 1 MG/ML IJ SOLN
INTRAMUSCULAR | Status: AC
  Filled 2020-10-28: qty 1

## 2020-10-28 MED ORDER — ONDANSETRON HCL 4 MG/2ML IJ SOLN
INTRAMUSCULAR | Status: DC | PRN
  Administered 2020-10-28: 4 mg via INTRAVENOUS

## 2020-10-28 MED ORDER — METHOCARBAMOL 500 MG IVPB - SIMPLE MED
INTRAVENOUS | Status: AC
  Filled 2020-10-28: qty 50

## 2020-10-28 MED ORDER — CEFAZOLIN SODIUM-DEXTROSE 2-4 GM/100ML-% IV SOLN
2.0000 g | INTRAVENOUS | Status: AC
  Administered 2020-10-28: 08:00:00 2 g via INTRAVENOUS
  Filled 2020-10-28: qty 100

## 2020-10-28 MED ORDER — SODIUM CHLORIDE 0.9 % IV SOLN: INTRAVENOUS | Status: DC | PRN

## 2020-10-28 MED ORDER — FENTANYL CITRATE (PF) 100 MCG/2ML IJ SOLN
INTRAMUSCULAR | Status: AC
  Filled 2020-10-28: qty 2

## 2020-10-28 MED ORDER — SENNOSIDES-DOCUSATE SODIUM 8.6-50 MG PO TABS: 1.0000 | ORAL_TABLET | Freq: Every evening | ORAL | Status: DC | PRN

## 2020-10-28 MED ORDER — ONDANSETRON HCL 4 MG PO TABS: 4.0000 mg | ORAL_TABLET | Freq: Four times a day (QID) | ORAL | Status: DC | PRN

## 2020-10-28 MED ORDER — MIDAZOLAM HCL 2 MG/2ML IJ SOLN
INTRAMUSCULAR | Status: AC
  Filled 2020-10-28: qty 2

## 2020-10-28 MED ORDER — ONDANSETRON HCL 4 MG/2ML IJ SOLN
4.0000 mg | Freq: Four times a day (QID) | INTRAMUSCULAR | Status: DC | PRN
  Administered 2020-10-28: 11:00:00 4 mg via INTRAVENOUS

## 2020-10-28 MED ORDER — EPHEDRINE 5 MG/ML INJ
INTRAVENOUS | Status: AC
  Filled 2020-10-28: qty 10

## 2020-10-28 MED ORDER — PROPOFOL 10 MG/ML IV BOLUS
INTRAVENOUS | Status: AC
  Filled 2020-10-28: qty 20

## 2020-10-28 MED ORDER — BISACODYL 5 MG PO TBEC: 5.0000 mg | DELAYED_RELEASE_TABLET | Freq: Every day | ORAL | Status: DC | PRN

## 2020-10-28 MED ORDER — DEXAMETHASONE SODIUM PHOSPHATE 10 MG/ML IJ SOLN
INTRAMUSCULAR | Status: AC
  Filled 2020-10-28: qty 1

## 2020-10-28 MED ORDER — SODIUM CHLORIDE (PF) 0.9 % IJ SOLN
INTRAMUSCULAR | Status: AC
Start: 2020-10-28 — End: ?
  Filled 2020-10-28: qty 50

## 2020-10-28 MED ORDER — FENTANYL CITRATE (PF) 100 MCG/2ML IJ SOLN
INTRAMUSCULAR | Status: DC | PRN
  Administered 2020-10-28 (×2): 50 ug via INTRAVENOUS

## 2020-10-28 MED ORDER — DEXAMETHASONE SODIUM PHOSPHATE 10 MG/ML IJ SOLN
8.0000 mg | Freq: Once | INTRAMUSCULAR | Status: AC
  Administered 2020-10-28: 08:00:00 8 mg via INTRAVENOUS

## 2020-10-28 MED ORDER — MIDAZOLAM HCL 2 MG/2ML IJ SOLN
INTRAMUSCULAR | Status: DC | PRN
  Administered 2020-10-28: 2 mg via INTRAVENOUS
  Administered 2020-10-28 (×2): 1 mg via INTRAVENOUS

## 2020-10-28 MED ORDER — HYDROMORPHONE HCL 1 MG/ML IJ SOLN: 0.5000 mg | INTRAMUSCULAR | Status: DC | PRN

## 2020-10-28 MED ORDER — SODIUM CHLORIDE (PF) 0.9 % IJ SOLN
INTRAMUSCULAR | Status: AC
  Filled 2020-10-28: qty 10

## 2020-10-28 MED ORDER — ACETAMINOPHEN 325 MG PO TABS: 325.0000 mg | ORAL_TABLET | Freq: Four times a day (QID) | ORAL | Status: DC | PRN

## 2020-10-28 MED ORDER — ONDANSETRON HCL 4 MG PO TABS: 4.0000 mg | ORAL_TABLET | Freq: Every day | ORAL | 1 refills | Status: AC | PRN

## 2020-10-28 MED ORDER — PROPOFOL 500 MG/50ML IV EMUL
INTRAVENOUS | Status: AC
  Filled 2020-10-28: qty 50

## 2020-10-28 MED ORDER — TRANEXAMIC ACID-NACL 1000-0.7 MG/100ML-% IV SOLN
1000.0000 mg | INTRAVENOUS | Status: AC
  Administered 2020-10-28: 08:00:00 1000 mg via INTRAVENOUS
  Filled 2020-10-28: qty 100

## 2020-10-28 MED ORDER — L-LYSINE 500 MG PO CAPS: 2.0000 | ORAL_CAPSULE | Freq: Every day | ORAL | Status: DC

## 2020-10-28 MED ORDER — ACETAMINOPHEN 500 MG PO TABS
1000.0000 mg | ORAL_TABLET | Freq: Four times a day (QID) | ORAL | Status: AC
  Administered 2020-10-28 – 2020-10-29 (×4): 1000 mg via ORAL
  Filled 2020-10-28 (×4): qty 2

## 2020-10-28 MED ORDER — PROPOFOL 10 MG/ML IV BOLUS
INTRAVENOUS | Status: AC
  Filled 2020-10-28: qty 40

## 2020-10-28 MED ORDER — PROPOFOL 500 MG/50ML IV EMUL
INTRAVENOUS | Status: DC | PRN
  Administered 2020-10-28: 50 ug/kg/min via INTRAVENOUS

## 2020-10-28 MED ORDER — LIDOCAINE 2% (20 MG/ML) 5 ML SYRINGE
INTRAMUSCULAR | Status: DC | PRN
  Administered 2020-10-28: 40 mg via INTRAVENOUS

## 2020-10-28 MED ORDER — DIPHENHYDRAMINE HCL 12.5 MG/5ML PO ELIX
12.5000 mg | ORAL_SOLUTION | ORAL | Status: DC | PRN
Start: 2020-10-28 — End: 2020-10-29

## 2020-10-28 MED ORDER — CEFAZOLIN SODIUM-DEXTROSE 1-4 GM/50ML-% IV SOLN
1.0000 g | Freq: Three times a day (TID) | INTRAVENOUS | Status: AC
  Administered 2020-10-28 – 2020-10-29 (×3): 1 g via INTRAVENOUS
  Filled 2020-10-28 (×3): qty 50

## 2020-10-28 MED ORDER — HYDROCHLOROTHIAZIDE 25 MG PO TABS
25.0000 mg | ORAL_TABLET | Freq: Every day | ORAL | Status: DC
  Administered 2020-10-29: 08:00:00 25 mg via ORAL
  Filled 2020-10-28: qty 1

## 2020-10-28 MED ORDER — ORAL CARE MOUTH RINSE: 15.0000 mL | Freq: Once | OROMUCOSAL | Status: AC

## 2020-10-28 MED ORDER — 0.9 % SODIUM CHLORIDE (POUR BTL) OPTIME
TOPICAL | Status: DC | PRN
  Administered 2020-10-28: 08:00:00 1000 mL

## 2020-10-28 MED ORDER — LIDOCAINE HCL (PF) 2 % IJ SOLN
INTRAMUSCULAR | Status: AC
  Filled 2020-10-28: qty 5

## 2020-10-28 MED ORDER — ACETAMINOPHEN 325 MG PO TABS: 325.0000 mg | ORAL_TABLET | Freq: Once | ORAL | Status: DC | PRN

## 2020-10-28 MED ORDER — TRAMADOL HCL 50 MG PO TABS
50.0000 mg | ORAL_TABLET | Freq: Four times a day (QID) | ORAL | Status: DC
  Administered 2020-10-28 – 2020-10-29 (×5): 50 mg via ORAL
  Filled 2020-10-28 (×5): qty 1

## 2020-10-28 MED ORDER — METHOCARBAMOL 500 MG PO TABS: 500.0000 mg | ORAL_TABLET | Freq: Four times a day (QID) | ORAL | Status: DC | PRN

## 2020-10-28 MED ORDER — ROPIVACAINE HCL 7.5 MG/ML IJ SOLN
INTRAMUSCULAR | Status: DC | PRN
  Administered 2020-10-28: 20 mL via PERINEURAL

## 2020-10-28 MED ORDER — ACETAMINOPHEN 10 MG/ML IV SOLN: 1000.0000 mg | Freq: Once | INTRAVENOUS | Status: DC | PRN

## 2020-10-28 MED ORDER — ONDANSETRON HCL 4 MG/2ML IJ SOLN
INTRAMUSCULAR | Status: AC
  Filled 2020-10-28: qty 2

## 2020-10-28 MED ORDER — OXYCODONE HCL 5 MG PO TABS: 10.0000 mg | ORAL_TABLET | ORAL | Status: DC | PRN

## 2020-10-28 MED ORDER — PROPOFOL 1000 MG/100ML IV EMUL
INTRAVENOUS | Status: AC
  Filled 2020-10-28: qty 100

## 2020-10-28 MED ORDER — POVIDONE-IODINE 10 % EX SWAB
2.0000 "application " | Freq: Once | CUTANEOUS | Status: AC
  Administered 2020-10-28: 2 via TOPICAL

## 2020-10-28 MED ORDER — DILTIAZEM HCL ER COATED BEADS 180 MG PO CP24
180.0000 mg | ORAL_CAPSULE | Freq: Every day | ORAL | Status: DC
  Administered 2020-10-29: 08:00:00 180 mg via ORAL
  Filled 2020-10-28: qty 1

## 2020-10-28 MED ORDER — CHLORHEXIDINE GLUCONATE 0.12 % MT SOLN
15.0000 mL | Freq: Once | OROMUCOSAL | Status: AC
  Administered 2020-10-28: 06:00:00 15 mL via OROMUCOSAL

## 2020-10-28 MED ORDER — AMISULPRIDE (ANTIEMETIC) 5 MG/2ML IV SOLN: 10.0000 mg | Freq: Once | INTRAVENOUS | Status: DC | PRN

## 2020-10-28 MED ORDER — MAGNESIUM CITRATE PO SOLN: 1.0000 | Freq: Once | ORAL | Status: DC | PRN

## 2020-10-28 MED ORDER — OXYCODONE HCL 5 MG PO TABS: 5.0000 mg | ORAL_TABLET | ORAL | 0 refills | Status: AC | PRN

## 2020-10-28 MED ORDER — MEPERIDINE HCL 50 MG/ML IJ SOLN: 6.2500 mg | INTRAMUSCULAR | Status: DC | PRN

## 2020-10-28 MED ORDER — GABAPENTIN 300 MG PO CAPS
300.0000 mg | ORAL_CAPSULE | Freq: Three times a day (TID) | ORAL | Status: DC
  Administered 2020-10-28 – 2020-10-29 (×3): 300 mg via ORAL
  Filled 2020-10-28 (×3): qty 1

## 2020-10-28 MED ORDER — ENOXAPARIN SODIUM 40 MG/0.4ML ~~LOC~~ SOLN
40.0000 mg | SUBCUTANEOUS | Status: DC
  Administered 2020-10-29: 07:00:00 40 mg via SUBCUTANEOUS
  Filled 2020-10-28: qty 0.4

## 2020-10-28 MED ORDER — FLUTICASONE PROPIONATE 50 MCG/ACT NA SUSP: 2.0000 | Freq: Every day | NASAL | Status: DC | PRN

## 2020-10-28 MED ORDER — IRRISEPT - 450ML BOTTLE WITH 0.05% CHG IN STERILE WATER, USP 99.95% OPTIME
TOPICAL | Status: DC | PRN
  Administered 2020-10-28: 08:00:00 450 mL via TOPICAL

## 2020-10-28 MED ORDER — PROPOFOL 10 MG/ML IV BOLUS
INTRAVENOUS | Status: DC | PRN
  Administered 2020-10-28: 50 mg via INTRAVENOUS

## 2020-10-28 MED ORDER — EPHEDRINE SULFATE-NACL 50-0.9 MG/10ML-% IV SOSY
PREFILLED_SYRINGE | INTRAVENOUS | Status: DC | PRN
  Administered 2020-10-28 (×6): 10 mg via INTRAVENOUS

## 2020-10-28 MED ORDER — SODIUM CHLORIDE 0.9 % IV SOLN: INTRAVENOUS | Status: DC

## 2020-10-28 MED ORDER — METHOCARBAMOL 500 MG PO TABS: 500.0000 mg | ORAL_TABLET | Freq: Four times a day (QID) | ORAL | 0 refills | Status: DC

## 2020-10-28 MED ORDER — METHOCARBAMOL 500 MG IVPB - SIMPLE MED
500.0000 mg | Freq: Four times a day (QID) | INTRAVENOUS | Status: DC | PRN
  Administered 2020-10-28: 10:00:00 500 mg via INTRAVENOUS
  Filled 2020-10-28: qty 50

## 2020-10-28 MED ORDER — BUPIVACAINE IN DEXTROSE 0.75-8.25 % IT SOLN
INTRATHECAL | Status: DC | PRN
  Administered 2020-10-28: 1.6 mL via INTRATHECAL

## 2020-10-28 SURGICAL SUPPLY — 66 items
ATTUNE PSFEM RTSZ6 NARCEM KNEE (Femur) ×3 IMPLANT
ATTUNE PSRP INSR SZ6 5 KNEE (Insert) ×2 IMPLANT
ATTUNE PSRP INSR SZ6 5MM KNEE (Insert) ×1 IMPLANT
BAG ZIPLOCK 12X15 (MISCELLANEOUS) ×3 IMPLANT
BASE TIBIA ATTUNE KNEE SYS SZ6 (Knees) ×1 IMPLANT
BLADE SAG 18X100X1.27 (BLADE) ×3 IMPLANT
BLADE SAW SGTL 11.0X1.19X90.0M (BLADE) ×3 IMPLANT
BNDG ELASTIC 4X5.8 VLCR STR LF (GAUZE/BANDAGES/DRESSINGS) ×3 IMPLANT
BNDG ELASTIC 6X5.8 VLCR STR LF (GAUZE/BANDAGES/DRESSINGS) ×3 IMPLANT
BONE CEMENT GENTAMICIN (Cement) ×6 IMPLANT
BOWL SMART MIX CTS (DISPOSABLE) ×3 IMPLANT
CEMENT BONE GENTAMICIN 40 (Cement) ×2 IMPLANT
CEMENT HV SMART SET (Cement) IMPLANT
COVER SURGICAL LIGHT HANDLE (MISCELLANEOUS) ×3 IMPLANT
COVER WAND RF STERILE (DRAPES) ×3 IMPLANT
CUFF TOURN SGL QUICK 34 (TOURNIQUET CUFF) ×2
CUFF TRNQT CYL 34X4.125X (TOURNIQUET CUFF) ×1 IMPLANT
DECANTER SPIKE VIAL GLASS SM (MISCELLANEOUS) ×3 IMPLANT
DERMABOND ADVANCED (GAUZE/BANDAGES/DRESSINGS) ×2
DERMABOND ADVANCED .7 DNX12 (GAUZE/BANDAGES/DRESSINGS) ×1 IMPLANT
DRAPE U-SHAPE 47X51 STRL (DRAPES) ×3 IMPLANT
DRSG AQUACEL AG ADV 3.5X10 (GAUZE/BANDAGES/DRESSINGS) ×3 IMPLANT
DRSG TEGADERM 4X4.75 (GAUZE/BANDAGES/DRESSINGS) ×3 IMPLANT
DURAPREP 26ML APPLICATOR (WOUND CARE) ×6 IMPLANT
ELECT REM PT RETURN 15FT ADLT (MISCELLANEOUS) ×3 IMPLANT
EVACUATOR 1/8 PVC DRAIN (DRAIN) ×3 IMPLANT
GAUZE SPONGE 2X2 8PLY STRL LF (GAUZE/BANDAGES/DRESSINGS) ×1 IMPLANT
GLOVE BIOGEL PI IND STRL 7.5 (GLOVE) ×1 IMPLANT
GLOVE BIOGEL PI IND STRL 8 (GLOVE) ×1 IMPLANT
GLOVE BIOGEL PI INDICATOR 7.5 (GLOVE) ×2
GLOVE BIOGEL PI INDICATOR 8 (GLOVE) ×2
GLOVE ECLIPSE 8.0 STRL XLNG CF (GLOVE) ×3 IMPLANT
GLOVE SURG ORTHO 9.0 STRL STRW (GLOVE) ×3 IMPLANT
GLOVE SURG SS PI 7.0 STRL IVOR (GLOVE) ×3 IMPLANT
GOWN STRL REUS W/TWL XL LVL3 (GOWN DISPOSABLE) ×6 IMPLANT
HANDPIECE INTERPULSE COAX TIP (DISPOSABLE) ×2
JET LAVAGE IRRISEPT WOUND (IRRIGATION / IRRIGATOR) ×3
KIT TURNOVER KIT A (KITS) ×3 IMPLANT
LAVAGE JET IRRISEPT WOUND (IRRIGATION / IRRIGATOR) ×1 IMPLANT
NS IRRIG 1000ML POUR BTL (IV SOLUTION) ×3 IMPLANT
PACK TOTAL KNEE CUSTOM (KITS) ×3 IMPLANT
PATELLA MEDIAL ATTUN 35MM KNEE (Knees) ×3 IMPLANT
PENCIL SMOKE EVACUATOR (MISCELLANEOUS) IMPLANT
PIN DRILL FIX HALF THREAD (BIT) ×3 IMPLANT
PIN STEINMAN FIXATION KNEE (PIN) ×3 IMPLANT
PROTECTOR NERVE ULNAR (MISCELLANEOUS) ×3 IMPLANT
SET HNDPC FAN SPRY TIP SCT (DISPOSABLE) ×1 IMPLANT
SET PAD KNEE POSITIONER (MISCELLANEOUS) ×3 IMPLANT
SPONGE GAUZE 2X2 STER 10/PKG (GAUZE/BANDAGES/DRESSINGS) ×2
SPONGE LAP 18X18 RF (DISPOSABLE) IMPLANT
SPONGE SURGIFOAM ABS GEL 100 (HEMOSTASIS) ×3 IMPLANT
STOCKINETTE 6  STRL (DRAPES) ×2
STOCKINETTE 6 STRL (DRAPES) ×1 IMPLANT
SUT BONE WAX W31G (SUTURE) IMPLANT
SUT MNCRL AB 3-0 PS2 18 (SUTURE) ×3 IMPLANT
SUT VIC AB 1 CT1 27 (SUTURE) ×6
SUT VIC AB 1 CT1 27XBRD ANTBC (SUTURE) ×3 IMPLANT
SUT VIC AB 2-0 CT1 27 (SUTURE) ×4
SUT VIC AB 2-0 CT1 TAPERPNT 27 (SUTURE) ×2 IMPLANT
SUT VLOC 180 0 24IN GS25 (SUTURE) ×3 IMPLANT
SYR 50ML LL SCALE MARK (SYRINGE) ×3 IMPLANT
TAPE STRIPS DRAPE STRL (GAUZE/BANDAGES/DRESSINGS) ×3 IMPLANT
TIBIA ATTUNE KNEE SYS BASE SZ6 (Knees) ×3 IMPLANT
TRAY CATH 16FR W/PLASTIC CATH (SET/KITS/TRAYS/PACK) ×3 IMPLANT
WATER STERILE IRR 1000ML POUR (IV SOLUTION) ×6 IMPLANT
WRAP KNEE MAXI GEL POST OP (GAUZE/BANDAGES/DRESSINGS) ×3 IMPLANT

## 2020-10-28 NOTE — Anesthesia Procedure Notes (Signed)
Anesthesia Regional Block: Adductor canal block   Pre-Anesthetic Checklist: ,, timeout performed, Correct Patient, Correct Site, Correct Laterality, Correct Procedure, Correct Position, site marked, Risks and benefits discussed,  Surgical consent,  Pre-op evaluation,  At surgeon's request and post-op pain management  Laterality: Right  Prep: chloraprep       Needles:  Injection technique: Single-shot  Needle Type: Echogenic Stimulator Needle     Needle Length: 9cm  Needle Gauge: 21     Additional Needles:   Procedures:,,,, ultrasound used (permanent image in chart),,,,  Narrative:  Start time: 10/28/2020 7:00 AM End time: 10/28/2020 7:05 AM Injection made incrementally with aspirations every 5 mL.  Performed by: Personally  Anesthesiologist: Effie Berkshire, MD  Additional Notes: Patient tolerated the procedure well. Local anesthetic introduced in an incremental fashion under minimal resistance after negative aspirations. No paresthesias were elicited. After completion of the procedure, no acute issues were identified and patient continued to be monitored by RN.

## 2020-10-28 NOTE — Evaluation (Signed)
Physical Therapy Evaluation Patient Details Name: Shelby Mcguire MRN: [id number] DOB: 1945/12/15 Today's Date: 10/28/2020   History of Present Illness  Patient is 74 y.o. female s/p Rt TKA on 10/28/20 with PMH significant for HTN, PVD, OA.  Clinical Impression  NALEAH KOFOED is a 74 y.o. female POD 0 s/p Rt TKA. Patient reports independence with mobility at baseline. Patient is now limited by functional impairments (see PT problem list below) and requires min assist for transfers and gait with RW. Patient was able to ambulate ~8 feet with RW and min assist; distance limited by c/o dizziness and lethargy. VSS. Patient instructed in exercise to facilitate ROM and circulation. Patient will benefit from continued skilled PT interventions to address impairments and progress towards PLOF. Acute PT will follow to progress mobility and stair training in preparation for safe discharge home.     Follow Up Recommendations Follow surgeon's recommendation for DC plan and follow-up therapies;Outpatient PT    Equipment Recommendations  None recommended by PT    Recommendations for Other Services       Precautions / Restrictions Precautions Precautions: Fall Restrictions Weight Bearing Restrictions: No Other Position/Activity Restrictions: WBAT      Mobility  Bed Mobility Overal bed mobility: Needs Assistance Bed Mobility: Supine to Sit     Supine to sit: Min guard;HOB elevated     General bed mobility comments: no assist needed for Rt LE, VC's for pt to use bed rail to raise trunk.    Transfers Overall transfer level: Needs assistance Equipment used: Rolling walker (2 wheeled) Transfers: Sit to/from [facility] Sit to Stand: Min assist Stand pivot transfers: Min assist       General transfer comment: VC's for technique with RW, assist for power up and to steady in standing. cues for step sequencing and assist to manage RW during turn to  [facility].  Ambulation/Gait Ambulation/Gait assistance: Min assist Gait Distance (Feet): 8 Feet Assistive device: Rolling walker (2 wheeled) Gait Pattern/deviations: Step-to pattern;Decreased stride length Gait velocity: decr   General Gait Details: VC's for safe step pattern and proximity to RW, assist to steady while pt took small steps forward for gait in room. pt c/o lightheadedness however BP stable. 136/63 and 122/57 with HR in 60's.  Stairs            Wheelchair Mobility    Modified Rankin (Stroke Patients Only)       Balance                                             Pertinent Vitals/Pain Pain Assessment: 0-10 Pain Score: 0-No pain Pain Location: Rt knee Pain Intervention(s): Limited activity within patient's tolerance;Monitored during session;Repositioned;Ice applied    Home Living Family/patient expects to be discharged to:: Private residence Living Arrangements: Children Available Help at Discharge: Family Type of Home: House Home Access: Stairs to enter Entrance Stairs-Rails: None Entrance Stairs-Number of Steps: 1 Home Layout: Two level;Full bath on main level;Able to live on main level with bedroom/bathroom Home Equipment: Gilford Rile - 2 wheels;Cane - single point;Bedside commode;Shower seat Additional Comments: pt lives on main level and does not go upstairs. her daughter is abel to assist her at home.    Prior Function Level of Independence: Independent         Comments: pt enjoys sewing and working at [employer]  Hand Dominance   Dominant Hand: Right    Extremity/Trunk Assessment   Upper Extremity Assessment Upper Extremity Assessment: Overall WFL for tasks assessed    Lower Extremity Assessment Lower Extremity Assessment: RLE deficits/detail RLE Deficits / Details: good quad activation, no extensor lag with SLR, 3/5 or better. RLE Sensation: WNL RLE Coordination: WNL    Cervical / Trunk  Assessment Cervical / Trunk Assessment: Normal  Communication   Communication: No difficulties  Cognition Arousal/Alertness: Awake/alert Behavior During Therapy: WFL for tasks assessed/performed Overall Cognitive Status: Within Functional Limits for tasks assessed                                        General Comments      Exercises Total Joint Exercises Ankle Circles/Pumps: AROM;Both;20 reps;Seated Quad Sets: AROM;Right;10 reps;Seated Heel Slides: AROM;Right;10 reps;Seated   Assessment/Plan    PT Assessment Patient needs continued PT services  PT Problem List Decreased strength;Decreased range of motion;Decreased activity tolerance;Decreased balance;Decreased mobility;Decreased knowledge of use of DME;Decreased knowledge of precautions       PT Treatment Interventions DME instruction;Gait training;Stair training;Functional mobility training;Therapeutic activities;Therapeutic exercise;Balance training;Patient/family education    PT Goals (Current goals can be found in the Care Plan section)  Acute Rehab PT Goals Patient Stated Goal: be able to sew again on sewing machine PT Goal Formulation: With patient Time For Goal Achievement: 11/04/20 Potential to Achieve Goals: Good    Frequency 7X/week   Barriers to discharge        Co-evaluation               AM-PAC PT "6 Clicks" Mobility  Outcome Measure Help needed turning from your back to your side while in a flat bed without using bedrails?: None Help needed moving from lying on your back to sitting on the side of a flat bed without using bedrails?: A Little Help needed moving to and from a bed to a chair (including a wheelchair)?: A Little Help needed standing up from a chair using your arms (e.g., wheelchair or bedside chair)?: A Little Help needed to walk in hospital room?: A Little Help needed climbing 3-5 steps with a railing? : A Little 6 Click Score: 19    End of Session Equipment  Utilized During Treatment: Gait belt Activity Tolerance: Patient tolerated treatment well Patient left: in chair;with call bell/phone within reach;with chair alarm set Nurse Communication: Mobility status PT Visit Diagnosis: Muscle weakness (generalized) (M62.81);Difficulty in walking, not elsewhere classified (R26.2)    Time: [id number] PT Time Calculation (min) (ACUTE ONLY): 26 min   Charges:   PT Evaluation $PT Eval Low Complexity: 1 Low PT Treatments $Therapeutic Exercise: 8-22 mins        Verner Mould, DPT Acute Rehabilitation Services Office [phone] Pager [phone]    Jacques Navy 10/28/2020, 2:49 PM

## 2020-10-28 NOTE — Anesthesia Postprocedure Evaluation (Signed)
Anesthesia Post Note  Patient: Kimber Relic  Procedure(s) Performed: TOTAL KNEE ARTHROPLASTY (Right Knee)     Patient location during evaluation: PACU Anesthesia Type: Spinal Level of consciousness: oriented and awake and alert Pain management: pain level controlled Vital Signs Assessment: post-procedure vital signs reviewed and stable Respiratory status: spontaneous breathing, respiratory function stable and patient connected to nasal cannula oxygen Cardiovascular status: blood pressure returned to baseline and stable Postop Assessment: no headache, no backache, no apparent nausea or vomiting and spinal receding Anesthetic complications: no   No complications documented.  Last Vitals:  Vitals:   10/28/20 1328 10/28/20 1425  BP: (!) 126/52 (!) 127/54  Pulse: (!) 54 (!) 59  Resp: 18 18  Temp: 36.6 C (!) 36.4 C  SpO2: 100% 97%    Last Pain:  Vitals:   10/28/20 1425  TempSrc: Oral  PainSc:                  Effie Berkshire

## 2020-10-28 NOTE — Anesthesia Procedure Notes (Signed)
Spinal  Start time: 10/28/2020 7:50 AM End time: 10/28/2020 7:52 AM Staffing Performed: anesthesiologist  Anesthesiologist: Effie Berkshire, MD Preanesthetic Checklist Completed: patient identified, IV checked, site marked, risks and benefits discussed, surgical consent, monitors and equipment checked, pre-op evaluation and timeout performed Spinal Block Patient position: sitting Prep: DuraPrep and site prepped and draped Location: L3-4 Injection technique: single-shot Needle Needle type: Pencan  Needle gauge: 24 G Needle length: 10 cm Needle insertion depth: 10 cm Additional Notes Patient tolerated well. No immediate complications.

## 2020-10-28 NOTE — Progress Notes (Signed)
PHARMACIST - PHYSICIAN ORDER COMMUNICATION  CONCERNING: P&T Medication Policy on Herbal Medications  DESCRIPTION:  This patient's order for:  L-lysine  has been noted.  This product(s) is classified as an "herbal" or natural product. Due to a lack of definitive safety studies or FDA approval, nonstandard manufacturing practices, plus the potential risk of unknown drug-drug interactions while on inpatient medications, the Pharmacy and Therapeutics Committee does not permit the use of "herbal" or natural products of this type within [facility].   ACTION TAKEN: The pharmacy department is unable to verify this order at this time and your patient has been informed of this safety policy. Please reevaluate patient's clinical condition at discharge and address if the herbal or natural product(s) should be resumed at that time.  Peggyann Juba, PharmD, BCPS 10/28/2020 7:52 AM Pharmacy: [phone]

## 2020-10-28 NOTE — Anesthesia Procedure Notes (Signed)
Procedure Name: MAC Date/Time: 10/28/2020 7:47 AM Performed by: Eben Burow, CRNA Pre-anesthesia Checklist: Patient identified, Emergency Drugs available, Suction available, Patient being monitored and Timeout performed Oxygen Delivery Method: Simple face mask Placement Confirmation: positive ETCO2

## 2020-10-28 NOTE — Transfer of Care (Signed)
Immediate Anesthesia Transfer of Care Note  Patient: Shelby Mcguire  Procedure(s) Performed: TOTAL KNEE ARTHROPLASTY (Right Knee)  Patient Location: PACU  Anesthesia Type:Spinal  Level of Consciousness: awake, alert  and patient cooperative  Airway & Oxygen Therapy: Patient Spontanous Breathing and Patient connected to face mask oxygen  Post-op Assessment: Report given to RN and Post -op Vital signs reviewed and stable  Post vital signs: Reviewed and stable  Last Vitals:  Vitals Value Taken Time  BP 122/97 10/28/20 1003  Temp    Pulse 72 10/28/20 1005  Resp 15 10/28/20 1005  SpO2 99 % 10/28/20 1005  Vitals shown include unvalidated device data.  Last Pain:  Vitals:   10/28/20 0641  TempSrc: Oral  PainSc:          Complications: No complications documented.

## 2020-10-28 NOTE — Interval H&P Note (Signed)
History and Physical Interval Note:  10/28/2020 7:44 AM  Shelby Mcguire  has presented today for surgery, with the diagnosis of Right knee osteoarthritis.  The various methods of treatment have been discussed with the patient and family. After consideration of risks, benefits and other options for treatment, the patient has consented to  Procedure(s) with comments: TOTAL KNEE ARTHROPLASTY (Right) - with adductor canal as a surgical intervention.  The patient's history has been reviewed, patient examined, no change in status, stable for surgery.  I have reviewed the patient's chart and labs.  Questions were answered to the patient's satisfaction.     Howell Groesbeck [name]

## 2020-10-28 NOTE — Op Note (Signed)
DATE OF SURGERY:  10/28/2020  TIME: 10:21 AM  PATIENT NAME:  Shelby Mcguire    AGE: 74 y.o.   PRE-OPERATIVE DIAGNOSIS:  Right knee osteoarthritis  POST-OPERATIVE DIAGNOSIS:  Right knee osteoarthritis  PROCEDURE:  Procedure(s): TOTAL KNEE ARTHROPLASTY  SURGEON:  Ivee Poellnitz [name]  ASSISTANT:  Leeanne Haus, PA-C, present and scrubbed throughout the case, critical for assistance with exposure, retraction, instrumentation, and closure.  OPERATIVE IMPLANTS: Depuy PFC Attune Rotating Platform.  Femur size 6, Tibia size 6, Patella size 32 3-peg oval button, with a 5 mm polyethylene insert.   PREOPERATIVE INDICATIONS:   Shelby Mcguire is a 74 y.o. year old female with end stage bone on bone arthritis of the knee who failed conservative treatment and elected for Total Knee Arthroplasty.   The risks, benefits, and alternatives were discussed at length including but not limited to the risks of infection, bleeding, nerve injury, stiffness, blood clots, the need for revision surgery, cardiopulmonary complications, among others, and they were willing to proceed.  OPERATIVE DESCRIPTION:  The patient was brought to the operative room and placed in a supine position.  Spinal anesthesia was administered.  IV antibiotics were given.  The lower extremity was prepped and draped in the usual sterile fashion.  Time out was performed.  The leg was elevated and exsanguinated and the tourniquet was inflated.  Anterior quadriceps tendon splitting approach was performed.  The patella was retracted and osteophytes were removed.  The anterior horn of the medial and lateral meniscus was removed and cruciate ligaments resected.   The distal femur was opened with the drill and the intramedullary distal femoral cutting jig was utilized, set at 5 degrees resecting 10 mm off the distal femur.  Care was taken to protect the collateral ligaments.  The distal femoral sizing jig was applied, taking care to  avoid notching.  Then the 4-in-1 cutting jig was applied and the anterior and posterior femur was cut, along with the chamfer cuts.    Then the extramedullary tibial cutting jig was utilized making the appropriate cut using the anterior tibial crest as a reference building in appropriate posterior slope.  Care was taken during the cut to protect the medial and collateral ligaments.  The proximal tibia was removed along with the posterior horns of the menisci.   The posterior medial femoral osteophytes and posterior lateral femoral osteophytes were removed.    The flexion gap was then measured and was symmetric with the extension gap, measured at 5.  I completed the distal femoral preparation using the appropriate jig to prepare the box.  The patella was then measured, and cut with the saw.    The proximal tibia sized and prepared accordingly with the reamer and the punch, and then all components were trialed with the trial insert.  The knee was found to have excellent balance and full motion.    The above named components were then cemented into place and all excess cement was removed.  The trial polyethylene component was in place during cementation, and then was exchanged for the real polyethylene component.    The knee was easily taken through a range of motion and the patella tracked well and the knee irrigated copiously and the parapatellar and subcutaneous tissue closed with vicryl, and monocryl with steri strips for the skin.  The arthrotomy was closed at 90 of flexion. The wounds were dressed with sterile gauze and the tourniquet released and the patient was awakened and returned to the PACU in  stable and satisfactory condition.  There were no complications.  Total tourniquet time was 70 minutes.

## 2020-10-29 ENCOUNTER — Encounter (HOSPITAL_COMMUNITY): Payer: Self-pay | Admitting: Specialist

## 2020-10-29 DIAGNOSIS — M1711 Unilateral primary osteoarthritis, right knee: Secondary | ICD-10-CM | POA: Diagnosis not present

## 2020-10-29 LAB — NO BLOOD PRODUCTS

## 2020-10-29 NOTE — Progress Notes (Signed)
Physical Therapy Treatment Patient Details Name: Shelby Mcguire MRN: [id number] DOB: 27-Jul-1946 Today's Date: 10/29/2020    History of Present Illness Patient is 74 y.o. female s/p Rt TKA on 10/28/20 with PMH significant for HTN, PVD, OA.    PT Comments    Patient is progressing well with acute PT and increased ambulation distance today to ~180'. She required assist to manage walker in turns but maintained safe proximity with forward gait and step through pattern. Educated on safe Rt LE positioning for toilet transfers and min guard provided for safety with stand. Pt completed seated exercises for ROM and strength form HEP. Acute PT will follow for additional session to progress stair training and HEP review prior to discharge home.   Follow Up Recommendations  Follow surgeons recommendation for DC plan and follow-up therapies;Outpatient PT     Equipment Recommendations  None recommended by PT    Recommendations for Other Services       Precautions / Restrictions Precautions Precautions: Fall Restrictions Weight Bearing Restrictions: No Other Position/Activity Restrictions: WBAT    Mobility  Bed Mobility Overal bed mobility: Needs Assistance Bed Mobility: Supine to Sit     Supine to sit: HOB elevated;Supervision     General bed mobility comments: pt taking increased time, use of bed rail, no assist needed.  Transfers Overall transfer level: Needs assistance Equipment used: Rolling walker (2 wheeled) Transfers: Sit to/from Stand Sit to Stand: Min guard         General transfer comment: VC for hand placement, no assist needed for power up from EOB or toilet. cues to extend Rt LE  to prevent excessive flexion when sitting on toilet.  Ambulation/Gait Ambulation/Gait assistance: Min assist Gait Distance (Feet): 180 Feet Assistive device: Rolling walker (2 wheeled) Gait Pattern/deviations: Step-to pattern;Decreased stride length;Step-through pattern Gait  velocity: decr   General Gait Details: VC's for safe step pattern and pt progressed to step through with no overt LOB or buckling at Rt knee. pt maintained safe position to walker with assist to manage for turns.   Stairs             Wheelchair Mobility    Modified Rankin (Stroke Patients Only)       Balance Overall balance assessment: Needs assistance Sitting-balance support: Feet supported Sitting balance-Leahy Scale: Good     Standing balance support: During functional activity;Bilateral upper extremity supported Standing balance-Leahy Scale: Fair                              Cognition Arousal/Alertness: Awake/alert Behavior During Therapy: WFL for tasks assessed/performed Overall Cognitive Status: Within Functional Limits for tasks assessed                                        Exercises Total Joint Exercises Ankle Circles/Pumps: AROM;Both;20 reps;Seated Long Arc Quad: AROM;Right;10 reps;Seated Knee Flexion: AROM;Right;10 reps;Seated    General Comments        Pertinent Vitals/Pain Pain Assessment: 0-10 Pain Score: 0-No pain Pain Location: Rt knee Pain Intervention(s): Limited activity within patient's tolerance;Monitored during session;Repositioned    Home Living                      Prior Function            PT Goals (current goals can now be found in  the care plan section) Acute Rehab PT Goals Patient Stated Goal: be able to sew again on sewing machine PT Goal Formulation: With patient Time For Goal Achievement: 11/04/20 Potential to Achieve Goals: Good Progress towards PT goals: Progressing toward goals    Frequency    7X/week      PT Plan Current plan remains appropriate    Co-evaluation              AM-PAC PT "6 Clicks" Mobility   Outcome Measure  Help needed turning from your back to your side while in a flat bed without using bedrails?: None Help needed moving from lying on your  back to sitting on the side of a flat bed without using bedrails?: A Little Help needed moving to and from a bed to a chair (including a wheelchair)?: A Little Help needed standing up from a chair using your arms (e.g., wheelchair or bedside chair)?: A Little Help needed to walk in hospital room?: A Little Help needed climbing 3-5 steps with a railing? : A Little 6 Click Score: 19    End of Session Equipment Utilized During Treatment: Gait belt Activity Tolerance: Patient tolerated treatment well Patient left: in chair;with call bell/phone within reach;with chair alarm set Nurse Communication: Mobility status PT Visit Diagnosis: Muscle weakness (generalized) (M62.81);Difficulty in walking, not elsewhere classified (R26.2)     Time: 8099-8338 PT Time Calculation (min) (ACUTE ONLY): 27 min  Charges:  $Gait Training: 8-22 mins $Therapeutic Exercise: 8-22 mins                     Verner Mould, DPT Acute Rehabilitation Services Office [phone] Pager [phone]     Jacques Navy 10/29/2020, 10:42 AM

## 2020-10-29 NOTE — Progress Notes (Signed)
Writer messaged PA regarding K+ level of 2.8, pt is asymptomatic. No new orders received. Will notify again if pt becomes symptomatic.

## 2020-10-29 NOTE — Discharge Summary (Signed)
Physician Discharge Summary  Patient ID: Shelby Mcguire MRN: [id number] DOB/AGE: 02/15/1946 74 y.o.  Admit date: 10/28/2020 Discharge date: 10/29/2020  Admission Diagnoses: Right knee osteoarthritis  Discharge Diagnoses:  Active Problems:   Osteoarthritis of right knee   Discharged Condition: good  Hospital Course: Patient was admitted on 12/22 for a right total knee arthroplasty due to end stage osteoarthritis of her right knee. She tolerated surgery well. She was sent to PACU and the postop floor in stable condition. She worked with PT the first night and was able to get up and ambulate a few feet but limited due to dizziness. She had no events over night. Postop day 1, patient doing well. Was able to get up with therapy. Was discharge home in the afternoon. Patient was sent home with oxycodone, robaxin and promethazine.   Consults: None  Significant Diagnostic Studies: none  Treatments: IV hydration, antibiotics: Ancef, analgesia: acetaminophen and oxycodone, anticoagulation: Lovenox, therapies: PT and surgery: Right Total knee arthroplasty.  Discharge Exam: Blood pressure 118/61, pulse 65, temperature 97.7 F (36.5 C), temperature source Oral, resp. rate 16, height 5\' 5"  (1.651 m), weight 73.5 kg, SpO2 98 %. General appearance: alert, cooperative, appears stated age and no distress Extremities: extremities normal, atraumatic, no cyanosis or edema, right leg in ace bandage. Limited ROM of right knee, able to dorsiflex and plantar flex right ankle with minimal pain.  Pulses: 2+ and symmetric Skin: Skin color, texture, turgor normal. No rashes or lesions Neurologic: Grossly normal Incision/Wound: dressings clean dry and intact  Disposition: Discharge disposition: 01-Home or Self Care       Discharge Instructions    Call MD / Call 911   Complete by: As directed    If you experience chest pain or shortness of breath, CALL 911 and be transported to the hospital  emergency room.  If you develope a fever above 101 F, pus (white drainage) or increased drainage or redness at the wound, or calf pain, call your surgeon's office.   Constipation Prevention   Complete by: As directed    Drink plenty of fluids.  Prune juice may be helpful.  You may use a stool softener, such as Colace (over the counter) 100 mg twice a day.  Use MiraLax (over the counter) for constipation as needed.   Diet - low sodium heart healthy   Complete by: As directed    Discharge instructions   Complete by: As directed    Dr. [facility] [address] [place], [place] [phone]  TOTAL KNEE REPLACEMENT POSTOPERATIVE DIRECTIONS  Knee Rehabilitation, Guidelines Following Surgery  Results after knee surgery are often greatly improved when you follow the exercise, range of motion and muscle strengthening exercises prescribed by your doctor. Safety measures are also important to protect the knee from further injury. Any time any of these exercises cause you to have increased pain or swelling in your knee joint, decrease the amount until you are comfortable again and slowly increase them. If you have problems or questions, call your caregiver or physical therapist for advice.   HOME CARE INSTRUCTIONS  Remove items at home which could result in a fall. This includes throw rugs or furniture in walking pathways.  ICE to the affected knee every three hours for 30 minutes at a time and then as needed for pain and swelling.  Continue to use ice on the knee for pain and swelling from surgery. You may notice swelling that will progress down  to the foot and ankle.  This is normal after surgery.  Elevate the leg when you are not up walking on it.   Continue to use the breathing machine which will help keep your temperature down.  It is common for your temperature to cycle up and down following surgery, especially at night when you are not up moving around and  exerting yourself.  The breathing machine keeps your lungs expanded and your temperature down. Do not place pillow under knee, focus on keeping the knee straight while resting   DIET You may resume your previous home diet once your are discharged from the hospital.  DRESSING / WOUND CARE / SHOWERING Keep the surgical dressing until follow up.  The dressing is water proof, but you need to put extra covering over it like plastic wrap.  IF THE DRESSING FALLS OFF or the wound gets wet inside, change the dressing with sterile gauze.  Please use good hand washing techniques before changing the dressing.  Do not use any lotions or creams on the incision until instructed by your surgeon.   You may start showering once you are discharged home but do not submerge the incision under water.  You are sent home with an ACE bandage on over the leg, this can be removed at 3 days from surgery. At this time you can start showering. Please place the white TED stocking on the surgical leg after. This needs to be worn on the surgical leg for 2 weeks after surgery.   ACTIVITY Walk with your walker as instructed. Use walker as long as suggested by your caregivers. Avoid periods of inactivity such as sitting longer than an hour when not asleep. This helps prevent blood clots.  You may resume a sexual relationship in one month or when given the OK by your doctor.  You may return to work once you are cleared by your doctor.  Do not drive a car for 6 weeks or until released by you surgeon.  Do not drive while taking narcotics.  WEIGHT BEARING Weight bearing as tolerated with assist device (walker, cane, etc) as directed, use it as long as suggested by your surgeon or therapist, typically at least 4-6 weeks.  POSTOPERATIVE CONSTIPATION PROTOCOL Constipation - defined medically as fewer than three stools per week and severe constipation as less than one stool per week.  One of the most common issues patients have  following surgery is constipation.  Even if you have a regular bowel pattern at home, your normal regimen is likely to be disrupted due to multiple reasons following surgery.  Combination of anesthesia, postoperative narcotics, change in appetite and fluid intake all can affect your bowels.  In order to avoid complications following surgery, here are some recommendations in order to help you during your recovery period.  Colace (docusate) - Pick up an over-the-counter form of Colace or another stool softener and take twice a day as long as you are requiring postoperative pain medications.  Take with a full glass of water daily.  If you experience loose stools or diarrhea, hold the colace until you stool forms back up.  If your symptoms do not get better within 1 week or if they get worse, check with your doctor.  Dulcolax (bisacodyl) - Pick up over-the-counter and take as directed by the product packaging as needed to assist with the movement of your bowels.  Take with a full glass of water.  Use this product as needed if not relieved by  Colace only.   MiraLax (polyethylene glycol) - Pick up over-the-counter to have on hand.  MiraLax is a solution that will increase the amount of water in your bowels to assist with bowel movements.  Take as directed and can mix with a glass of water, juice, soda, coffee, or tea.  Take if you go more than two days without a movement. Do not use MiraLax more than once per day. Call your doctor if you are still constipated or irregular after using this medication for 7 days in a row.  If you continue to have problems with postoperative constipation, please contact the office for further assistance and recommendations.  If you experience "the worst abdominal pain ever" or develop nausea or vomiting, please contact the office immediatly for further recommendations for treatment.  ITCHING  If you experience itching with your medications, try taking only a single pain pill, or  even half a pain pill at a time.  You can also use Benadryl over the counter for itching or also to help with sleep.   TED HOSE STOCKINGS Wear the elastic stockings on both legs for two weeks following surgery during the day but you may remove then at night for sleeping.  Okay to remove ACE in 3 days, put TED on after  MEDICATIONS See your medication summary on the "After Visit Summary" that the nursing staff will review with you prior to discharge.  You may have some home medications which will be placed on hold until you complete the course of blood thinner medication.  It is important for you to complete the blood thinner medication as prescribed by your surgeon.  Continue your approved medications as instructed at time of discharge. If you were not previously taking any blood thinners prior to surgery please start taking Aspirin 325 mg tabs twice daily for 6 weeks. If you are unable to take Aspirin please let your doctor know.   PRECAUTIONS If you experience chest pain or shortness of breath - call 911 immediately for transfer to the hospital emergency department.  If you develop a fever greater that 101 F, purulent drainage from wound, increased redness or drainage from wound, foul odor from the wound/dressing, or calf pain - CONTACT YOUR SURGEON.                                                   FOLLOW-UP APPOINTMENTS Make sure you keep all of your appointments after your operation with your surgeon and caregivers. You should call the office at the above phone number and make an appointment for approximately two weeks after the date of your surgery or on the date instructed by your surgeon outlined in the "After Visit Summary".   RANGE OF MOTION AND STRENGTHENING EXERCISES  Rehabilitation of the knee is important following a knee injury or an operation. After just a few days of immobilization, the muscles of the thigh which control the knee become weakened and shrink (atrophy). Knee  exercises are designed to build up the tone and strength of the thigh muscles and to improve knee motion. Often times heat used for twenty to thirty minutes before working out will loosen up your tissues and help with improving the range of motion but do not use heat for the first two weeks following surgery. These exercises can be done on a training (exercise)  mat, on the floor, on a table or on a bed. Use what ever works the best and is most comfortable for you Knee exercises include:  Leg Lifts - While your knee is still immobilized in a splint or cast, you can do straight leg raises. Lift the leg to 60 degrees, hold for 3 sec, and slowly lower the leg. Repeat 10-20 times 2-3 times daily. Perform this exercise against resistance later as your knee gets better.  Quad and Hamstring Sets - Tighten up the muscle on the front of the thigh (Quad) and hold for 5-10 sec. Repeat this 10-20 times hourly. Hamstring sets are done by pushing the foot backward against an object and holding for 5-10 sec. Repeat as with quad sets.  Leg Slides: Lying on your back, slowly slide your foot toward your buttocks, bending your knee up off the floor (only go as far as is comfortable). Then slowly slide your foot back down until your leg is flat on the floor again. Angel Wings: Lying on your back spread your legs to the side as far apart as you can without causing discomfort.  A rehabilitation program following serious knee injuries can speed recovery and prevent re-injury in the future due to weakened muscles. Contact your doctor or a physical therapist for more information on knee rehabilitation.   IF YOU ARE TRANSFERRED TO A SKILLED REHAB FACILITY If the patient is transferred to a skilled rehab facility following release from the hospital, a list of the current medications will be sent to the facility for the patient to continue.  When discharged from the skilled rehab facility, please have the facility set up the patient's  Home Health Physical Therapy prior to being released. Also, the skilled facility will be responsible for providing the patient with their medications at time of release from the facility to include their pain medication, the muscle relaxants, and their blood thinner medication. If the patient is still at the rehab facility at time of the two week follow up appointment, the skilled rehab facility will also need to assist the patient in arranging follow up appointment in our office and any transportation needs.  MAKE SURE YOU:  Understand these instructions.  Get help right away if you are not doing well or get worse.    Pick up stool softner and laxative for home use following surgery while on pain medications. May shower starting three days after surgery. Please use a clean towel to pat the leg dry following showers. Continue to use ice for pain and swelling after surgery. Do not use any lotions or creams on the incision until instructed by you Start Aspirin immediately following surgery.   Do not put a pillow under the knee. Place it under the heel.   Complete by: As directed    Driving restrictions   Complete by: As directed    No driving for two weeks   TED hose   Complete by: As directed    Use stockings (TED hose) for three weeks on both leg(s).  You may remove them at night for sleeping.   Weight bearing as tolerated   Complete by: As directed      Allergies as of 10/29/2020   No Known Allergies     Medication List    TAKE these medications   Biotin 1000 MCG tablet Take 1,000 mcg by mouth daily.   cholecalciferol 25 MCG (1000 UNIT) tablet Commonly known as: VITAMIN D3 Take 1,000 Units by mouth daily.  [employer] CALCIUM 600 & VITAMIN D3 PO Take 1 tablet by mouth in the morning and at bedtime.   Dilt-XR 180 MG 24 hr capsule Generic drug: diltiazem Take 180 mg by mouth daily.   Fish Oil Concentrate 300 MG Caps Take 3 capsules by mouth daily.   fluticasone 50 MCG/ACT  nasal spray Commonly known as: FLONASE Place 2 sprays into both nostrils daily as needed for allergies.   folic acid 1 MG tablet Commonly known as: FOLVITE Take 3 mg by mouth daily.   hydrochlorothiazide 25 MG tablet Commonly known as: HYDRODIURIL Take 25 mg by mouth daily.   L-Lysine 500 MG Caps Take 2 capsules by mouth daily.   methocarbamol 500 MG tablet Commonly known as: Robaxin Take 1 tablet (500 mg total) by mouth 4 (four) times daily.   methotrexate 2.5 MG tablet Take 8 tablets (20 mg total) by mouth once a week. Caution: Chemotherapy. Protect from light. What changed: how much to take   MILK THISTLE EXTRACT PO Take 250 mg by mouth 3 (three) times daily.   ondansetron 4 MG tablet Commonly known as: Zofran Take 1 tablet (4 mg total) by mouth daily as needed for nausea or vomiting.   oxyCODONE 5 MG immediate release tablet Commonly known as: Roxicodone Take 1 tablet (5 mg total) by mouth every 4 (four) hours as needed for up to 7 days.   Potassium 99 MG Tabs Take 99 mg by mouth daily.   predniSONE 5 MG tablet Commonly known as: DELTASONE Take 5 mg by mouth daily as needed (flare ups).   Red Yeast Rice Extract 600 MG Caps Take 3 each by mouth daily.   Turmeric Curcumin 500 MG Caps Take 500 mg by mouth 2 (two) times daily.   vitamin C 1000 MG tablet Take 1,000 mg by mouth daily.            Discharge Care Instructions  (From admission, onward)         Start     Ordered   10/28/20 0000  Weight bearing as tolerated        10/28/20 1125           Signed: Drue Novel, PA-C [facility] 10/29/2020, 8:12 AM

## 2020-10-29 NOTE — Progress Notes (Signed)
Subjective: 1 Day Post-Op Procedure(s) (LRB): TOTAL KNEE ARTHROPLASTY (Right) Patient reports pain as 2 on 0-10 scale.  Seems comfortable ready and wants to go home. No CP nor SOB.  Objective: Vital signs in last 24 hours: Temp:  [97.4 F (36.3 C)-98.7 F (37.1 C)] 97.7 F (36.5 C) (12/23 1010) Pulse Rate:  [54-70] 64 (12/23 1010) Resp:  [12-18] 17 (12/23 1010) BP: (110-143)/(51-66) 119/59 (12/23 1010) SpO2:  [94 %-100 %] 100 % (12/23 1010)  Intake/Output from previous day: 12/22 0701 - 12/23 0700 In: 3944.4 [P.O.:990; I.V.:2629.4; IV Piggyback:325] Out: 970 [Urine:950; Blood:20] Intake/Output this shift: Total I/O In: 480 [P.O.:480] Out: -   Recent Labs    10/28/20 1214  HGB 13.6   Recent Labs    10/28/20 1214  WBC 6.5  RBC 4.38  HCT 40.3  PLT 270   Recent Labs    10/28/20 0610 10/28/20 1214  NA 136  --   K 2.8*  --   CL 98  --   CO2 27  --   BUN 12  --   CREATININE 0.65 0.52  GLUCOSE 147*  --   CALCIUM 9.3  --    Recent Labs    10/28/20 0610  INR 1.0    Neurologically intact ABD soft Sensation intact distally Intact pulses distally Dorsiflexion/Plantar flexion intact Incision: dressing C/D/I Compartment soft   Assessment/Plan: 1 Day Post-Op Procedure(s) (LRB): TOTAL KNEE ARTHROPLASTY (Right) Doing very well. Moves leg freely.No nausae pain well controlled . Plan DC home today after PT   All questions encouraged and answered.      Shelby Mcguire Shelby Mcguire 10/29/2020, 10:25 AM

## 2020-10-29 NOTE — Care Plan (Signed)
Ortho Bundle Case Management Note  Patient Details  Name: Shelby Mcguire MRN: [id number] Date of Birth: [birth year]  R TKA on 10-28-20 DCP:  Home with dtr.  3 story home with 1 ste. DME:  Has a standard walker.  MD aware and approves.  Doesn't want a 3-in-1. PT:  Nicole Kindred PT.  Eval scheduled on 11-03-20.                   DME Arranged:  N/A DME Agency:  NA  HH Arranged:  NA HH Agency:  NA  Additional Comments: Please contact me with any questions of if this plan should need to change.  Marianne Sofia, RN,CCM [facility]  [phone] 10/29/2020, 8:54 AM

## 2020-10-29 NOTE — Progress Notes (Signed)
Physical Therapy Treatment Patient Details Name: Shelby Mcguire MRN: [id number] DOB: Apr 21, 1946 Today's Date: 10/29/2020    History of Present Illness Patient is 74 y.o. female s/p Rt TKA on 10/28/20 with PMH significant for HTN, PVD, OA.    PT Comments    Patient seen for follow up therapy session and progressed to supervision level with RW for gait and transfers. No cues needed for safety with mobility. Reviewed remaining exercises in HEP for ROM and strengthening and precautions to maintain knee extension at rest. Patient is mobilizing at safe level for discharge home with assist from family, acute PT will continue to progress during acute stay.   Follow Up Recommendations  Follow surgeons recommendation for DC plan and follow-up therapies;Outpatient PT     Equipment Recommendations  None recommended by PT    Recommendations for Other Services       Precautions / Restrictions Precautions Precautions: Fall Restrictions Weight Bearing Restrictions: No Other Position/Activity Restrictions: WBAT    Mobility  Bed Mobility               General bed mobility comments: pt OOB in recliner  Transfers Overall transfer level: Needs assistance Equipment used: Rolling walker (2 wheeled) Transfers: Sit to/from Stand Sit to Stand: Supervision         General transfer comment: pt with good carryover for safe hand placement on RW, no assist needed for rise from recliner.  Ambulation/Gait Ambulation/Gait assistance: Min Gaffer (Feet): 240 Feet Assistive device: Rolling walker (2 wheeled) Gait Pattern/deviations: Decreased stride length;Step-through pattern Gait velocity: decr   General Gait Details: pt ambulating with step through pattern, no overt LOB or buckling at Rt knee, pt maintained safe position to walker, supervision for safety.   Stairs Stairs: Yes Stairs assistance: Min guard Stair Management: No rails;Step to  pattern;Forwards;With walker Number of Stairs: 1 General stair comments: cues for safe step sequencing "up with good, down with bad" and position of RW for curb/step negotiation.   Wheelchair Mobility    Modified Rankin (Stroke Patients Only)       Balance Overall balance assessment: Needs assistance Sitting-balance support: Feet supported Sitting balance-Leahy Scale: Good     Standing balance support: During functional activity;Bilateral upper extremity supported Standing balance-Leahy Scale: Fair                              Cognition Arousal/Alertness: Awake/alert Behavior During Therapy: WFL for tasks assessed/performed Overall Cognitive Status: Within Functional Limits for tasks assessed                                        Exercises Total Joint Exercises Ankle Circles/Pumps: AROM;Both;20 reps;Seated Quad Sets: AROM;Right;Seated;10 reps Short Arc Quad: AROM;Right;Seated;5 reps Heel Slides: AROM;Right;Seated;5 reps Hip ABduction/ADduction: AROM;Right;Seated;5 reps Straight Leg Raises: AROM;Right;Seated;5 reps Long Arc Quad: AROM;Right;Seated;5 reps Knee Flexion: AROM;Right;Seated;5 reps    General Comments        Pertinent Vitals/Pain Pain Assessment: 0-10 Pain Score: 3  Pain Location: Rt knee Pain Descriptors / Indicators:  (stretching) Pain Intervention(s): Limited activity within patient's tolerance;Premedicated before session;Monitored during session;Repositioned    Home Living                      Prior Function            PT Goals (current  goals can now be found in the care plan section) Acute Rehab PT Goals Patient Stated Goal: be able to sew again on sewing machine PT Goal Formulation: With patient Time For Goal Achievement: 11/04/20 Potential to Achieve Goals: Good Progress towards PT goals: Progressing toward goals    Frequency    7X/week      PT Plan Current plan remains appropriate     Co-evaluation              AM-PAC PT "6 Clicks" Mobility   Outcome Measure  Help needed turning from your back to your side while in a flat bed without using bedrails?: None Help needed moving from lying on your back to sitting on the side of a flat bed without using bedrails?: A Little Help needed moving to and from a bed to a chair (including a wheelchair)?: A Little Help needed standing up from a chair using your arms (e.g., wheelchair or bedside chair)?: A Little Help needed to walk in hospital room?: A Little Help needed climbing 3-5 steps with a railing? : A Little 6 Click Score: 19    End of Session Equipment Utilized During Treatment: Gait belt Activity Tolerance: Patient tolerated treatment well Patient left: in chair;with call bell/phone within reach;with chair alarm set Nurse Communication: Mobility status PT Visit Diagnosis: Muscle weakness (generalized) (M62.81);Difficulty in walking, not elsewhere classified (R26.2)     Time: 1206-1229 PT Time Calculation (min) (ACUTE ONLY): 23 min  Charges:  $Gait Training: 8-22 mins $Therapeutic Exercise: 8-22 mins                     Verner Mould, DPT Acute Rehabilitation Services Office [phone] Pager [phone]     Jacques Navy 10/29/2020, 1:01 PM

## 2020-11-06 ENCOUNTER — Other Ambulatory Visit: Payer: Self-pay

## 2020-11-06 ENCOUNTER — Ambulatory Visit
Admission: RE | Admit: 2020-11-06 | Discharge: 2020-11-06 | Disposition: A | Discharge status: Home / Self Care | Payer: Medicare Other | Source: Ambulatory Visit | Attending: Emergency Medicine | Admitting: Emergency Medicine

## 2020-11-06 VITALS — BP 144/65 | HR 70 | Temp 98.0°F | Resp 20

## 2020-11-06 DIAGNOSIS — J069 Acute upper respiratory infection, unspecified: Secondary | ICD-10-CM | POA: Diagnosis present

## 2020-11-06 LAB — POCT RAPID STREP A (OFFICE): Rapid Strep A Screen: NEGATIVE

## 2020-11-06 MED ORDER — BENZONATATE 100 MG PO CAPS: 100.0000 mg | ORAL_CAPSULE | Freq: Three times a day (TID) | ORAL | 0 refills | Status: DC

## 2020-11-06 NOTE — ED Triage Notes (Addendum)
Pt present sore throat and headache. Symptoms started a week ago when she was release from hospital.  Pt state that she was tested for covid, rsv and flu yesterday and it was negative.

## 2020-11-06 NOTE — Discharge Instructions (Signed)
Your rapid strep test was negative today.  The culture is pending.  Please look on your MyChart for test results.   We will notify you if the culture positive and outline a treatment plan at that time.   Please continue Tylenol and/or Ibuprofen as needed for fever, pain.  May try warm salt water gargles, cepacol lozenges, throat spray, warm tea or water with lemon/honey, or OTC cold relief medicine for throat discomfort.  May gargle, spit viscous lidocaine as prescribed for additional relief.  (Please note this may cause the back of your tongue and mouth to be numb as well)  For congestion: take a daily anti-histamine like Zyrtec, Claritin, and a oral decongestant to help with post nasal drip that may be irritating your throat.   It is important to stay hydrated: drink plenty of fluids (primarily water) to keep your throat moisturized and help further relieve irritation/discomfort.  

## 2020-11-06 NOTE — ED Provider Notes (Signed)
[facility]    CSN: [id number] Arrival date & time: 11/06/20  1102      History   Chief Complaint Chief Complaint  Patient presents with  . Sore Throat  . Headache    HPI Shelby Mcguire is a 74 y.o. female  With history as below presenting for sore throat, frontal headache, nasal congestion and dry cough.  States this began about a week ago.  Underwent Covid, RSV, flu yesterday: Negative.  Denying chest pain, shortness of breath.  Past Medical History:  Diagnosis Date  . Arthritis   . Eczema   . Family history of breast cancer   . Family history of colon cancer   . Family history of ovarian cancer   . Hypertension   . Peripheral vascular disease ([place])    bi lat    Patient Active Problem List   Diagnosis Date Noted  . Osteoarthritis of right knee 10/28/2020  . Breast cancer screening, high risk patient 08/15/2017  . Varicose veins of bilateral lower extremities with other complications 0000000  . Immunocompromised ([place]) 01/04/2017  . Allergic rhinitis due to allergen 01/04/2017  . Family history of breast cancer   . Family history of ovarian cancer   . Family history of colon cancer   . Genetic testing 02/12/2015  . Rheumatoid arthritis ([place]) 09/05/2012  . Hyperlipidemia 09/05/2012  . Atypical ductal hyperplasia of right breast 09/05/2012    Past Surgical History:  Procedure Laterality Date  . ABDOMINAL HYSTERECTOMY    . BREAST EXCISIONAL BIOPSY Left    benign  . BREAST SURGERY Bilateral   . CESAREAN SECTION    . ENDOVENOUS ABLATION SAPHENOUS VEIN W/ LASER Left 10/03/2017   endovenous laser ablation L GSV   . ENDOVENOUS ABLATION SAPHENOUS VEIN W/ LASER Right 10/24/2017   endovenous laser ablation right greater saphenous vein by Tinnie Gens MD   . [place] Right    ADH 1999  . THYROIDECTOMY, PARTIAL  1982  . TONSILLECTOMY    . TOTAL KNEE ARTHROPLASTY Right 10/28/2020   Procedure: TOTAL KNEE ARTHROPLASTY;  Surgeon:  Sydnee Cabal, MD;  Location: [facility];  Service: Orthopedics;  Laterality: Right;  with adductor canal    OB History   No obstetric history on file.      Home Medications    Prior to Admission medications   Medication Sig Start Date End Date Taking? Authorizing Provider  benzonatate (TESSALON) 100 MG capsule Take 1 capsule (100 mg total) by mouth every 8 (eight) hours. 11/06/20  Yes Hall-Potvin, [place], PA-C  Ascorbic Acid (VITAMIN C) 1000 MG tablet Take 1,000 mg by mouth daily.    [provider]  Biotin 1000 MCG tablet Take 1,000 mcg by mouth daily.    [provider]  Calcium Carb-Cholecalciferol ([employer] CALCIUM 600 & VITAMIN D3 PO) Take 1 tablet by mouth in the morning and at bedtime.    [provider]  cholecalciferol (VITAMIN D3) 25 MCG (1000 UNIT) tablet Take 1,000 Units by mouth daily.    [provider]  DILT-XR 180 MG 24 hr capsule Take 180 mg by mouth daily. 09/16/20   [provider]  fluticasone (FLONASE) 50 MCG/ACT nasal spray Place 2 sprays into both nostrils daily as needed for allergies. 04/05/16   [provider]  folic acid (FOLVITE) 1 MG tablet Take 3 mg by mouth daily.    [provider]  hydrochlorothiazide (HYDRODIURIL) 25 MG tablet Take 25 mg by mouth daily. 09/20/20  [provider]  L-Lysine 500 MG CAPS Take 2 capsules by mouth daily.    [provider]  methocarbamol (ROBAXIN) 500 MG tablet Take 1 tablet (500 mg total) by mouth 4 (four) times daily. 10/28/20   Cherie Dark, PA  methotrexate 2.5 MG tablet Take 8 tablets (20 mg total) by mouth once a week. Caution: Chemotherapy. Protect from light. Patient taking differently: Take 17.5 mg by mouth once a week. Caution: Chemotherapy. Protect from light. 08/16/16   Serena Croissant, MD  MILK THISTLE EXTRACT PO Take 250 mg by mouth 3 (three) times daily.     [provider]  Omega-3 Fatty Acids (FISH OIL CONCENTRATE) 300 MG CAPS  Take 3 capsules by mouth daily.    [provider]  ondansetron (ZOFRAN) 4 MG tablet Take 1 tablet (4 mg total) by mouth daily as needed for nausea or vomiting. 10/28/20 10/28/21  Cherie Dark, PA  Potassium 99 MG TABS Take 99 mg by mouth daily.    [provider]  predniSONE (DELTASONE) 5 MG tablet Take 5 mg by mouth daily as needed (flare ups). 07/31/16   [provider]  Red Yeast Rice Extract 600 MG CAPS Take 3 each by mouth daily.     [provider]  Turmeric Curcumin 500 MG CAPS Take 500 mg by mouth 2 (two) times daily.     [provider]    Family History Family History  Problem Relation Age of Onset  . Diabetes Mother   . Prostate cancer Father        dx late 15s  . Breast cancer Sister 45  . Asthma Sister   . Colon cancer Maternal Aunt        dx in her late 53s  . Cancer Paternal Uncle   . Ovarian cancer Sister 21  . Brain cancer Sister 60  . Alcohol abuse Son   . Lung cancer Maternal Aunt   . Allergic rhinitis Neg Hx   . Angioedema Neg Hx   . Eczema Neg Hx   . Immunodeficiency Neg Hx   . Urticaria Neg Hx     Social History Social History   Tobacco Use  . Smoking status: Former Smoker    Packs/day: 1.00    Years: 15.00    Pack years: 15.00    Quit date: 03/01/1980    Years since quitting: 40.7  . Smokeless tobacco: Never Used  Vaping Use  . Vaping Use: Never used  Substance Use Topics  . Alcohol use: No  . Drug use: No     Allergies   Patient has no known allergies.   Review of Systems Review of Systems  Constitutional: Negative for fatigue and fever.  HENT: Positive for congestion and sore throat. Negative for dental problem, ear pain, facial swelling, hearing loss, sinus pain, trouble swallowing and voice change.   Eyes: Negative for photophobia, pain and visual disturbance.  Respiratory: Positive for cough. Negative for shortness of breath.   Cardiovascular: Negative for chest pain and palpitations.   Gastrointestinal: Negative for diarrhea and vomiting.  Musculoskeletal: Negative for arthralgias and myalgias.  Neurological: Positive for headaches. Negative for dizziness.     Physical Exam Triage Vital Signs ED Triage Vitals  Enc Vitals Group     BP 11/06/20 1139 (!) 144/65     Pulse Rate 11/06/20 1139 70     Resp 11/06/20 1139 20     Temp 11/06/20 1139 98 F (36.7 C)  Temp Source 11/06/20 1139 Oral     SpO2 11/06/20 1139 95 %     Weight --      Height --      Head Circumference --      Peak Flow --      Pain Score 11/06/20 1144 9     Pain Loc --      Pain Edu? --      Excl. in [place]? --    No data found.  Updated Vital Signs BP (!) 144/65 (BP Location: Left Arm)   Pulse 70   Temp 98 F (36.7 C) (Oral)   Resp 20   SpO2 95%   Visual Acuity Right Eye Distance:   Left Eye Distance:   Bilateral Distance:    Right Eye Near:   Left Eye Near:    Bilateral Near:     Physical Exam Constitutional:      General: She is not in acute distress.    Appearance: She is not ill-appearing or diaphoretic.  HENT:     Head: Normocephalic and atraumatic.     Right Ear: Tympanic membrane and ear canal normal.     Left Ear: Tympanic membrane and ear canal normal.     Mouth/Throat:     Mouth: Mucous membranes are moist.     Pharynx: Oropharynx is clear. Uvula midline. No oropharyngeal exudate, posterior oropharyngeal erythema or uvula swelling.     Tonsils: No tonsillar exudate.  Eyes:     General: No scleral icterus.    Conjunctiva/sclera: Conjunctivae normal.     Pupils: Pupils are equal, round, and reactive to light.  Neck:     Comments: Trachea midline, negative JVD Cardiovascular:     Rate and Rhythm: Normal rate and regular rhythm.     Heart sounds: No murmur heard. No gallop.   Pulmonary:     Effort: Pulmonary effort is normal. No respiratory distress.     Breath sounds: No wheezing, rhonchi or rales.  Musculoskeletal:     Cervical back: Neck supple. No  tenderness.  Lymphadenopathy:     Cervical: No cervical adenopathy.  Skin:    Capillary Refill: Capillary refill takes less than 2 seconds.     Coloration: Skin is not jaundiced or pale.     Findings: No rash.  Neurological:     General: No focal deficit present.     Mental Status: She is alert and oriented to person, place, and time.      UC Treatments / Results  Labs (all labs ordered are listed, but only abnormal results are displayed) Labs Reviewed  POCT RAPID STREP A (OFFICE)    EKG   Radiology No results found.  Procedures Procedures (including critical care time)  Medications Ordered in UC Medications - No data to display  Initial Impression / Assessment and Plan / UC Course  I have reviewed the triage vital signs and the nursing notes.  Pertinent labs & imaging results that were available during my care of the patient were reviewed by me and considered in my medical decision making (see chart for details).     Patient afebrile, nontoxic, with SpO2 95%.  Rapid strep negative, culture pending.  We will treat supportively as outlined below.  Return precautions discussed, patient verbalized understanding and is agreeable to plan. Final Clinical Impressions(s) / UC Diagnoses   Final diagnoses:  URI with cough and congestion     Discharge Instructions     Your rapid strep test was negative  today.  The culture is pending.  Please look on your MyChart for test results.   We will notify you if the culture positive and outline a treatment plan at that time.   Please continue Tylenol and/or Ibuprofen as needed for fever, pain.  May try warm salt water gargles, cepacol lozenges, throat spray, warm tea or water with lemon/honey, or OTC cold relief medicine for throat discomfort.  May gargle, spit viscous lidocaine as prescribed for additional relief.  (Please note this may cause the back of your tongue and mouth to be numb as well)  For congestion: take a daily  anti-histamine like Zyrtec, Claritin, and a oral decongestant to help with post nasal drip that may be irritating your throat.   It is important to stay hydrated: drink plenty of fluids (primarily water) to keep your throat moisturized and help further relieve irritation/discomfort.     ED Prescriptions    Medication Sig Dispense Auth. Provider   benzonatate (TESSALON) 100 MG capsule Take 1 capsule (100 mg total) by mouth every 8 (eight) hours. 21 capsule Hall-Potvin, [place], PA-C     PDMP not reviewed this encounter.   Hall-Potvin, [place], [place] 11/06/20 1222

## 2020-11-11 LAB — CULTURE, GROUP A STREP (THRC)

## 2021-06-28 ENCOUNTER — Other Ambulatory Visit: Payer: Self-pay | Admitting: Hematology and Oncology

## 2021-06-28 DIAGNOSIS — Z1231 Encounter for screening mammogram for malignant neoplasm of breast: Secondary | ICD-10-CM

## 2021-09-20 ENCOUNTER — Ambulatory Visit
Admission: RE | Admit: 2021-09-20 | Discharge: 2021-09-20 | Disposition: A | Discharge status: Home / Self Care | Payer: Medicare Other | Source: Ambulatory Visit | Attending: Hematology and Oncology | Admitting: Hematology and Oncology

## 2021-09-20 DIAGNOSIS — Z1231 Encounter for screening mammogram for malignant neoplasm of breast: Secondary | ICD-10-CM

## 2022-01-31 ENCOUNTER — Other Ambulatory Visit: Payer: Self-pay | Admitting: Internal Medicine

## 2022-01-31 DIAGNOSIS — M85859 Other specified disorders of bone density and structure, unspecified thigh: Secondary | ICD-10-CM

## 2022-07-25 ENCOUNTER — Ambulatory Visit
Admission: RE | Admit: 2022-07-25 | Discharge: 2022-07-25 | Disposition: A | Discharge status: Home / Self Care | Payer: Medicare Other | Source: Ambulatory Visit | Attending: Internal Medicine | Admitting: Internal Medicine

## 2022-07-25 DIAGNOSIS — M85859 Other specified disorders of bone density and structure, unspecified thigh: Secondary | ICD-10-CM

## 2022-08-05 ENCOUNTER — Other Ambulatory Visit: Payer: Self-pay | Admitting: Hematology and Oncology

## 2022-08-05 DIAGNOSIS — Z1231 Encounter for screening mammogram for malignant neoplasm of breast: Secondary | ICD-10-CM

## 2022-09-21 ENCOUNTER — Ambulatory Visit
Admission: RE | Admit: 2022-09-21 | Discharge: 2022-09-21 | Disposition: A | Discharge status: Home / Self Care | Payer: Medicare Other | Source: Ambulatory Visit | Attending: Hematology and Oncology | Admitting: Hematology and Oncology

## 2022-09-21 DIAGNOSIS — Z1231 Encounter for screening mammogram for malignant neoplasm of breast: Secondary | ICD-10-CM

## 2022-09-25 ENCOUNTER — Emergency Department (HOSPITAL_COMMUNITY): Payer: Medicare Other

## 2022-09-25 ENCOUNTER — Emergency Department (HOSPITAL_COMMUNITY)
Admission: EM | Admit: 2022-09-25 | Discharge: 2022-09-25 | Disposition: A | Discharge status: Home / Self Care | Payer: Medicare Other | Attending: Emergency Medicine | Admitting: Emergency Medicine

## 2022-09-25 ENCOUNTER — Other Ambulatory Visit: Payer: Self-pay

## 2022-09-25 ENCOUNTER — Encounter (HOSPITAL_COMMUNITY): Payer: Self-pay

## 2022-09-25 DIAGNOSIS — Z79899 Other long term (current) drug therapy: Secondary | ICD-10-CM | POA: Diagnosis not present

## 2022-09-25 DIAGNOSIS — J0100 Acute maxillary sinusitis, unspecified: Secondary | ICD-10-CM

## 2022-09-25 DIAGNOSIS — R519 Headache, unspecified: Secondary | ICD-10-CM

## 2022-09-25 DIAGNOSIS — I10 Essential (primary) hypertension: Secondary | ICD-10-CM | POA: Diagnosis not present

## 2022-09-25 LAB — CBC WITH DIFFERENTIAL/PLATELET
Abs Immature Granulocytes: 0.03 10*3/uL (ref 0.00–0.07)
Basophils Absolute: 0.1 10*3/uL (ref 0.0–0.1)
Basophils Relative: 1 %
Eosinophils Absolute: 0.1 10*3/uL (ref 0.0–0.5)
Eosinophils Relative: 1 %
HCT: 39.9 % (ref 36.0–46.0)
Hemoglobin: 14.5 g/dL (ref 12.0–15.0)
Immature Granulocytes: 0 %
Lymphocytes Relative: 17 %
Lymphs Abs: 2 10*3/uL (ref 0.7–4.0)
MCH: 31.3 pg (ref 26.0–34.0)
MCHC: 36.3 g/dL — ABNORMAL HIGH (ref 30.0–36.0)
MCV: 86.2 fL (ref 80.0–100.0)
Monocytes Absolute: 0.7 10*3/uL (ref 0.1–1.0)
Monocytes Relative: 6 %
Neutro Abs: 8.8 10*3/uL — ABNORMAL HIGH (ref 1.7–7.7)
Neutrophils Relative %: 75 %
Platelets: 283 10*3/uL (ref 150–400)
RBC: 4.63 MIL/uL (ref 3.87–5.11)
RDW: 12.5 % (ref 11.5–15.5)
WBC: 11.6 10*3/uL — ABNORMAL HIGH (ref 4.0–10.5)
nRBC: 0 % (ref 0.0–0.2)

## 2022-09-25 LAB — COMPREHENSIVE METABOLIC PANEL
ALT: 22 U/L (ref 0–44)
AST: 26 U/L (ref 15–41)
Albumin: 4.1 g/dL (ref 3.5–5.0)
Alkaline Phosphatase: 72 U/L (ref 38–126)
Anion gap: 12 (ref 5–15)
BUN: 8 mg/dL (ref 8–23)
CO2: 27 mmol/L (ref 22–32)
Calcium: 9.4 mg/dL (ref 8.9–10.3)
Chloride: 95 mmol/L — ABNORMAL LOW (ref 98–111)
Creatinine, Ser: 0.61 mg/dL (ref 0.44–1.00)
GFR, Estimated: >60 mL/min (ref >60)
Glucose, Bld: 106 mg/dL — ABNORMAL HIGH (ref 70–99)
Potassium: 3.2 mmol/L — ABNORMAL LOW (ref 3.5–5.1)
Sodium: 134 mmol/L — ABNORMAL LOW (ref 135–145)
Total Bilirubin: 0.7 mg/dL (ref 0.3–1.2)
Total Protein: 6.7 g/dL (ref 6.5–8.1)

## 2022-09-25 MED ORDER — METRONIDAZOLE 500 MG PO TABS: 500.0000 mg | ORAL_TABLET | Freq: Two times a day (BID) | ORAL | 0 refills | Status: AC

## 2022-09-25 MED ORDER — LACTATED RINGERS IV BOLUS
1000.0000 mL | Freq: Once | INTRAVENOUS | Status: AC
  Administered 2022-09-25: 1000 mL via INTRAVENOUS

## 2022-09-25 MED ORDER — CEFDINIR 300 MG PO CAPS: 300.0000 mg | ORAL_CAPSULE | Freq: Two times a day (BID) | ORAL | 0 refills | Status: AC

## 2022-09-25 MED ORDER — PROCHLORPERAZINE EDISYLATE 10 MG/2ML IJ SOLN
10.0000 mg | Freq: Once | INTRAMUSCULAR | Status: AC
  Administered 2022-09-25: 10 mg via INTRAVENOUS
  Filled 2022-09-25: qty 2

## 2022-09-25 MED ORDER — POTASSIUM CHLORIDE CRYS ER 20 MEQ PO TBCR
40.0000 meq | EXTENDED_RELEASE_TABLET | Freq: Once | ORAL | Status: AC
  Administered 2022-09-25: 40 meq via ORAL
  Filled 2022-09-25: qty 2

## 2022-09-25 MED ORDER — IOHEXOL 350 MG/ML SOLN
75.0000 mL | Freq: Once | INTRAVENOUS | Status: AC | PRN
  Administered 2022-09-25: 75 mL via INTRAVENOUS

## 2022-09-25 MED ORDER — KETOROLAC TROMETHAMINE 15 MG/ML IJ SOLN
15.0000 mg | Freq: Once | INTRAMUSCULAR | Status: AC
  Administered 2022-09-25: 15 mg via INTRAVENOUS
  Filled 2022-09-25: qty 1

## 2022-09-25 NOTE — ED Triage Notes (Addendum)
Patient complains of having covid in October. Since the Covid ongoing headache and ear discomfort. Has seen her MD for same. Also concerned that her BP has been running high and taking BP meds as directed. Alert and oriented, no neuro deficits. Ambulatory with steady gait. Has also taken 2 antibiotics for ear infections

## 2022-09-25 NOTE — ED Provider Notes (Signed)
[facility] Provider Note   CSN: [id number] Arrival date & time: 09/25/22  1405     History  No chief complaint on file.   Shelby Mcguire is a 76 y.o. female with history of RA, HTN on methotrexate who presents to the ED for persistent headache since COVID infection.  The patient reports that she was diagnosed with COVID on October 14, now about 1 month ago.  Since then, she has had a dull pressure-like headache that has occurred every day and has not gone away.  She also reports postnasal drip and ear pain and has a muffled sensation in both of her ears though her hearing is intact.  She was seen by her PCP who prescribed 10 days of doxycycline, and then another 7 days of Bactrim for concerns for acute sinusitis.  Her symptoms have persisted however.  She also reports high blood pressures over the last few days, with systolics in the 329J up to 190s at home.  She takes hydrochlorothiazide 25 mg, as well as still checks daily, and her doctor told her today to take an extra dose but she wanted to come here for evaluation instead.  She denies any fevers, chest pain, shortness of breath, weakness, numbness, slurred speech, or difficulty walking.  The history is provided by the patient and medical records.        Home Medications Prior to Admission medications   Medication Sig Start Date End Date Taking? Authorizing Provider  cefdinir (OMNICEF) 300 MG capsule Take 1 capsule (300 mg total) by mouth 2 (two) times daily for 7 days. 09/25/22 10/02/22 Yes Renard Matter, MD  metroNIDAZOLE (FLAGYL) 500 MG tablet Take 1 tablet (500 mg total) by mouth 2 (two) times daily for 7 days. 09/25/22 10/02/22 Yes Renard Matter, MD  Ascorbic Acid (VITAMIN C) 1000 MG tablet Take 1,000 mg by mouth daily.    [provider]  benzonatate (TESSALON) 100 MG capsule Take 1 capsule (100 mg total) by mouth every 8 (eight) hours. 11/06/20   Hall-Potvin,  [place], PA-C  Biotin 1000 MCG tablet Take 1,000 mcg by mouth daily.    [provider]  Calcium Carb-Cholecalciferol ([employer] CALCIUM 600 & VITAMIN D3 PO) Take 1 tablet by mouth in the morning and at bedtime.    [provider]  cholecalciferol (VITAMIN D3) 25 MCG (1000 UNIT) tablet Take 1,000 Units by mouth daily.    [provider]  DILT-XR 180 MG 24 hr capsule Take 180 mg by mouth daily. 09/16/20   [provider]  fluticasone (FLONASE) 50 MCG/ACT nasal spray Place 2 sprays into both nostrils daily as needed for allergies. 04/05/16   [provider]  folic acid (FOLVITE) 1 MG tablet Take 3 mg by mouth daily.    [provider]  hydrochlorothiazide (HYDRODIURIL) 25 MG tablet Take 25 mg by mouth daily. 09/20/20   [provider]  L-Lysine 500 MG CAPS Take 2 capsules by mouth daily.    [provider]  methocarbamol (ROBAXIN) 500 MG tablet Take 1 tablet (500 mg total) by mouth 4 (four) times daily. 10/28/20   Drue Novel, PA  methotrexate 2.5 MG tablet Take 8 tablets (20 mg total) by mouth once a week. Caution: Chemotherapy. Protect from light. Patient taking differently: Take 17.5 mg by mouth once a week. Caution: Chemotherapy. Protect from light. 08/16/16   Nicholas Lose, MD  MILK THISTLE EXTRACT PO Take 250 mg by mouth 3 (three) times daily.  [provider]  Omega-3 Fatty Acids (FISH OIL CONCENTRATE) 300 MG CAPS Take 3 capsules by mouth daily.    [provider]  Potassium 99 MG TABS Take 99 mg by mouth daily.    [provider]  predniSONE (DELTASONE) 5 MG tablet Take 5 mg by mouth daily as needed (flare ups). 07/31/16   [provider]  Red Yeast Rice Extract 600 MG CAPS Take 3 each by mouth daily.     [provider]  Turmeric Curcumin 500 MG CAPS Take 500 mg by mouth 2 (two) times daily.     [provider]      Allergies    Patient has no known allergies.     Review of Systems   Review of Systems   See HPI.   Physical Exam Updated Vital Signs BP (!) 117/55   Pulse 64   Temp 98.2 F (36.8 C) (Oral)   Resp 16   SpO2 97%   Physical Exam Vitals and nursing note reviewed.  Constitutional:      Comments: Tearful.   HENT:     Head: Normocephalic and atraumatic.     Right Ear: Tympanic membrane, ear canal and external ear normal.     Left Ear: Tympanic membrane, ear canal and external ear normal.     Nose: Nose normal.     Mouth/Throat:     Pharynx: Oropharynx is clear. No oropharyngeal exudate or posterior oropharyngeal erythema.  Eyes:     Extraocular Movements: Extraocular movements intact.     Pupils: Pupils are equal, round, and reactive to light.     Comments: No proptosis or periorbital edema or erythema.  Cardiovascular:     Rate and Rhythm: Normal rate and regular rhythm.  Abdominal:     General: There is no distension.     Palpations: Abdomen is soft.     Tenderness: There is no abdominal tenderness.  Musculoskeletal:     Cervical back: Normal range of motion.  Skin:    General: Skin is warm and dry.  Neurological:     General: No focal deficit present.     Mental Status: She is alert.     Comments:  Mental Status Exam:  Orientation: Alert and oriented to person, place and time.  Memory: Cooperative, follows commands well. Recent and remote memory normal.  Attention, concentration: Attention span and concentration are normal.  Language: Speech is clear and language is normal.  Fund of knowledge: Vocabulary appropriate for patient age.   Cranial Nerves:   CN 2 (Optic): Visual fields intact to confrontation. CN 3,4,6 (EOM): Pupils equal and reactive to light. Full extraocular eye movement without nystagmus.  CN 5 (Trigeminal): Facial sensation is normal, no weakness of masticatory muscles.  CN 7 (Facial): No facial weakness or asymmetry.  CN 8 (Auditory): Auditory acuity grossly normal.  CN 9,10 (Glossophar): The  uvula is midline, the palate elevates symmetrically.  CN 11 (spinal access): Normal sternocleidomastoid and trapezius strength.  CN 12 (Hypoglossal): The tongue is midline. No atrophy or fasciculations.  Motor:  Strength: 5/5 and symmetric in the upper and lower extremities, no pronation or drift. Tone: Tone and muscle bulk are normal in the upper and lower extremities.   Coordination: Intact finger-to-nose, no tremor.   Sensation: Intact to light touch throughout.   Gait: Routine gait normal.   Psychiatric:     Comments: Anxious.      ED Results / Procedures / Treatments   Labs (all labs  ordered are listed, but only abnormal results are displayed) Labs Reviewed  CBC WITH DIFFERENTIAL/PLATELET - Abnormal; Notable for the following components:      Result Value   WBC 11.6 (*)    MCHC 36.3 (*)    Neutro Abs 8.8 (*)    All other components within normal limits  COMPREHENSIVE METABOLIC PANEL - Abnormal; Notable for the following components:   Sodium 134 (*)    Potassium 3.2 (*)    Chloride 95 (*)    Glucose, Bld 106 (*)    All other components within normal limits    EKG EKG Interpretation  Date/Time:  Sunday September 25 2022 14:22:17 EST Ventricular Rate:  71 PR Interval:  188 QRS Duration: 116 QT Interval:  399 QTC Calculation: 434 R Axis:   52 Text Interpretation: Sinus rhythm Incomplete left bundle branch block No acute changes No significant change since last tracing Confirmed by Varney Biles ([id number]) on 09/25/2022 3:11:42 PM  Radiology CT Maxillofacial W Contrast  Result Date: 09/25/2022 CLINICAL DATA:  Bacterial infection. Headache and ear pain following COVID diagnosis last month. EXAM: CT MAXILLOFACIAL WITH CONTRAST TECHNIQUE: Multidetector CT imaging of the maxillofacial structures was performed with intravenous contrast. Multiplanar CT image reconstructions were also generated. RADIATION DOSE REDUCTION: This exam was performed according to the departmental  dose-optimization program which includes automated exposure control, adjustment of the mA and/or kV according to patient size and/or use of iterative reconstruction technique. CONTRAST:  76m OMNIPAQUE IOHEXOL 350 MG/ML SOLN COMPARISON:  None Available. FINDINGS: Osseous: No acute or focal osseous abnormality is present. The mandible is intact and located. Extensive dental amalgam is present. No acute dental disease or periapical lucencies. No focal osseous lesions are present. Orbits: The globes and orbits are within normal limits. Sinuses: A fluid level is present the right maxillary sinus. Mucosal thickening is present the inferior maxillary sinuses bilaterally. Anterior ethmoid air cells are opacified bilaterally, right greater than left. Fluid is present in the inferior left frontal sinus. The right frontal sinus is not aerated. The sphenoid sinuses are clear. Mastoid air cells are clear. The external auditory canals and middle ear cavities are clear bilaterally. Soft tissues: Soft tissues the face are otherwise unremarkable. Limited intracranial: Within normal limits. IMPRESSION: 1. Diffuse anterior sinus disease. 2. Fluid level in the right maxillary sinus and inferior left frontal sinus compatible with acute sinusitis. 3. The temporal bones are clear bilaterally. No acute or focal abnormality to explain ear pain. Electronically Signed   By: CSan MorelleM.D.   On: 09/25/2022 19:10   CT VENOGRAM HEAD  Result Date: 09/25/2022 CLINICAL DATA:  The C EXAM: CT VENOGRAM HEAD TECHNIQUE: Venographic phase images of the brain were obtained following the administration of intravenous contrast. Multiplanar reformats and maximum intensity projections were generated. RADIATION DOSE REDUCTION: This exam was performed according to the departmental dose-optimization program which includes automated exposure control, adjustment of the mA and/or kV according to patient size and/or use of iterative reconstruction  technique. CONTRAST:  742mOMNIPAQUE IOHEXOL 350 MG/ML SOLN COMPARISON:  None Available. FINDINGS: No acute infarct, hemorrhage, or mass lesion is present. No significant white matter lesions are present. Deep brain nuclei are within normal limits. The ventricles are of normal size. No significant extraaxial fluid collection is present. The brainstem and cerebellum are within normal limits. The dural sinuses opacify normally. The right transverse sinus is dominant. No filling defects are present. No sinus thrombosis is present. Cortical veins are within normal limits. Atherosclerotic calcifications  are present within the cavernous internal carotid arteries bilaterally without significant stenosis. Arterial structures are within normal limits. IMPRESSION: 1. Normal CT appearance of the brain. 2. No evidence for dural sinus thrombosis. 3. Atherosclerotic calcifications within the cavernous internal carotid arteries bilaterally without significant stenosis. Electronically Signed   By: San Morelle M.D.   On: 09/25/2022 19:07   DG Chest Portable 1 View  Result Date: 09/25/2022 CLINICAL DATA:  Cough EXAM: PORTABLE CHEST 1 VIEW COMPARISON:  05/31/2016 FINDINGS: The heart size and mediastinal contours are within normal limits. No focal airspace consolidation, pleural effusion, or pneumothorax. The visualized skeletal structures are unremarkable. IMPRESSION: No active disease. Electronically Signed   By: Davina Poke D.O.   On: 09/25/2022 15:50    Procedures Procedures    Medications Ordered in ED Medications  lactated ringers bolus 1,000 mL (0 mLs Intravenous Stopped 09/25/22 1844)  prochlorperazine (COMPAZINE) injection 10 mg (10 mg Intravenous Given 09/25/22 1649)  ketorolac (TORADOL) 15 MG/ML injection 15 mg (15 mg Intravenous Given 09/25/22 1648)  potassium chloride SA (KLOR-CON M) CR tablet 40 mEq (40 mEq Oral Given 09/25/22 1752)  iohexol (OMNIPAQUE) 350 MG/ML injection 75 mL (75 mLs  Intravenous Contrast Given 09/25/22 1818)    ED Course/ Medical Decision Making/ A&P                           Medical Decision Making Problems Addressed: Acute maxillary sinusitis, recurrence not specified: complicated acute illness or injury Nonintractable headache, unspecified chronicity pattern, unspecified headache type: acute illness or injury that poses a threat to life or bodily functions  Amount and/or Complexity of Data Reviewed External Data Reviewed: labs and notes. Labs: ordered. Decision-making details documented in ED Course. Radiology: ordered and independent interpretation performed. Decision-making details documented in ED Course.  Risk Prescription drug management.   MDM MAEZIE JUSTIN is a 76 y.o. female with history of HTN, RA on methotrexate who presents with high blood pressure, persistent headache and cough since COVID infection 1 month ago as per above, found to have initial BP of 177/71.   Upon initial evaluation physical exam reveals nonfocal neuro exam, TMs clear bilaterally, no facial edema or oropharyngeal swelling.    DDX considered included (but not limited to): Hypertensive emergency, hypertensive urgency, asymptomatic hypertension, bacterial or fungal sinusitis, tension headache, pneumonia, among others.  Feel symptoms are most likely secondary to post-COVID syndrome, however given immunosuppression on methotrexate and prolonged course of symptoms in addition to hypertension associate with her headache, will obtain CTV head and CT face to further evaluate for bacterial infection or acute intracranial pathology. No fever, neck stiffness, or rash to suggest meningitis, and no AMS or neurologic deficits on exam. No blood thinner use or recent falls/trauma.   Baseline HTN per prior records appears to be ~sBP 140s.   Plan: Will obtain labs to evaluate for end-organ damage, CXR to evaluate for pulmonary edema or mediastinal widening, post-COVID cough. No  chest pain, pulse deficits, or neurologic deficits to suggest aortic dissection therefore do not feel that CTA is warranted at this time.    Testing Results: EKG on my independent interpretation demonstrates normal sinus rhythm, 71 bpm.  The PR, QRS, and QTc are appropriate.  No ST elevations or depressions, no hyperacute T waves or T wave inversions to suggest acute ischemia.  Incomplete left bundle branch block.  No significant change from prior.  Lab results: CBC shows mild leukocytosis, WBC 11.6.  Hemoglobin  within normal limits.  CMP overall unremarkable, mild hypokalemia to 3.2 which was repleted in ED.  Mild hyponatremia to 134.  Imaging results: Chest x-ray on my independent review as well as review by radiology demonstrates no concerning findings, including no focal consolidation to suggest pneumonia, no pneumothorax or pleural effusion, no widened mediastinum or enlarged cardiac silhouette. CTV without evidence of acute intracranial pathology including dural venous sinus thrombosis or stroke on my independent review or review by radiology. CT face shows diffuse anterior sinus disease with fluid level in the right maxillary sinus and inferior left frontal sinus compatible with acute sinusitis.  No acute ear disease.  With regards to the patient's hypertension, laboratory studies demonstrate no evidence of target organ damage (no elevation in creatinine to suggest AKI, no ischemic EKG changes to suggest type II MI, no altered mental status or neurologic deficits to suggest hypertensive encephalopathy, no hypoxia or shortness of breath to suggest pulmonary edema and chest x-ray does not demonstrate evidence of such).  Blood pressure improved while in the emergency department without blood pressure medications, improved with headache cocktail.  Prior to discharge systolic blood pressure now 117.  Interventions: Patient was given Toradol, Compazine, and potassium as well as IV fluids in the  emergency department.   ED Course: Patient re-evaluated.  Blood pressure has improved, and patient reports some symptomatic improvement in her headache.  She states that she feels ready for discharge at this time.  Work-up today notable for recurrent sinusitis.  Patient given prescription for Flagyl and cefdinir as she is unable to take Augmentin/amoxicillin with her methotrexate.  She was given strict return precautions, and also instructed to follow-up with the ENT as well as her PCP.  ENT clinic phone numbers provided.  Patient voiced understanding of and agreement with this plan, and was discharged in stable condition.  Final Clinical Impression: Acute sinusitis The plan for this patient was discussed with my attending physician, who voiced agreement and who oversaw evaluation and treatment of this patient.    Note: Estate manager/land agent was used in the creation of this note. Grammatical errors may be present.          Final Clinical Impression(s) / ED Diagnoses Final diagnoses:  Acute maxillary sinusitis, recurrence not specified  Nonintractable headache, unspecified chronicity pattern, unspecified headache type    Rx / DC Orders ED Discharge Orders          Ordered    cefdinir (OMNICEF) 300 MG capsule  2 times daily        09/25/22 2002    metroNIDAZOLE (FLAGYL) 500 MG tablet  2 times daily        09/25/22 2002              Lekia Nier, MD 09/25/22 2007    Varney Biles, MD 09/25/22 [phone]

## 2022-09-25 NOTE — Discharge Instructions (Addendum)
Your work-up today shows that you still have sinusitis.  We are sending you home with 2 new antibiotics to take.  Please take the Flagyl and cefdinir as prescribed twice per day for the next 7 days.  Please make an appointment to follow-up with your primary doctor to ensure that your symptoms are getting better and for repeat exam.  Please also follow-up with an ENT specialist as soon as possible.  We have provided phone numbers for 2 different clinics on this page, please schedule an appointment with whichever one is more convenient for you or which ever 1 can see you sooner.  Please return to the emergency department if you have severe worsening of your headache, neck stiffness, vision changes, weakness, high fevers, or any other reason to think you need emergency care.  We hope that you feel better soon.

## 2022-10-03 ENCOUNTER — Telehealth: Payer: Self-pay

## 2022-10-03 NOTE — Telephone Encounter (Signed)
     Patient  visit on 11/19  at [facility]  Have you been able to follow up with your primary care physician? Yes   The patient was or was not able to obtain any needed medicine or equipment. Yes   Are there diet recommendations that you are having difficulty following? Na   Patient expresses understanding of discharge instructions and education provided has no other needs at this time.  Yes      [place], [facility], Care Management  [phone] [address], [place], [place] [address] Phone: [phone] Email: [email or web address]

## 2022-11-22 DIAGNOSIS — H2513 Age-related nuclear cataract, bilateral: Secondary | ICD-10-CM | POA: Insufficient documentation

## 2022-12-01 NOTE — Patient Instructions (Signed)
DUE TO COVID-19 ONLY TWO VISITORS  (aged 78 and older)  ARE ALLOWED TO COME WITH YOU AND STAY IN THE WAITING ROOM ONLY DURING PRE OP AND PROCEDURE.   **NO VISITORS ARE ALLOWED IN THE SHORT STAY AREA OR RECOVERY ROOM!!**  IF YOU WILL BE ADMITTED INTO THE HOSPITAL YOU ARE ALLOWED ONLY FOUR SUPPORT PEOPLE DURING VISITATION HOURS ONLY (7 AM -8PM)   The support person(s) must pass our screening, gel in and out, and wear a mask at all times, including in the patient's room. Patients must also wear a mask when staff or their support person are in the room. Visitors GUEST BADGE MUST BE WORN VISIBLY  One adult visitor may remain with you overnight and MUST be in the room by 8 P.M.     Your procedure is scheduled on: 2/14.24   Report to [facility] Entrance    Report to admitting at   8:30 AM   Call this number if you have problems the morning of surgery [phone]   Do not eat food :After Midnight.   After Midnight you may have the following liquids until _8:00_____ AM/  DAY OF SURGERY  Water Black Coffee (sugar ok, NO MILK/CREAM OR CREAMERS)  Tea (sugar ok, NO MILK/CREAM OR CREAMERS) regular and decaf                             Plain Jell-O (NO RED)                                           Fruit ices (not with fruit pulp, NO RED)                                     Popsicles (NO RED)                                                                  Juice: apple, WHITE grape, WHITE cranberry Sports drinks like Gatorade (NO RED)                   The day of surgery:  Drink ONE (1) Pre-Surgery Clear Ensure  at  7:45 AM the morning of surgery. Drink in one sitting. Do not sip.  This drink was given to you during your hospital  pre-op appointment visit. Nothing else to drink after completing the  Pre-Surgery Clear Ensure at 8:00 AM          If you have questions, please contact your surgeon's office.      Oral Hygiene is also important to reduce your risk of  infection.                                    Remember - BRUSH YOUR TEETH THE MORNING OF SURGERY WITH YOUR REGULAR TOOTHPASTE  DENTURES WILL BE REMOVED PRIOR TO SURGERY PLEASE DO NOT APPLY "Poly grip" OR ADHESIVES!!!   Do NOT smoke after Midnight  Take these medicines the morning of surgery with A SIP OF WATER: Dilt-XR   Bring CPAP mask and tubing day of surgery.                              You may not have any metal on your body including hair pins, jewelry, and body piercing             Do not wear make-up, lotions, powders, perfumes/cologne, or deodorant  Do not wear nail polish including gel and S&S, artificial/acrylic nails, or any other type of covering on natural nails including finger and toenails. If you have artificial nails, gel coating, etc. that needs to be removed by a nail salon please have this removed prior to surgery or surgery may need to be canceled/ delayed if the surgeon/ anesthesia feels like they are unable to be safely monitored.   Do not shave  48 hours prior to surgery.     Do not bring valuables to the hospital. [place].   Contacts, glasses, or bridgework may not be worn into surgery.   DO NOT [place].     Patients discharged on the day of surgery will not be allowed to drive home.  Someone NEEDS to stay with you for the first 24 hours after anesthesia.   Special Instructions: Bring a copy of your healthcare power of attorney and living will documents the day of surgery if you haven't scanned them before.              Please read over the following fact sheets you were given: IF YOU HAVE QUESTIONS ABOUT YOUR PRE-OP INSTRUCTIONS PLEASE CALL [phone]    [facility] - Preparing for Surgery Before surgery, you can play an important role.  Because skin is not sterile, your skin needs to be as free of germs as possible.  You can reduce the number of germs on your  skin by washing with CHG (chlorahexidine gluconate) soap before surgery.  CHG is an antiseptic cleaner which kills germs and bonds with the skin to continue killing germs even after washing. Please DO NOT use if you have an allergy to CHG or antibacterial soaps.  If your skin becomes reddened/irritated stop using the CHG and inform your nurse when you arrive at Short Stay. Do not shave (including legs and underarms) for at least 48 hours prior to the first CHG shower.   Please follow these instructions carefully:  1.  Shower with CHG Soap the night before surgery and the  morning of Surgery.  2.  If you choose to wash your hair, wash your hair first as usual with your  normal  shampoo.  3.  After you shampoo, rinse your hair and body thoroughly to remove the  shampoo.                            4.  Use CHG as you would any other liquid soap.  You can apply chg directly  to the skin and wash                       Gently with a scrungie or clean washcloth.  5.  Apply the CHG Soap to your body ONLY FROM  THE NECK DOWN.   Do not use on face/ open                           Wound or open sores. Avoid contact with eyes, ears mouth and genitals (private parts).                       Wash face,  Genitals (private parts) with your normal soap.             6.  Wash thoroughly, paying special attention to the area where your surgery  will be performed.  7.  Thoroughly rinse your body with warm water from the neck down.  8.  DO NOT shower/wash with your normal soap after using and rinsing off  the CHG Soap.             9.  Pat yourself dry with a clean towel.            10.  Wear clean pajamas.            11.  Place clean sheets on your bed the night of your first shower and do not  sleep with pets. Day of Surgery : Do not apply any lotions/deodorants the morning of surgery.  Please wear clean clothes to the hospital/surgery center.  FAILURE TO FOLLOW THESE INSTRUCTIONS MAY RESULT IN THE CANCELLATION OF YOUR  SURGERY   ________________________________________________________________________  Incentive Spirometer  An incentive spirometer is a tool that can help keep your lungs clear and active. This tool measures how well you are filling your lungs with each breath. Taking long deep breaths may help reverse or decrease the chance of developing breathing (pulmonary) problems (especially infection) following: A long period of time when you are unable to move or be active. BEFORE THE PROCEDURE  If the spirometer includes an indicator to show your best effort, your nurse or respiratory therapist will set it to a desired goal. If possible, sit up straight or lean slightly forward. Try not to slouch. Hold the incentive spirometer in an upright position. INSTRUCTIONS FOR USE  Sit on the edge of your bed if possible, or sit up as far as you can in bed or on a chair. Hold the incentive spirometer in an upright position. Breathe out normally. Place the mouthpiece in your mouth and seal your lips tightly around it. Breathe in slowly and as deeply as possible, raising the piston or the ball toward the top of the column. Hold your breath for 3-5 seconds or for as long as possible. Allow the piston or ball to fall to the bottom of the column. Remove the mouthpiece from your mouth and breathe out normally. Rest for a few seconds and repeat Steps 1 through 7 at least 10 times every 1-2 hours when you are awake. Take your time and take a few normal breaths between deep breaths. The spirometer may include an indicator to show your best effort. Use the indicator as a goal to work toward during each repetition. After each set of 10 deep breaths, practice coughing to be sure your lungs are clear. If you have an incision (the cut made at the time of surgery), support your incision when coughing by placing a pillow or rolled up towels firmly against it. Once you are able to get out of bed, walk around indoors and cough  well. You may stop using the incentive spirometer when instructed  by your caregiver.  RISKS AND COMPLICATIONS Take your time so you do not get dizzy or light-headed. If you are in pain, you may need to take or ask for pain medication before doing incentive spirometry. It is harder to take a deep breath if you are having pain. AFTER USE Rest and breathe slowly and easily. It can be helpful to keep track of a log of your progress. Your caregiver can provide you with a simple table to help with this. If you are using the spirometer at home, follow these instructions: [place] IF:  You are having difficultly using the spirometer. You have trouble using the spirometer as often as instructed. Your pain medication is not giving enough relief while using the spirometer. You develop fever of 100.5 F (38.1 C) or higher. SEEK IMMEDIATE MEDICAL CARE IF:  You cough up bloody sputum that had not been present before. You develop fever of 102 F (38.9 C) or greater. You develop worsening pain at or near the incision site. MAKE SURE YOU:  Understand these instructions. Will watch your condition. Will get help right away if you are not doing well or get worse. Document Released: 03/06/2007 Document Revised: 01/16/2012 Document Reviewed: 05/07/2007 [facility] Patient Information 2014 [place], [place].   ________________________________________________________________________

## 2022-12-08 ENCOUNTER — Encounter (HOSPITAL_COMMUNITY): Payer: Self-pay

## 2022-12-08 ENCOUNTER — Other Ambulatory Visit: Payer: Self-pay

## 2022-12-08 ENCOUNTER — Encounter (HOSPITAL_COMMUNITY)
Admission: RE | Admit: 2022-12-08 | Discharge: 2022-12-08 | Disposition: A | Discharge status: Home / Self Care | Payer: Medicare Other | Source: Ambulatory Visit | Attending: Specialist | Admitting: Specialist

## 2022-12-08 DIAGNOSIS — Z01812 Encounter for preprocedural laboratory examination: Secondary | ICD-10-CM | POA: Diagnosis not present

## 2022-12-08 DIAGNOSIS — Z01818 Encounter for other preprocedural examination: Secondary | ICD-10-CM

## 2022-12-08 LAB — CBC
HCT: 41.5 % (ref 36.0–46.0)
Hemoglobin: 14.1 g/dL (ref 12.0–15.0)
MCH: 30.7 pg (ref 26.0–34.0)
MCHC: 34 g/dL (ref 30.0–36.0)
MCV: 90.4 fL (ref 80.0–100.0)
Platelets: 239 10*3/uL (ref 150–400)
RBC: 4.59 MIL/uL (ref 3.87–5.11)
RDW: 13 % (ref 11.5–15.5)
WBC: 6.9 10*3/uL (ref 4.0–10.5)
nRBC: 0 % (ref 0.0–0.2)

## 2022-12-08 LAB — COMPREHENSIVE METABOLIC PANEL
ALT: 22 U/L (ref 0–44)
AST: 24 U/L (ref 15–41)
Albumin: 4 g/dL (ref 3.5–5.0)
Alkaline Phosphatase: 55 U/L (ref 38–126)
Anion gap: 10 (ref 5–15)
BUN: 14 mg/dL (ref 8–23)
CO2: 28 mmol/L (ref 22–32)
Calcium: 9.3 mg/dL (ref 8.9–10.3)
Chloride: 96 mmol/L — ABNORMAL LOW (ref 98–111)
Creatinine, Ser: 0.59 mg/dL (ref 0.44–1.00)
GFR, Estimated: >60 mL/min (ref >60)
Glucose, Bld: 126 mg/dL — ABNORMAL HIGH (ref 70–99)
Potassium: 3.8 mmol/L (ref 3.5–5.1)
Sodium: 134 mmol/L — ABNORMAL LOW (ref 135–145)
Total Bilirubin: 0.6 mg/dL (ref 0.3–1.2)
Total Protein: 7 g/dL (ref 6.5–8.1)

## 2022-12-08 LAB — SURGICAL PCR SCREEN
MRSA, PCR: NEGATIVE
Staphylococcus aureus: NEGATIVE

## 2022-12-08 LAB — NO BLOOD PRODUCTS

## 2022-12-08 NOTE — Progress Notes (Signed)
Anesthesia note:  Bowel prep reminder:  NA  PCP - Dr. R. Varadarajan Cardiologist -none Other-   Chest x-ray - 09/25/22-epic EKG - 09/25/22-epic Stress Test - no ECHO - no Cardiac Cath - no CABG-no Pacemaker/ICD device last checked:NA  Sleep Study - no CPAP -   Pt is pre diabetic-no CBG at PAT visit- Fasting Blood Sugar at home- Checks Blood Sugar _____  Blood Thinner:no Blood Thinner Instructions: Aspirin Instructions: Last Dose:  Anesthesia review: /No    Patient denies shortness of breath, fever, cough and chest pain at PAT appointment. Pt has no SOB with any activities. She has RA and takes methotrexate she will call her Dr. to hold it.  She is nervous about the IV and surgery. No blood products.  Patient verbalized understanding of instructions that were given to them at the PAT appointment. Patient was also instructed that they will need to review over the PAT instructions again at home before surgery.yes 

## 2022-12-08 NOTE — H&P (View-Only) (Signed)
Anesthesia note:  Bowel prep reminder:  NA  PCP - Dr. Charmaine Downs Cardiologist -none Other-   Chest x-ray - 09/25/22-epic EKG - 09/25/22-epic Stress Test - no ECHO - no Cardiac Cath - no CABG-no Pacemaker/ICD device last checked:NA  Sleep Study - no CPAP -   Pt is pre diabetic-no CBG at PAT visit- Fasting Blood Sugar at home- Checks Blood Sugar _____  Blood Thinner:no Blood Thinner Instructions: Aspirin Instructions: Last Dose:  Anesthesia review: /No    Patient denies shortness of breath, fever, cough and chest pain at PAT appointment. Pt has no SOB with any activities. She has RA and takes methotrexate she will call her Dr. to hold it.  She is nervous about the IV and surgery. No blood products.  Patient verbalized understanding of instructions that were given to them at the PAT appointment. Patient was also instructed that they will need to review over the PAT instructions again at home before surgery.yes

## 2022-12-21 ENCOUNTER — Other Ambulatory Visit: Payer: Self-pay

## 2022-12-21 ENCOUNTER — Ambulatory Visit (HOSPITAL_COMMUNITY): Payer: Medicare Other | Admitting: Anesthesiology

## 2022-12-21 ENCOUNTER — Ambulatory Visit (HOSPITAL_COMMUNITY)
Admission: RE | Admit: 2022-12-21 | Discharge: 2022-12-21 | Disposition: A | Discharge status: Home / Self Care | Payer: Medicare Other | Source: Ambulatory Visit | Attending: Specialist | Admitting: Specialist

## 2022-12-21 ENCOUNTER — Encounter (HOSPITAL_COMMUNITY): Payer: Self-pay | Admitting: Specialist

## 2022-12-21 ENCOUNTER — Encounter (HOSPITAL_COMMUNITY)
Admission: RE | Disposition: A | Discharge status: Home / Self Care | Payer: Self-pay | Source: Ambulatory Visit | Attending: Specialist

## 2022-12-21 ENCOUNTER — Ambulatory Visit (HOSPITAL_BASED_OUTPATIENT_CLINIC_OR_DEPARTMENT_OTHER): Payer: Medicare Other | Admitting: Anesthesiology

## 2022-12-21 DIAGNOSIS — M1712 Unilateral primary osteoarthritis, left knee: Secondary | ICD-10-CM | POA: Insufficient documentation

## 2022-12-21 DIAGNOSIS — Z87891 Personal history of nicotine dependence: Secondary | ICD-10-CM | POA: Insufficient documentation

## 2022-12-21 DIAGNOSIS — D84821 Immunodeficiency due to drugs: Secondary | ICD-10-CM | POA: Insufficient documentation

## 2022-12-21 DIAGNOSIS — Z09 Encounter for follow-up examination after completed treatment for conditions other than malignant neoplasm: Secondary | ICD-10-CM | POA: Insufficient documentation

## 2022-12-21 DIAGNOSIS — E1151 Type 2 diabetes mellitus with diabetic peripheral angiopathy without gangrene: Secondary | ICD-10-CM | POA: Insufficient documentation

## 2022-12-21 DIAGNOSIS — Z79631 Long term (current) use of antimetabolite agent: Secondary | ICD-10-CM | POA: Insufficient documentation

## 2022-12-21 DIAGNOSIS — I10 Essential (primary) hypertension: Secondary | ICD-10-CM | POA: Insufficient documentation

## 2022-12-21 DIAGNOSIS — E871 Hypo-osmolality and hyponatremia: Secondary | ICD-10-CM | POA: Diagnosis not present

## 2022-12-21 DIAGNOSIS — M069 Rheumatoid arthritis, unspecified: Secondary | ICD-10-CM | POA: Insufficient documentation

## 2022-12-21 DIAGNOSIS — E222 Syndrome of inappropriate secretion of antidiuretic hormone: Secondary | ICD-10-CM | POA: Diagnosis not present

## 2022-12-21 DIAGNOSIS — Z01818 Encounter for other preprocedural examination: Secondary | ICD-10-CM

## 2022-12-21 HISTORY — PX: TOTAL KNEE ARTHROPLASTY: SHX125

## 2022-12-21 SURGERY — ARTHROPLASTY, KNEE, TOTAL
Anesthesia: Spinal | Site: Knee | Laterality: Left

## 2022-12-21 MED ORDER — BUPIVACAINE LIPOSOME 1.3 % IJ SUSP
INTRAMUSCULAR | Status: AC
  Filled 2022-12-21: qty 10

## 2022-12-21 MED ORDER — SODIUM CHLORIDE (PF) 0.9 % IJ SOLN
INTRAMUSCULAR | Status: AC
  Filled 2022-12-21: qty 50

## 2022-12-21 MED ORDER — METHOCARBAMOL 500 MG IVPB - SIMPLE MED
500.0000 mg | Freq: Four times a day (QID) | INTRAVENOUS | Status: DC | PRN
  Administered 2022-12-21: 500 mg via INTRAVENOUS

## 2022-12-21 MED ORDER — ACETAMINOPHEN 500 MG PO TABS: 1000.0000 mg | ORAL_TABLET | Freq: Four times a day (QID) | ORAL | Status: DC

## 2022-12-21 MED ORDER — METHOCARBAMOL 500 MG IVPB - SIMPLE MED
INTRAVENOUS | Status: AC
  Filled 2022-12-21: qty 55

## 2022-12-21 MED ORDER — 0.9 % SODIUM CHLORIDE (POUR BTL) OPTIME
TOPICAL | Status: DC | PRN
  Administered 2022-12-21: 1000 mL

## 2022-12-21 MED ORDER — POVIDONE-IODINE 10 % EX SWAB: 2.0000 | Freq: Once | CUTANEOUS | Status: DC

## 2022-12-21 MED ORDER — ASPIRIN 81 MG PO TBEC: 81.0000 mg | DELAYED_RELEASE_TABLET | Freq: Two times a day (BID) | ORAL | 0 refills | Status: DC

## 2022-12-21 MED ORDER — BUPIVACAINE LIPOSOME 1.3 % IJ SUSP
INTRAMUSCULAR | Status: DC | PRN
  Administered 2022-12-21: 10 mL via PERINEURAL

## 2022-12-21 MED ORDER — HYDROMORPHONE HCL 1 MG/ML IJ SOLN
INTRAMUSCULAR | Status: AC
  Filled 2022-12-21: qty 1

## 2022-12-21 MED ORDER — CEFAZOLIN SODIUM-DEXTROSE 2-4 GM/100ML-% IV SOLN
2.0000 g | INTRAVENOUS | Status: AC
  Administered 2022-12-21: 2 g via INTRAVENOUS
  Filled 2022-12-21: qty 100

## 2022-12-21 MED ORDER — HYDROMORPHONE HCL 1 MG/ML IJ SOLN
0.2500 mg | INTRAMUSCULAR | Status: DC | PRN
  Administered 2022-12-21 (×2): 0.5 mg via INTRAVENOUS
  Administered 2022-12-21: 0.25 mg via INTRAVENOUS

## 2022-12-21 MED ORDER — ACETAMINOPHEN 500 MG PO TABS
1000.0000 mg | ORAL_TABLET | Freq: Once | ORAL | Status: AC
  Administered 2022-12-21: 1000 mg via ORAL
  Filled 2022-12-21: qty 2

## 2022-12-21 MED ORDER — ORAL CARE MOUTH RINSE: 15.0000 mL | Freq: Once | OROMUCOSAL | Status: AC

## 2022-12-21 MED ORDER — CEPHALEXIN 500 MG PO CAPS: 500.0000 mg | ORAL_CAPSULE | Freq: Four times a day (QID) | ORAL | 0 refills | Status: DC

## 2022-12-21 MED ORDER — TRAMADOL HCL 50 MG PO TABS
ORAL_TABLET | ORAL | Status: AC
  Filled 2022-12-21: qty 1

## 2022-12-21 MED ORDER — AMISULPRIDE (ANTIEMETIC) 5 MG/2ML IV SOLN
10.0000 mg | Freq: Once | INTRAVENOUS | Status: AC | PRN
  Administered 2022-12-21: 10 mg via INTRAVENOUS

## 2022-12-21 MED ORDER — MIDAZOLAM HCL 2 MG/2ML IJ SOLN
1.0000 mg | Freq: Once | INTRAMUSCULAR | Status: AC
  Administered 2022-12-21: 2 mg via INTRAVENOUS
  Filled 2022-12-21: qty 2

## 2022-12-21 MED ORDER — ONDANSETRON HCL 4 MG PO TABS: 4.0000 mg | ORAL_TABLET | Freq: Every day | ORAL | 1 refills | Status: DC | PRN

## 2022-12-21 MED ORDER — PROPOFOL 500 MG/50ML IV EMUL
INTRAVENOUS | Status: DC | PRN
  Administered 2022-12-21: 75 ug/kg/min via INTRAVENOUS

## 2022-12-21 MED ORDER — PROPOFOL 1000 MG/100ML IV EMUL
INTRAVENOUS | Status: AC
  Filled 2022-12-21: qty 100

## 2022-12-21 MED ORDER — OXYCODONE HCL 5 MG PO TABS
ORAL_TABLET | ORAL | Status: AC
  Filled 2022-12-21: qty 1

## 2022-12-21 MED ORDER — TRANEXAMIC ACID-NACL 1000-0.7 MG/100ML-% IV SOLN
1000.0000 mg | INTRAVENOUS | Status: AC
  Administered 2022-12-21: 1000 mg via INTRAVENOUS
  Filled 2022-12-21: qty 100

## 2022-12-21 MED ORDER — FENTANYL CITRATE (PF) 100 MCG/2ML IJ SOLN
INTRAMUSCULAR | Status: AC
  Filled 2022-12-21: qty 2

## 2022-12-21 MED ORDER — BUPIVACAINE LIPOSOME 1.3 % IJ SUSP
INTRAMUSCULAR | Status: AC
  Filled 2022-12-21: qty 20

## 2022-12-21 MED ORDER — BUPIVACAINE LIPOSOME 1.3 % IJ SUSP: 20.0000 mL | Freq: Once | INTRAMUSCULAR | Status: DC

## 2022-12-21 MED ORDER — SODIUM CHLORIDE (PF) 0.9 % IJ SOLN
INTRAMUSCULAR | Status: DC | PRN
  Administered 2022-12-21: 10 mL
  Administered 2022-12-21: 50 mL

## 2022-12-21 MED ORDER — OXYCODONE HCL 5 MG PO TABS
5.0000 mg | ORAL_TABLET | ORAL | Status: DC | PRN
  Administered 2022-12-21: 10 mg via ORAL
  Administered 2022-12-21: 5 mg via ORAL

## 2022-12-21 MED ORDER — FENTANYL CITRATE (PF) 100 MCG/2ML IJ SOLN
INTRAMUSCULAR | Status: DC | PRN
  Administered 2022-12-21 (×3): 25 ug via INTRAVENOUS

## 2022-12-21 MED ORDER — BUPIVACAINE IN DEXTROSE 0.75-8.25 % IT SOLN
INTRATHECAL | Status: DC | PRN
  Administered 2022-12-21: 1.5 mL via INTRATHECAL

## 2022-12-21 MED ORDER — DEXAMETHASONE SODIUM PHOSPHATE 10 MG/ML IJ SOLN
INTRAMUSCULAR | Status: AC
  Filled 2022-12-21: qty 1

## 2022-12-21 MED ORDER — OXYCODONE HCL 5 MG PO TABS
ORAL_TABLET | ORAL | Status: AC
  Filled 2022-12-21: qty 2

## 2022-12-21 MED ORDER — PROPOFOL 10 MG/ML IV BOLUS
INTRAVENOUS | Status: DC | PRN
  Administered 2022-12-21: 50 mg via INTRAVENOUS

## 2022-12-21 MED ORDER — EPHEDRINE 5 MG/ML INJ
INTRAVENOUS | Status: AC
  Filled 2022-12-21: qty 10

## 2022-12-21 MED ORDER — BUPIVACAINE HCL (PF) 0.5 % IJ SOLN
INTRAMUSCULAR | Status: DC | PRN
  Administered 2022-12-21: 15 mL via PERINEURAL

## 2022-12-21 MED ORDER — LACTATED RINGERS IV BOLUS
250.0000 mL | Freq: Once | INTRAVENOUS | Status: AC
  Administered 2022-12-21: 250 mL via INTRAVENOUS

## 2022-12-21 MED ORDER — LIDOCAINE HCL (PF) 2 % IJ SOLN
INTRAMUSCULAR | Status: AC
  Filled 2022-12-21: qty 5

## 2022-12-21 MED ORDER — SODIUM CHLORIDE (PF) 0.9 % IJ SOLN
INTRAMUSCULAR | Status: AC
  Filled 2022-12-21: qty 10

## 2022-12-21 MED ORDER — METHOCARBAMOL 500 MG IVPB - SIMPLE MED
INTRAVENOUS | Status: AC
  Administered 2022-12-21: 500 mg
  Filled 2022-12-21: qty 55

## 2022-12-21 MED ORDER — FENTANYL CITRATE PF 50 MCG/ML IJ SOSY
50.0000 ug | PREFILLED_SYRINGE | Freq: Once | INTRAMUSCULAR | Status: AC
  Administered 2022-12-21: 50 ug via INTRAVENOUS
  Filled 2022-12-21: qty 2

## 2022-12-21 MED ORDER — AMISULPRIDE (ANTIEMETIC) 5 MG/2ML IV SOLN
INTRAVENOUS | Status: AC
  Filled 2022-12-21: qty 4

## 2022-12-21 MED ORDER — LACTATED RINGERS IV BOLUS
500.0000 mL | Freq: Once | INTRAVENOUS | Status: AC
  Administered 2022-12-21: 500 mL via INTRAVENOUS

## 2022-12-21 MED ORDER — SODIUM CHLORIDE 0.9 % IR SOLN
Status: DC | PRN
  Administered 2022-12-21: 3000 mL

## 2022-12-21 MED ORDER — DEXAMETHASONE SODIUM PHOSPHATE 10 MG/ML IJ SOLN
8.0000 mg | Freq: Once | INTRAMUSCULAR | Status: AC
  Administered 2022-12-21: 10 mg via INTRAVENOUS

## 2022-12-21 MED ORDER — ONDANSETRON HCL 4 MG/2ML IJ SOLN
INTRAMUSCULAR | Status: AC
  Filled 2022-12-21: qty 2

## 2022-12-21 MED ORDER — GLYCOPYRROLATE 0.2 MG/ML IJ SOLN
INTRAMUSCULAR | Status: AC
  Filled 2022-12-21: qty 1

## 2022-12-21 MED ORDER — METHOCARBAMOL 500 MG PO TABS: 500.0000 mg | ORAL_TABLET | Freq: Four times a day (QID) | ORAL | 0 refills | Status: DC

## 2022-12-21 MED ORDER — ONDANSETRON HCL 4 MG/2ML IJ SOLN
INTRAMUSCULAR | Status: DC | PRN
  Administered 2022-12-21: 4 mg via INTRAVENOUS

## 2022-12-21 MED ORDER — PHENYLEPHRINE HCL-NACL 20-0.9 MG/250ML-% IV SOLN
INTRAVENOUS | Status: DC | PRN
  Administered 2022-12-21: 40 ug/min via INTRAVENOUS

## 2022-12-21 MED ORDER — METHOCARBAMOL 500 MG PO TABS: 500.0000 mg | ORAL_TABLET | Freq: Four times a day (QID) | ORAL | Status: DC | PRN

## 2022-12-21 MED ORDER — OXYCODONE HCL 5 MG PO TABS: 5.0000 mg | ORAL_TABLET | ORAL | 0 refills | Status: DC | PRN

## 2022-12-21 MED ORDER — PHENYLEPHRINE HCL (PRESSORS) 10 MG/ML IV SOLN
INTRAVENOUS | Status: AC
  Filled 2022-12-21: qty 1

## 2022-12-21 MED ORDER — HYDROMORPHONE HCL 1 MG/ML IJ SOLN: 0.5000 mg | INTRAMUSCULAR | Status: DC | PRN

## 2022-12-21 MED ORDER — LIDOCAINE HCL (PF) 2 % IJ SOLN
INTRAMUSCULAR | Status: DC | PRN
  Administered 2022-12-21: 60 mg via INTRADERMAL

## 2022-12-21 MED ORDER — TRAMADOL HCL 50 MG PO TABS
50.0000 mg | ORAL_TABLET | Freq: Four times a day (QID) | ORAL | Status: DC
  Administered 2022-12-21: 50 mg via ORAL

## 2022-12-21 MED ORDER — LACTATED RINGERS IV SOLN: INTRAVENOUS | Status: DC

## 2022-12-21 MED ORDER — EPHEDRINE SULFATE (PRESSORS) 50 MG/ML IJ SOLN
INTRAMUSCULAR | Status: DC | PRN
  Administered 2022-12-21: 10 mg via INTRAVENOUS

## 2022-12-21 MED ORDER — GLYCOPYRROLATE 0.2 MG/ML IJ SOLN
INTRAMUSCULAR | Status: DC | PRN
  Administered 2022-12-21: .2 mg via INTRAVENOUS

## 2022-12-21 MED ORDER — STERILE WATER FOR IRRIGATION IR SOLN
Status: DC | PRN
  Administered 2022-12-21 (×2): 1000 mL

## 2022-12-21 MED ORDER — TRAMADOL HCL 50 MG PO TABS: 50.0000 mg | ORAL_TABLET | Freq: Four times a day (QID) | ORAL | 0 refills | Status: DC | PRN

## 2022-12-21 MED ORDER — BUPIVACAINE LIPOSOME 1.3 % IJ SUSP
INTRAMUSCULAR | Status: DC | PRN
  Administered 2022-12-21: 10 mL

## 2022-12-21 MED ORDER — CHLORHEXIDINE GLUCONATE 0.12 % MT SOLN
15.0000 mL | Freq: Once | OROMUCOSAL | Status: AC
  Administered 2022-12-21: 15 mL via OROMUCOSAL

## 2022-12-21 SURGICAL SUPPLY — 56 items
ADH SKN CLS APL DERMABOND .7 (GAUZE/BANDAGES/DRESSINGS) ×1
ATTUNE PSFEM LTSZ6 NARCEM KNEE (Femur) IMPLANT
ATTUNE PSRP INSR SZ6 5 KNEE (Insert) IMPLANT
BAG COUNTER SPONGE SURGICOUNT (BAG) ×1 IMPLANT
BAG SPEC THK2 15X12 ZIP CLS (MISCELLANEOUS) ×1
BAG SPNG CNTER NS LX DISP (BAG) ×1
BAG ZIPLOCK 12X15 (MISCELLANEOUS) ×1 IMPLANT
BASE TIBIA ATTUNE KNEE SYS SZ6 (Knees) IMPLANT
BLADE SAG 18X100X1.27 (BLADE) ×1 IMPLANT
BLADE SAW SGTL 11.0X1.19X90.0M (BLADE) ×1 IMPLANT
BNDG ELASTIC 4X5.8 VLCR STR LF (GAUZE/BANDAGES/DRESSINGS) ×1 IMPLANT
BNDG ELASTIC 6X5.8 VLCR STR LF (GAUZE/BANDAGES/DRESSINGS) ×1 IMPLANT
BOWL SMART MIX CTS (DISPOSABLE) ×1 IMPLANT
BSPLAT TIB 6 CMNT ROT PLAT STR (Knees) ×1 IMPLANT
CEMENT HV SMART SET (Cement) IMPLANT
COVER SURGICAL LIGHT HANDLE (MISCELLANEOUS) ×1 IMPLANT
CUFF TOURN SGL QUICK 34 (TOURNIQUET CUFF) ×1
CUFF TRNQT CYL 34X4.125X (TOURNIQUET CUFF) ×1 IMPLANT
DERMABOND ADVANCED .7 DNX12 (GAUZE/BANDAGES/DRESSINGS) ×1 IMPLANT
DRAPE INCISE IOBAN 66X45 STRL (DRAPES) ×1 IMPLANT
DRAPE U-SHAPE 47X51 STRL (DRAPES) ×1 IMPLANT
DRSG AQUACEL AG ADV 3.5X10 (GAUZE/BANDAGES/DRESSINGS) ×1 IMPLANT
DURAPREP 26ML APPLICATOR (WOUND CARE) ×2 IMPLANT
ELECT REM PT RETURN 15FT ADLT (MISCELLANEOUS) ×1 IMPLANT
GLOVE BIOGEL PI IND STRL 7.5 (GLOVE) ×1 IMPLANT
GLOVE BIOGEL PI IND STRL 8 (GLOVE) ×1 IMPLANT
GLOVE ECLIPSE 8.0 STRL XLNG CF (GLOVE) ×1 IMPLANT
GLOVE SURG ORTHO 9.0 STRL STRW (GLOVE) ×1 IMPLANT
GLOVE SURG SS PI 7.0 STRL IVOR (GLOVE) ×1 IMPLANT
GOWN SPEC L4 XLG W/TWL (GOWN DISPOSABLE) ×2 IMPLANT
HANDPIECE INTERPULSE COAX TIP (DISPOSABLE) ×1
KIT TURNOVER KIT A (KITS) IMPLANT
NS IRRIG 1000ML POUR BTL (IV SOLUTION) ×1 IMPLANT
PACK TOTAL KNEE CUSTOM (KITS) ×1 IMPLANT
PATELLA MEDIAL ATTUN 35MM KNEE (Knees) IMPLANT
PIN STEINMAN FIXATION KNEE (PIN) IMPLANT
PROTECTOR NERVE ULNAR (MISCELLANEOUS) ×1 IMPLANT
SET HNDPC FAN SPRY TIP SCT (DISPOSABLE) ×1 IMPLANT
SET PAD KNEE POSITIONER (MISCELLANEOUS) ×1 IMPLANT
SOLUTION PRONTOSAN WOUND 350ML (IRRIGATION / IRRIGATOR) ×1 IMPLANT
SPIKE FLUID TRANSFER (MISCELLANEOUS) ×1 IMPLANT
SPONGE SURGIFOAM ABS GEL 100 (HEMOSTASIS) ×1 IMPLANT
STOCKINETTE 6  STRL (DRAPES) ×1
STOCKINETTE 6 STRL (DRAPES) ×1 IMPLANT
SUT MNCRL AB 3-0 PS2 18 (SUTURE) ×1 IMPLANT
SUT VIC AB 1 CT1 27 (SUTURE) ×3
SUT VIC AB 1 CT1 27XBRD ANTBC (SUTURE) ×3 IMPLANT
SUT VIC AB 2-0 CT1 27 (SUTURE) ×2
SUT VIC AB 2-0 CT1 TAPERPNT 27 (SUTURE) ×2 IMPLANT
SUT VLOC 180 0 24IN GS25 (SUTURE) ×1 IMPLANT
SYR 50ML LL SCALE MARK (SYRINGE) ×1 IMPLANT
TAPE STRIPS DRAPE STRL (GAUZE/BANDAGES/DRESSINGS) ×1 IMPLANT
TIBIA ATTUNE KNEE SYS BASE SZ6 (Knees) ×1 IMPLANT
TRAY CATH INTERMITTENT SS 16FR (CATHETERS) ×1 IMPLANT
WATER STERILE IRR 1000ML POUR (IV SOLUTION) ×2 IMPLANT
WRAP KNEE MAXI GEL POST OP (GAUZE/BANDAGES/DRESSINGS) IMPLANT

## 2022-12-21 NOTE — Transfer of Care (Signed)
Immediate Anesthesia Transfer of Care Note  Patient: Shelby Mcguire  Procedure(s) Performed: TOTAL KNEE ARTHROPLASTY (Left: Knee)  Patient Location: PACU  Anesthesia Type:Spinal  Level of Consciousness: sedated  Airway & Oxygen Therapy: Patient connected to face mask oxygen  Post-op Assessment: Report given to RN and Post -op Vital signs reviewed and stable  Post vital signs: Reviewed and stable  Last Vitals:  Vitals Value Taken Time  BP 109/52 12/21/22 1415  Temp    Pulse 59 12/21/22 1417  Resp 13 12/21/22 1417  SpO2 98 % 12/21/22 1417  Vitals shown include unvalidated device data.  Last Pain:  Vitals:   12/21/22 0923  TempSrc:   PainSc: 0-No pain         Complications: No notable events documented.

## 2022-12-21 NOTE — Anesthesia Procedure Notes (Addendum)
Spinal  Patient location during procedure: OR Start time: 12/21/2022 11:55 AM End time: 12/21/2022 12:00 PM Reason for block: surgical anesthesia Staffing Performed: anesthesiologist  Anesthesiologist: Nolon Nations, MD Performed by: Nolon Nations, MD Authorized by: Nolon Nations, MD   Preanesthetic Checklist Completed: patient identified, IV checked, site marked, risks and benefits discussed, surgical consent, monitors and equipment checked, pre-op evaluation and timeout performed Spinal Block Patient position: sitting Prep: DuraPrep and site prepped and draped Patient monitoring: heart rate, continuous pulse ox and blood pressure Approach: right paramedian Location: L3-4 Injection technique: single-shot Needle Needle type: Spinocan  Needle gauge: 25 G Needle length: 9 cm Additional Notes Expiration date of kit checked and confirmed. Patient tolerated procedure well, without complications.

## 2022-12-21 NOTE — Interval H&P Note (Signed)
History and Physical Interval Note:  12/21/2022 9:00 AM  Shelby Mcguire  has presented today for surgery, with the diagnosis of Left knee osteoarthritis.  The various methods of treatment have been discussed with the patient and family. After consideration of risks, benefits and other options for treatment, the patient has consented to  Procedure(s) with comments: TOTAL KNEE ARTHROPLASTY (Left) - adductor canal  120 as a surgical intervention.  The patient's history has been reviewed, patient examined, no change in status, stable for surgery.  I have reviewed the patient's chart and labs.  Questions were answered to the patient's satisfaction.     Shelby Mcguire [name]

## 2022-12-21 NOTE — Anesthesia Procedure Notes (Signed)
Anesthesia Regional Block: Adductor canal block   Pre-Anesthetic Checklist: , timeout performed,  Correct Patient, Correct Site, Correct Laterality,  Correct Procedure, Correct Position, site marked,  Risks and benefits discussed,  Surgical consent,  Pre-op evaluation,  At surgeon's request and post-op pain management  Laterality: Lower and Left  Prep: chloraprep       Needles:  Injection technique: Single-shot  Needle Type: Stimiplex     Needle Length: 9cm  Needle Gauge: 21     Additional Needles:   Procedures:,,,, ultrasound used (permanent image in chart),,    Narrative:  Start time: 12/21/2022 10:56 AM End time: 12/21/2022 11:16 AM Injection made incrementally with aspirations every 5 mL.  Performed by: Personally  Anesthesiologist: Nolon Nations, MD  Additional Notes: BP cuff, EKG monitors applied. Sedation begun. Artery and nerve location verified with ultrasound. Anesthetic injected incrementally (74m), slowly, and after negative aspirations under direct u/s guidance. Good fascial/perineural spread. Tolerated well.

## 2022-12-21 NOTE — Evaluation (Signed)
Physical Therapy Evaluation Patient Details Name: Shelby Mcguire MRN: [id number] DOB: [birth year] Today's Date: 12/21/2022  History of Present Illness  Patient is 77 y.o. female s/p  L TKA on 10/28/20 with PMH significant for but not limited to: HTN, PVD, RA, OA, R TKA (2021).  Clinical Impression  Shelby Mcguire is a 77 y.o. female POD 0 s/p L TKA. Patient reports I with mobility at baseline. Patient is now limited by functional impairments (see PT problem list below) and requires min guard for transfers and gait with RW. Patient was able to ambulate 40 x 2 and 25 feet with RW and min guard and cues for safe walker management. Patient educated on safe sequencing for stair mobility and verbalized safe guarding position for people assisting with mobility. Patient instructed in exercises to facilitate ROM and circulation. Pt ed provided on pain management, LLE positioning, reducing risk of DVT, sleeping positions including side lying with pillow between knees, use of RW and completing HEP as assigned until follow up with OPPT. Patient will benefit from continued skilled PT interventions to address impairments and progress towards PLOF. Patient has met mobility goals at adequate level for discharge home; will continue to follow if pt continues acute stay to progress towards Mod I goals.        Recommendations for follow up therapy are one component of a multi-disciplinary discharge planning process, led by the attending physician.  Recommendations may be updated based on patient status, additional functional criteria and insurance authorization.  Follow Up Recommendations Follow physician's recommendations for discharge plan and follow up therapies      Assistance Recommended at Discharge Frequent or constant Supervision/Assistance  Patient can return home with the following  A little help with walking and/or transfers;A little help with bathing/dressing/bathroom;Assistance with  cooking/housework;Assist for transportation;Help with stairs or ramp for entrance    Equipment Recommendations Other (comment) (pt reports having all DME in home setting)  Recommendations for Other Services       Functional Status Assessment Patient has had a recent decline in their functional status and demonstrates the ability to make significant improvements in function in a reasonable and predictable amount of time.     Precautions / Restrictions Precautions Precautions: Fall Precaution Comments: no pillowunder the L knee Restrictions Weight Bearing Restrictions: No Other Position/Activity Restrictions: LLE WBAT      Mobility  Bed Mobility Overal bed mobility: Needs Assistance Bed Mobility: Supine to Sit     Supine to sit: Min guard     General bed mobility comments: increased time and cues with reports of dizziness negative findings with othostatic hypotension    Transfers Overall transfer level: Needs assistance Equipment used: Rolling walker (2 wheels) Transfers: Sit to/from Stand Sit to Stand: Min guard           General transfer comment: cues for proper UE, LE and AD placement    Ambulation/Gait Ambulation/Gait assistance: Min guard Gait Distance (Feet): 40 Feet Assistive device: Rolling walker (2 wheels) Gait Pattern/deviations: Step-to pattern, Antalgic, Trunk flexed Gait velocity: decreased        Stairs Stairs: Yes Stairs assistance: Min guard Stair Management: Two rails (pt ed provided on use of RW to navigate one step to enter home) Number of Stairs: 2 General stair comments: cues for sequencing and safety  Wheelchair Mobility    Modified Rankin (Stroke Patients Only)       Balance Overall balance assessment: Needs assistance Sitting-balance support: Feet supported Sitting balance-Leahy Scale: Fair  Standing balance support: No upper extremity supported (in static standing, B UE support requried for dynamic balance) Standing  balance-Leahy Scale: Fair                               Pertinent Vitals/Pain Pain Assessment Pain Assessment: 0-10 Pain Score: 3  Pain Location: L knee Pain Descriptors / Indicators: Discomfort, Sore Pain Intervention(s): Limited activity within patient's tolerance, Monitored during session, Premedicated before session, Ice applied    Home Living Family/patient expects to be discharged to:: Private residence Living Arrangements: Children Available Help at Discharge: Family Type of Home: House Home Access: Stairs to enter Entrance Stairs-Rails: None Entrance Stairs-Number of Steps: 1   Home Layout: Multi-level;Other (Comment) (B and B on first) Home Equipment: Rolling Walker (2 wheels);Shower seat;BSC/3in1      Prior Function Prior Level of Function : Independent/Modified Independent             Mobility Comments: No AD       Hand Dominance        Extremity/Trunk Assessment        Lower Extremity Assessment Lower Extremity Assessment: LLE deficits/detail LLE Deficits / Details: ankle DF 3+/5 and PF 5/5 LLE Sensation: WNL       Communication   Communication: No difficulties  Cognition Arousal/Alertness: Awake/alert Behavior During Therapy: WFL for tasks assessed/performed Overall Cognitive Status: Within Functional Limits for tasks assessed                                          General Comments      Exercises Total Joint Exercises Ankle Circles/Pumps: AROM, Both, 20 reps Quad Sets: AROM, Left, 5 reps Short Arc Quad: AROM, Left, 5 reps Heel Slides: AROM, Left, 5 reps Hip ABduction/ADduction: AROM, Left, 5 reps Straight Leg Raises: AROM, Left, 5 reps   Assessment/Plan    PT Assessment Patient needs continued PT services  PT Problem List Decreased strength;Decreased range of motion;Decreased activity tolerance;Decreased balance;Decreased mobility;Decreased knowledge of use of DME;Pain       PT Treatment  Interventions DME instruction;Gait training;Stair training;Functional mobility training;Therapeutic activities;Therapeutic exercise;Modalities;Patient/family education    PT Goals (Current goals can be found in the Care Plan section)  Acute Rehab PT Goals Patient Stated Goal: to be able to move without pain PT Goal Formulation: With patient Time For Goal Achievement: 12/28/22 Potential to Achieve Goals: Good    Frequency       Co-evaluation               AM-PAC PT "6 Clicks" Mobility  Outcome Measure Help needed turning from your back to your side while in a flat bed without using bedrails?: A Little Help needed moving from lying on your back to sitting on the side of a flat bed without using bedrails?: A Little Help needed moving to and from a bed to a chair (including a wheelchair)?: A Little Help needed standing up from a chair using your arms (e.g., wheelchair or bedside chair)?: A Little Help needed to walk in hospital room?: A Little Help needed climbing 3-5 steps with a railing? : A Little 6 Click Score: 18    End of Session Equipment Utilized During Treatment: Gait belt Activity Tolerance: Patient limited by fatigue Patient left: in bed;with call bell/phone within reach Nurse Communication: Mobility status;Other (comment) (pt unable  to void during therapy eval) PT Visit Diagnosis: Unsteadiness on feet (R26.81);Other abnormalities of gait and mobility (R26.89);Muscle weakness (generalized) (M62.81);Difficulty in walking, not elsewhere classified (R26.2);Pain Pain - Right/Left: Left Pain - part of body: Knee    Time: 1700-1749 PT Time Calculation (min) (ACUTE ONLY): 49 min   Charges:   PT Evaluation $PT Eval Low Complexity: 1 Low PT Treatments $Gait Training: 8-22 mins $Therapeutic Activity: 8-22 mins        Baird Lyons, PT   [place] Patter 12/21/2022, 6:16 PM

## 2022-12-21 NOTE — H&P (Signed)
TOTAL KNEE ADMISSION H&P  Patient is being admitted for left total knee arthroplasty.  Subjective:  Chief Complaint:left knee pain.  HPI: Shelby Mcguire, 77 y.o. female, has a history of pain and functional disability in the left knee due to arthritis and has failed non-surgical conservative treatments for greater than 12 weeks to includeNSAID's and/or analgesics and activity modification.  Onset of symptoms was gradual, starting 5 years ago with gradually worsening course since that time. The patient noted no past surgery on the left knee(s).  Patient currently rates pain in the left knee(s) at 5 out of 10 with activity. Patient has night pain, worsening of pain with activity and weight bearing, pain with passive range of motion, and joint swelling.  Patient has evidence of subchondral sclerosis, periarticular osteophytes, and joint space narrowing by imaging studies. This patient has had  No previous injury  . There is no active infection.  Patient Active Problem List   Diagnosis Date Noted   Osteoarthritis of right knee 10/28/2020   Breast cancer screening, high risk patient 08/15/2017   Varicose veins of bilateral lower extremities with other complications 0000000   Immunocompromised ([place]) 01/04/2017   Allergic rhinitis due to allergen 01/04/2017   Family history of breast cancer    Family history of ovarian cancer    Family history of colon cancer    Genetic testing 02/12/2015   Rheumatoid arthritis ([place]) 09/05/2012   Hyperlipidemia 09/05/2012   Atypical ductal hyperplasia of right breast 09/05/2012   Past Medical History:  Diagnosis Date   Arthritis    RA and Osteo   Eczema    Family history of breast cancer    Family history of colon cancer    Family history of ovarian cancer    Hypertension    Peripheral vascular disease ([place])    bi lat    Past Surgical History:  Procedure Laterality Date   ABDOMINAL HYSTERECTOMY  1999   BREAST EXCISIONAL BIOPSY Left    benign    BREAST SURGERY Right 1999   benign   CESAREAN SECTION  1982   ENDOVENOUS ABLATION SAPHENOUS VEIN W/ LASER Left 10/03/2017   endovenous laser ablation L [facility]    ENDOVENOUS ABLATION SAPHENOUS VEIN W/ LASER Right 10/24/2017   endovenous laser ablation right greater saphenous vein by Tinnie Gens MD    [place] Right 1999   [facility] [place]     age 87   TOTAL KNEE ARTHROPLASTY Right 10/28/2020   Procedure: TOTAL KNEE ARTHROPLASTY;  Surgeon: Sydnee Cabal, MD;  Location: [facility];  Service: Orthopedics;  Laterality: Right;  with adductor canal    No current facility-administered medications for this encounter.   Current Outpatient Medications  Medication Sig Dispense Refill Last Dose   ascorbic acid (VITAMIN C) 500 MG tablet Take 500 mg by mouth 2 (two) times daily with breakfast and lunch.      Biotin 300 MCG TABS Take 300 mcg by mouth daily.      Calcium Carb-Cholecalciferol ([employer] CALCIUM 600 & VITAMIN D3 PO) Take 1 tablet by mouth in the morning and at bedtime.      Carboxymethylcellulose Sodium (THERATEARS) 0.25 % SOLN Place 1 drop into both eyes 2 (two) times daily.      cholecalciferol (VITAMIN D3) 25 MCG (1000 UNIT) tablet Take 1,000 Units by mouth daily.      diltiazem (DILACOR XR) 240 MG 24 hr capsule Take 240 mg by  mouth daily.      escitalopram (LEXAPRO) 10 MG tablet Take 10 mg by mouth at bedtime.      fluticasone (FLONASE) 50 MCG/ACT nasal spray Place 2 sprays into both nostrils daily as needed for allergies.      folic acid (FOLVITE) 1 MG tablet Take 1 mg by mouth 3 (three) times daily.      hydrochlorothiazide (HYDRODIURIL) 25 MG tablet Take 25 mg by mouth daily.      L-Lysine 500 MG CAPS Take 500 mg by mouth daily.      methotrexate 2.5 MG tablet Take 8 tablets (20 mg total) by mouth once a week. Caution: Chemotherapy. Protect from light. (Patient taking differently: Take 10 mg by mouth once a week. Caution:  Chemotherapy. Protect from light. Take 10 mg in the morning and 10 mg at lunch time every Friday)      MILK THISTLE EXTRACT PO Take 500 mg by mouth 3 (three) times daily.      Omega-3 Fatty Acids (FISH OIL CONCENTRATE) 1000 MG CAPS Take 1,000 capsules by mouth 3 (three) times daily.      Polyvinyl Alcohol-Povidone PF (REFRESH) 1.4-0.6 % SOLN Place 1 drop into both eyes daily as needed (Re-wetting).      Potassium 99 MG TABS Take 99 mg by mouth daily.      predniSONE (DELTASONE) 5 MG tablet Take 10 mg by mouth daily as needed (flare ups).      Red Yeast Rice Extract 600 MG CAPS Take 600 mg by mouth 2 (two) times daily with breakfast and lunch.      TURMERIC CURCUMIN PO Take 1,000 mg by mouth 2 (two) times daily.      benzonatate (TESSALON) 100 MG capsule Take 1 capsule (100 mg total) by mouth every 8 (eight) hours. (Patient not taking: Reported on 12/06/2022) 21 capsule 0 Not Taking   Allergies  Allergen Reactions   Penicillins Other (See Comments)    Does not go with a medication she is on.    Social History   Tobacco Use   Smoking status: Former    Packs/day: 1.00    Years: 15.00    Total pack years: 15.00    Types: Cigarettes    Quit date: 03/01/1980    Years since quitting: 42.8   Smokeless tobacco: Never  Substance Use Topics   Alcohol use: No    Family History  Problem Relation Age of Onset   Diabetes Mother    Prostate cancer Father        dx late 39s   Breast cancer Sister 18   Asthma Sister    Colon cancer Maternal Aunt        dx in her late 63s   Cancer Paternal Uncle    Ovarian cancer Sister 58   Brain cancer Sister 45   Alcohol abuse Son    Lung cancer Maternal Aunt    Allergic rhinitis Neg Hx    Angioedema Neg Hx    Eczema Neg Hx    Immunodeficiency Neg Hx    Urticaria Neg Hx      Review of Systems  All other systems reviewed and are negative.   Objective:  Physical Exam Constitutional:      Appearance: Normal appearance.  Cardiovascular:      Rate and Rhythm: Normal rate and regular rhythm.  Pulmonary:     Effort: Pulmonary effort is normal.     Breath sounds: Normal breath sounds.  Musculoskeletal:  General: Swelling and tenderness (medial and lateral joint line) present.  Skin:    General: Skin is warm and dry.     Capillary Refill: Capillary refill takes less than 2 seconds.  Neurological:     General: No focal deficit present.     Mental Status: She is alert and oriented to person, place, and time.  Psychiatric:        Mood and Affect: Mood normal.        Behavior: Behavior normal.        Thought Content: Thought content normal.        Judgment: Judgment normal.     Vital signs in last 24 hours:    Labs:   Estimated body mass index is 27.99 kg/m as calculated from the following:   Height as of 12/08/22: '5\' 3"'$  (1.6 m).   Weight as of 12/08/22: 71.7 kg.   Imaging Review Plain radiographs demonstrate severe degenerative joint disease of the left knee(s). The overall alignment ismild varus. The bone quality appears to be good for age and reported activity level.      Assessment/Plan:  End stage arthritis, left knee   The patient history, physical examination, clinical judgment of the provider and imaging studies are consistent with end stage degenerative joint disease of the left knee(s) and total knee arthroplasty is deemed medically necessary. The treatment options including medical management, injection therapy arthroscopy and arthroplasty were discussed at length. The risks and benefits of total knee arthroplasty were presented and reviewed. The risks due to aseptic loosening, infection, stiffness, patella tracking problems, thromboembolic complications and other imponderables were discussed. The patient acknowledged the explanation, agreed to proceed with the plan and consent was signed. Patient is being admitted for inpatient treatment for surgery, pain control, PT, OT, prophylactic antibiotics, VTE  prophylaxis, progressive ambulation and ADL's and discharge planning. The patient is planning to be discharged  home with outpatient PT     Patient's anticipated LOS is less than 2 midnights, meeting these requirements: - Younger than 66 - Lives within 1 hour of care - Has a competent adult at home to recover with post-op recover - NO history of  - Chronic pain requiring opiods  - Diabetes  - Coronary Artery Disease  - Heart failure  - Heart attack  - Stroke  - DVT/VTE  - Cardiac arrhythmia  - Respiratory Failure/COPD  - Renal failure  - Anemia  - Advanced Liver disease

## 2022-12-21 NOTE — Anesthesia Preprocedure Evaluation (Addendum)
Anesthesia Evaluation  Patient identified by MRN, date of birth, ID band Patient awake    Reviewed: Allergy & Precautions, NPO status , Patient's Chart, lab work & pertinent test results  Airway Mallampati: II  TM Distance: >3 FB Neck ROM: Full    Dental  (+) Dental Advisory Given, Missing, Poor Dentition   Pulmonary former smoker   Pulmonary exam normal breath sounds clear to auscultation       Cardiovascular hypertension, Pt. on medications + Peripheral Vascular Disease  Normal cardiovascular exam Rhythm:Regular Rate:Normal     Neuro/Psych negative neurological ROS  negative psych ROS   GI/Hepatic negative GI ROS, Neg liver ROS,,,  Endo/Other  negative endocrine ROS    Renal/GU negative Renal ROS     Musculoskeletal  (+) Arthritis ,    Abdominal   Peds  Hematology negative hematology ROS (+)   Anesthesia Other Findings   Reproductive/Obstetrics                              Anesthesia Physical Anesthesia Plan  ASA: 2  Anesthesia Plan: Spinal   Post-op Pain Management:  Regional for Post-op pain and Regional block* and Tylenol PO (pre-op)*   Induction: Intravenous  PONV Risk Score and Plan: 3 and Ondansetron, Propofol infusion and Amisulpride  Airway Management Planned:   Additional Equipment: None  Intra-op Plan:   Post-operative Plan:   Informed Consent: I have reviewed the patients History and Physical, chart, labs and discussed the procedure including the risks, benefits and alternatives for the proposed anesthesia with the patient or authorized representative who has indicated his/her understanding and acceptance.     Dental advisory given  Plan Discussed with: CRNA  Anesthesia Plan Comments:          Anesthesia Quick Evaluation

## 2022-12-21 NOTE — Op Note (Signed)
DATE OF SURGERY:  12/21/2022  TIME: 1:46 PM  PATIENT NAME:  Shelby Mcguire    AGE: 77 y.o.   PRE-OPERATIVE DIAGNOSIS:  Left knee osteoarthritis  POST-OPERATIVE DIAGNOSIS:  Left knee osteoarthritis  PROCEDURE:  Procedure(s): TOTAL KNEE ARTHROPLASTY  SURGEON:  Jasn Xia [name]  ASSISTANT:  Leeanne Haus, PA-C, present and scrubbed throughout the case, critical for assistance with exposure, retraction, instrumentation, and closure.  OPERATIVE IMPLANTS: Depuy PFC Attune Rotating Platform.  Femur size 6, Tibia size 5, Patella size 35 3-peg oval button, with a 5 mm polyethylene insert.   PREOPERATIVE INDICATIONS:   Shelby Mcguire is a 77 y.o. year old female with end stage bone on bone arthritis of the knee who failed conservative treatment and elected for Total Knee Arthroplasty.   The risks, benefits, and alternatives were discussed at length including but not limited to the risks of infection, bleeding, nerve injury, stiffness, blood clots, the need for revision surgery, cardiopulmonary complications, among others, and they were willing to proceed.  OPERATIVE DESCRIPTION:  The patient was brought to the operative room and placed in a supine position.  Spinal anesthesia was administered.  IV antibiotics were given.  The lower extremity was prepped and draped in the usual sterile fashion.  Time out was performed.  The leg was elevated and exsanguinated and the tourniquet was inflated.  Anterior quadriceps tendon splitting approach was performed.  The patella was retracted and osteophytes were removed.  The anterior horn of the medial and lateral meniscus was removed and cruciate ligaments resected.   The distal femur was opened with the drill and the intramedullary distal femoral cutting jig was utilized, set at 5 degrees resecting 10 mm off the distal femur.  Care was taken to protect the collateral ligaments.  The distal femoral sizing jig was applied, taking care to avoid  notching.  Then the 4-in-1 cutting jig was applied and the anterior and posterior femur was cut, along with the chamfer cuts.    Then the extramedullary tibial cutting jig was utilized making the appropriate cut using the anterior tibial crest as a reference building in appropriate posterior slope.  Care was taken during the cut to protect the medial and collateral ligaments.  The proximal tibia was removed along with the posterior horns of the menisci.   The posterior medial femoral osteophytes and posterior lateral femoral osteophytes were removed.    The flexion gap was then measured and was symmetric with the extension gap, measured at 5.  I completed the distal femoral preparation using the appropriate jig to prepare the box.  The patella was then measured, and cut with the saw.    The proximal tibia sized and prepared accordingly with the reamer and the punch, and then all components were trialed with the trial insert.  The knee was found to have excellent balance and full motion.    The above named components were then cemented into place and all excess cement was removed.  The trial polyethylene component was in place during cementation, and then was exchanged for the real polyethylene component.    The knee was easily taken through a range of motion and the patella tracked well and the knee irrigated copiously and the parapatellar and subcutaneous tissue closed with vicryl, and monocryl with steri strips for the skin.  The arthrotomy was closed at 90 of flexion. The wounds were dressed with sterile gauze and the tourniquet released and the patient was awakened and returned to the PACU in  stable and satisfactory condition.  There were no complications.  Total tourniquet time was 78 minutes.

## 2022-12-22 ENCOUNTER — Encounter (HOSPITAL_COMMUNITY): Payer: Self-pay | Admitting: Specialist

## 2022-12-23 ENCOUNTER — Emergency Department (HOSPITAL_COMMUNITY): Payer: Medicare Other

## 2022-12-23 ENCOUNTER — Encounter (HOSPITAL_COMMUNITY): Payer: Self-pay

## 2022-12-23 ENCOUNTER — Other Ambulatory Visit: Payer: Self-pay

## 2022-12-23 ENCOUNTER — Inpatient Hospital Stay (HOSPITAL_COMMUNITY)
Admission: EM | Admit: 2022-12-23 | Discharge: 2022-12-26 | DRG: 981 | Disposition: A | Discharge status: Home / Self Care | Payer: Medicare Other | Attending: Internal Medicine | Admitting: Internal Medicine

## 2022-12-23 DIAGNOSIS — R7989 Other specified abnormal findings of blood chemistry: Secondary | ICD-10-CM | POA: Diagnosis present

## 2022-12-23 DIAGNOSIS — I10 Essential (primary) hypertension: Secondary | ICD-10-CM | POA: Diagnosis present

## 2022-12-23 DIAGNOSIS — Z8041 Family history of malignant neoplasm of ovary: Secondary | ICD-10-CM

## 2022-12-23 DIAGNOSIS — Z79899 Other long term (current) drug therapy: Secondary | ICD-10-CM | POA: Diagnosis not present

## 2022-12-23 DIAGNOSIS — E669 Obesity, unspecified: Secondary | ICD-10-CM | POA: Diagnosis present

## 2022-12-23 DIAGNOSIS — I739 Peripheral vascular disease, unspecified: Secondary | ICD-10-CM | POA: Diagnosis present

## 2022-12-23 DIAGNOSIS — M1712 Unilateral primary osteoarthritis, left knee: Secondary | ICD-10-CM | POA: Diagnosis present

## 2022-12-23 DIAGNOSIS — G9341 Metabolic encephalopathy: Secondary | ICD-10-CM | POA: Diagnosis present

## 2022-12-23 DIAGNOSIS — M069 Rheumatoid arthritis, unspecified: Secondary | ICD-10-CM | POA: Diagnosis not present

## 2022-12-23 DIAGNOSIS — E785 Hyperlipidemia, unspecified: Secondary | ICD-10-CM | POA: Diagnosis present

## 2022-12-23 DIAGNOSIS — R12 Heartburn: Secondary | ICD-10-CM | POA: Diagnosis present

## 2022-12-23 DIAGNOSIS — Z66 Do not resuscitate: Secondary | ICD-10-CM | POA: Diagnosis present

## 2022-12-23 DIAGNOSIS — Z79631 Long term (current) use of antimetabolite agent: Secondary | ICD-10-CM | POA: Diagnosis not present

## 2022-12-23 DIAGNOSIS — Z531 Procedure and treatment not carried out because of patient's decision for reasons of belief and group pressure: Secondary | ICD-10-CM | POA: Insufficient documentation

## 2022-12-23 DIAGNOSIS — E222 Syndrome of inappropriate secretion of antidiuretic hormone: Principal | ICD-10-CM | POA: Diagnosis present

## 2022-12-23 DIAGNOSIS — R911 Solitary pulmonary nodule: Secondary | ICD-10-CM | POA: Diagnosis present

## 2022-12-23 DIAGNOSIS — I480 Paroxysmal atrial fibrillation: Secondary | ICD-10-CM | POA: Diagnosis not present

## 2022-12-23 DIAGNOSIS — Z6830 Body mass index (BMI) 30.0-30.9, adult: Secondary | ICD-10-CM | POA: Diagnosis not present

## 2022-12-23 DIAGNOSIS — Z96653 Presence of artificial knee joint, bilateral: Secondary | ICD-10-CM | POA: Diagnosis present

## 2022-12-23 DIAGNOSIS — Z87891 Personal history of nicotine dependence: Secondary | ICD-10-CM

## 2022-12-23 DIAGNOSIS — F39 Unspecified mood [affective] disorder: Secondary | ICD-10-CM | POA: Diagnosis present

## 2022-12-23 DIAGNOSIS — Z825 Family history of asthma and other chronic lower respiratory diseases: Secondary | ICD-10-CM

## 2022-12-23 DIAGNOSIS — Z808 Family history of malignant neoplasm of other organs or systems: Secondary | ICD-10-CM

## 2022-12-23 DIAGNOSIS — E871 Hypo-osmolality and hyponatremia: Principal | ICD-10-CM | POA: Diagnosis present

## 2022-12-23 DIAGNOSIS — R079 Chest pain, unspecified: Secondary | ICD-10-CM | POA: Diagnosis present

## 2022-12-23 DIAGNOSIS — E876 Hypokalemia: Secondary | ICD-10-CM | POA: Diagnosis not present

## 2022-12-23 DIAGNOSIS — D72829 Elevated white blood cell count, unspecified: Secondary | ICD-10-CM | POA: Insufficient documentation

## 2022-12-23 DIAGNOSIS — Z801 Family history of malignant neoplasm of trachea, bronchus and lung: Secondary | ICD-10-CM

## 2022-12-23 DIAGNOSIS — Z7982 Long term (current) use of aspirin: Secondary | ICD-10-CM

## 2022-12-23 DIAGNOSIS — Z90710 Acquired absence of both cervix and uterus: Secondary | ICD-10-CM

## 2022-12-23 DIAGNOSIS — Z800 Family history of malignant neoplasm of digestive organs: Secondary | ICD-10-CM

## 2022-12-23 DIAGNOSIS — Z803 Family history of malignant neoplasm of breast: Secondary | ICD-10-CM

## 2022-12-23 DIAGNOSIS — Z833 Family history of diabetes mellitus: Secondary | ICD-10-CM

## 2022-12-23 DIAGNOSIS — Z8042 Family history of malignant neoplasm of prostate: Secondary | ICD-10-CM

## 2022-12-23 DIAGNOSIS — R42 Dizziness and giddiness: Secondary | ICD-10-CM | POA: Diagnosis present

## 2022-12-23 DIAGNOSIS — N62 Hypertrophy of breast: Secondary | ICD-10-CM | POA: Diagnosis present

## 2022-12-23 LAB — URINALYSIS, ROUTINE W REFLEX MICROSCOPIC
Bacteria, UA: NONE SEEN
Bilirubin Urine: NEGATIVE
Glucose, UA: NEGATIVE mg/dL
Ketones, ur: 20 mg/dL — AB
Leukocytes,Ua: NEGATIVE
Nitrite: NEGATIVE
Protein, ur: 30 mg/dL — AB
Specific Gravity, Urine: 1.028 (ref 1.005–1.030)
pH: 7 (ref 5.0–8.0)

## 2022-12-23 LAB — COMPREHENSIVE METABOLIC PANEL
ALT: 35 U/L (ref 0–44)
AST: 69 U/L — ABNORMAL HIGH (ref 15–41)
Albumin: 3.6 g/dL (ref 3.5–5.0)
Alkaline Phosphatase: 46 U/L (ref 38–126)
Anion gap: 11 (ref 5–15)
BUN: 7 mg/dL — ABNORMAL LOW (ref 8–23)
CO2: 28 mmol/L (ref 22–32)
Calcium: 8.6 mg/dL — ABNORMAL LOW (ref 8.9–10.3)
Chloride: 78 mmol/L — ABNORMAL LOW (ref 98–111)
Creatinine, Ser: 0.55 mg/dL (ref 0.44–1.00)
GFR, Estimated: >60 mL/min (ref >60)
Glucose, Bld: 133 mg/dL — ABNORMAL HIGH (ref 70–99)
Potassium: 3.2 mmol/L — ABNORMAL LOW (ref 3.5–5.1)
Sodium: 117 mmol/L — CL (ref 135–145)
Total Bilirubin: 1.2 mg/dL (ref 0.3–1.2)
Total Protein: 6.1 g/dL — ABNORMAL LOW (ref 6.5–8.1)

## 2022-12-23 LAB — BASIC METABOLIC PANEL
Anion gap: 12 (ref 5–15)
Anion gap: 10 (ref 5–15)
Anion gap: 11 (ref 5–15)
BUN: 6 mg/dL — ABNORMAL LOW (ref 8–23)
BUN: 7 mg/dL — ABNORMAL LOW (ref 8–23)
BUN: <5 mg/dL — ABNORMAL LOW (ref 8–23)
CO2: 26 mmol/L (ref 22–32)
CO2: 26 mmol/L (ref 22–32)
CO2: 24 mmol/L (ref 22–32)
Calcium: 8.2 mg/dL — ABNORMAL LOW (ref 8.9–10.3)
Calcium: 8.2 mg/dL — ABNORMAL LOW (ref 8.9–10.3)
Calcium: 8.2 mg/dL — ABNORMAL LOW (ref 8.9–10.3)
Chloride: 80 mmol/L — ABNORMAL LOW (ref 98–111)
Chloride: 82 mmol/L — ABNORMAL LOW (ref 98–111)
Chloride: 84 mmol/L — ABNORMAL LOW (ref 98–111)
Creatinine, Ser: 0.55 mg/dL (ref 0.44–1.00)
Creatinine, Ser: 0.57 mg/dL (ref 0.44–1.00)
Creatinine, Ser: 0.54 mg/dL (ref 0.44–1.00)
GFR, Estimated: >60 mL/min (ref >60)
GFR, Estimated: >60 mL/min (ref >60)
GFR, Estimated: >60 mL/min (ref >60)
Glucose, Bld: 117 mg/dL — ABNORMAL HIGH (ref 70–99)
Glucose, Bld: 107 mg/dL — ABNORMAL HIGH (ref 70–99)
Glucose, Bld: 96 mg/dL (ref 70–99)
Potassium: 3.3 mmol/L — ABNORMAL LOW (ref 3.5–5.1)
Potassium: 3.8 mmol/L (ref 3.5–5.1)
Potassium: 3.7 mmol/L (ref 3.5–5.1)
Sodium: 118 mmol/L — CL (ref 135–145)
Sodium: 118 mmol/L — CL (ref 135–145)
Sodium: 119 mmol/L — CL (ref 135–145)

## 2022-12-23 LAB — CREATININE, URINE, RANDOM: Creatinine, Urine: 50 mg/dL

## 2022-12-23 LAB — CBC
HCT: 32.7 % — ABNORMAL LOW (ref 36.0–46.0)
Hemoglobin: 12.2 g/dL (ref 12.0–15.0)
MCH: 31.6 pg (ref 26.0–34.0)
MCHC: 37.3 g/dL — ABNORMAL HIGH (ref 30.0–36.0)
MCV: 84.7 fL (ref 80.0–100.0)
Platelets: 221 10*3/uL (ref 150–400)
RBC: 3.86 MIL/uL — ABNORMAL LOW (ref 3.87–5.11)
RDW: 12.4 % (ref 11.5–15.5)
WBC: 13.5 10*3/uL — ABNORMAL HIGH (ref 4.0–10.5)
nRBC: 0 % (ref 0.0–0.2)

## 2022-12-23 LAB — MAGNESIUM: Magnesium: 1.7 mg/dL (ref 1.7–2.4)

## 2022-12-23 LAB — TSH: TSH: 0.843 u[IU]/mL (ref 0.350–4.500)

## 2022-12-23 LAB — SODIUM, URINE, RANDOM: Sodium, Ur: 47 mmol/L

## 2022-12-23 LAB — BRAIN NATRIURETIC PEPTIDE: B Natriuretic Peptide: 370.5 pg/mL — ABNORMAL HIGH (ref 0.0–100.0)

## 2022-12-23 LAB — OSMOLALITY, URINE: Osmolality, Ur: 463 mOsm/kg (ref 300–900)

## 2022-12-23 LAB — OSMOLALITY: Osmolality: 245 mOsm/kg — CL (ref 275–295)

## 2022-12-23 LAB — TROPONIN I (HIGH SENSITIVITY)
Troponin I (High Sensitivity): 9 ng/L (ref <18)
Troponin I (High Sensitivity): 15 ng/L (ref <18)

## 2022-12-23 LAB — CBG MONITORING, ED: Glucose-Capillary: 151 mg/dL — ABNORMAL HIGH (ref 70–99)

## 2022-12-23 LAB — AMMONIA: Ammonia: 20 umol/L (ref 9–35)

## 2022-12-23 MED ORDER — ENOXAPARIN SODIUM 40 MG/0.4ML IJ SOSY
40.0000 mg | PREFILLED_SYRINGE | INTRAMUSCULAR | Status: DC
  Administered 2022-12-23: 40 mg via SUBCUTANEOUS
  Filled 2022-12-23: qty 0.4

## 2022-12-23 MED ORDER — SODIUM CHLORIDE 0.9 % IV SOLN: INTRAVENOUS | Status: DC

## 2022-12-23 MED ORDER — CEPHALEXIN 250 MG PO CAPS
500.0000 mg | ORAL_CAPSULE | Freq: Four times a day (QID) | ORAL | Status: AC
  Administered 2022-12-23 – 2022-12-24 (×6): 500 mg via ORAL
  Filled 2022-12-23 (×6): qty 2

## 2022-12-23 MED ORDER — IOHEXOL 350 MG/ML SOLN
75.0000 mL | Freq: Once | INTRAVENOUS | Status: AC | PRN
  Administered 2022-12-23: 75 mL via INTRAVENOUS

## 2022-12-23 MED ORDER — POLYVINYL ALCOHOL 1.4 % OP SOLN: 1.0000 [drp] | OPHTHALMIC | Status: DC | PRN

## 2022-12-23 MED ORDER — METHOTREXATE SODIUM 2.5 MG PO TABS: 10.0000 mg | ORAL_TABLET | ORAL | Status: DC

## 2022-12-23 MED ORDER — ASPIRIN 81 MG PO TBEC
81.0000 mg | DELAYED_RELEASE_TABLET | Freq: Two times a day (BID) | ORAL | Status: DC
  Administered 2022-12-23: 81 mg via ORAL
  Filled 2022-12-23: qty 1

## 2022-12-23 MED ORDER — POLYETHYLENE GLYCOL 3350 17 G PO PACK
17.0000 g | PACK | Freq: Every day | ORAL | Status: DC | PRN
  Administered 2022-12-24: 17 g via ORAL
  Filled 2022-12-23: qty 1

## 2022-12-23 MED ORDER — ALUM & MAG HYDROXIDE-SIMETH 200-200-20 MG/5ML PO SUSP
30.0000 mL | Freq: Once | ORAL | Status: AC
  Administered 2022-12-23: 30 mL via ORAL
  Filled 2022-12-23: qty 30

## 2022-12-23 MED ORDER — POLYVINYL ALCOHOL-POVIDONE PF 1.4-0.6 % OP SOLN: 1.0000 [drp] | Freq: Every day | OPHTHALMIC | Status: DC | PRN

## 2022-12-23 MED ORDER — ACETAMINOPHEN 325 MG PO TABS
650.0000 mg | ORAL_TABLET | Freq: Four times a day (QID) | ORAL | Status: DC | PRN
  Administered 2022-12-25: 650 mg via ORAL
  Filled 2022-12-23: qty 2

## 2022-12-23 MED ORDER — DILTIAZEM HCL ER COATED BEADS 120 MG PO CP24: 240.0000 mg | ORAL_CAPSULE | Freq: Every day | ORAL | Status: DC

## 2022-12-23 MED ORDER — SODIUM CHLORIDE 0.9 % IV BOLUS
500.0000 mL | Freq: Once | INTRAVENOUS | Status: AC
  Administered 2022-12-23: 500 mL via INTRAVENOUS

## 2022-12-23 MED ORDER — ACETAMINOPHEN 650 MG RE SUPP: 650.0000 mg | Freq: Four times a day (QID) | RECTAL | Status: DC | PRN

## 2022-12-23 MED ORDER — SODIUM CHLORIDE 3 % IV BOLUS
50.0000 mL | Freq: Once | INTRAVENOUS | Status: AC
  Administered 2022-12-23: 50 mL via INTRAVENOUS
  Filled 2022-12-23: qty 500

## 2022-12-23 MED ORDER — LIDOCAINE VISCOUS HCL 2 % MT SOLN
15.0000 mL | Freq: Once | OROMUCOSAL | Status: AC
  Administered 2022-12-23: 15 mL via ORAL
  Filled 2022-12-23: qty 15

## 2022-12-23 MED ORDER — SODIUM CHLORIDE 0.9% FLUSH
3.0000 mL | Freq: Two times a day (BID) | INTRAVENOUS | Status: DC
  Administered 2022-12-23 – 2022-12-26 (×6): 3 mL via INTRAVENOUS

## 2022-12-23 MED ORDER — HYDRALAZINE HCL 20 MG/ML IJ SOLN
2.0000 mg | INTRAMUSCULAR | Status: DC | PRN
  Filled 2022-12-23: qty 1

## 2022-12-23 MED ORDER — TRAMADOL HCL 50 MG PO TABS
50.0000 mg | ORAL_TABLET | Freq: Four times a day (QID) | ORAL | Status: DC | PRN
  Administered 2022-12-23 – 2022-12-25 (×4): 50 mg via ORAL
  Filled 2022-12-23 (×4): qty 1

## 2022-12-23 MED ORDER — ONDANSETRON 4 MG PO TBDP
4.0000 mg | ORAL_TABLET | Freq: Once | ORAL | Status: AC
  Administered 2022-12-23: 4 mg via ORAL
  Filled 2022-12-23: qty 1

## 2022-12-23 MED ORDER — POTASSIUM CHLORIDE 20 MEQ PO PACK
60.0000 meq | PACK | Freq: Once | ORAL | Status: AC
  Administered 2022-12-23: 60 meq via ORAL
  Filled 2022-12-23: qty 3

## 2022-12-23 NOTE — ED Notes (Signed)
I reached out to Admitting about any concerns of fluid overload for this pt regarding BNP result and new oxygen requirement.  He stated that we would watch closely but to proceed as there are not initial indications on CT or xray of pulmonary edema or history of CHF.  An echo will be performed to investigate any heart failure concerns.

## 2022-12-23 NOTE — ED Notes (Signed)
Patient transported to CT 

## 2022-12-23 NOTE — Anesthesia Postprocedure Evaluation (Signed)
Anesthesia Post Note  Patient: Shelby Mcguire  Procedure(s) Performed: TOTAL KNEE ARTHROPLASTY (Left: Knee)     Patient location during evaluation: PACU Anesthesia Type: Spinal Level of consciousness: awake and alert Pain management: pain level controlled Vital Signs Assessment: post-procedure vital signs reviewed and stable Respiratory status: spontaneous breathing Cardiovascular status: stable Anesthetic complications: no  No notable events documented.  Last Vitals:  Vitals:   12/21/22 2310 12/21/22 2320  BP: (!) 134/94 138/60  Pulse: 68 65  Resp: 18 18  Temp:    SpO2: 95% 98%    Last Pain:  Vitals:   12/21/22 2320  TempSrc:   PainSc: [place]

## 2022-12-23 NOTE — ED Notes (Signed)
lab called to report Na of 117.  EDP notified

## 2022-12-23 NOTE — ED Notes (Signed)
Pt answers all orientation questions but is a little slow to respond.

## 2022-12-23 NOTE — H&P (Addendum)
History and Physical   Shelby Mcguire [id number] DOB: 1946-04-22 DOA: 12/23/2022  PCP: Leeroy Cha, MD   Patient coming from: Home  Chief Complaint: Nausea, dizziness, heartburn, slowed responses  HPI: Shelby Mcguire is a 77 y.o. female with medical history significant of rheumatoid arthritis, hyperlipidemia, peripheral vascular disease, cataract, LPR presenting with nausea dizziness with heartburn and slow responses.  Patient recently underwent knee replacement 2 days ago.  She has had some increased weakness since the surgery and today she had some mild confusion with slow responses.  Also has been reporting some chest pain described as heartburn sensation as well as some mild shortness of breath. She has some residual postop left leg pain.  She denies fevers, chills, abdominal pain, constipation, diarrhea, vomiting.  ED Course: Vital signs in the ED significant for blood pressure in [id number] systolic.  Lab workup included CMP with Sodium 117, potassium 3.2, chloride 78, glucose 133, calcium 8.6, protein 6.1, AST 69.  CBC with leukocytosis to 13.5.  Troponin normal, TSH normal, ammonia normal.  Urinalysis pending.  Serum osmolality, urine osmolality, urine sodium, urine creatinine, urine nitrogen all pending.  Chest x-ray with low volumes but no acute normality.  CT head showed no acute normality.  CT PE study was negative for PE, showed CAD and mild atelectasis with small effusions.  Patient received 500 cc bolus as well as GI cocktail in the ED.  Given some degree of altered mental status and severe hyponatremia case was discussed with critical care by the EDP upon my request for consideration of admission to the ICU for 3% normal saline. ICU providers did not feel patient met criteria for admission to the ICU/3% normal saline administration.  Review of Systems: As per HPI otherwise all other systems reviewed and are negative.  Past Medical History:  Diagnosis Date    Arthritis    RA and Osteo   Eczema    Family history of breast cancer    Family history of colon cancer    Family history of ovarian cancer    Hypertension    Peripheral vascular disease ([place])    bi lat    Past Surgical History:  Procedure Laterality Date   ABDOMINAL HYSTERECTOMY  1999   BREAST EXCISIONAL BIOPSY Left    benign   BREAST SURGERY Right 1999   benign   CESAREAN SECTION  1982   ENDOVENOUS ABLATION SAPHENOUS VEIN W/ LASER Left 10/03/2017   endovenous laser ablation L [facility]    ENDOVENOUS ABLATION SAPHENOUS VEIN W/ LASER Right 10/24/2017   endovenous laser ablation right greater saphenous vein by Tinnie Gens MD    EXCISION OF BREAST BIOPSY Right 1999   [facility] [place]     age 81   TOTAL KNEE ARTHROPLASTY Right 10/28/2020   Procedure: TOTAL KNEE ARTHROPLASTY;  Surgeon: Sydnee Cabal, MD;  Location: [facility];  Service: Orthopedics;  Laterality: Right;  with adductor canal   TOTAL KNEE ARTHROPLASTY Left 12/21/2022   Procedure: TOTAL KNEE ARTHROPLASTY;  Surgeon: Sydnee Cabal, MD;  Location: [facility];  Service: Orthopedics;  Laterality: Left;  adductor canal  120    Social History  reports that she quit smoking about 42 years ago. Her smoking use included cigarettes. She has a 15.00 pack-year smoking history. She has never used smokeless tobacco. She reports that she does not drink alcohol and does not use drugs.  Allergies  Allergen Reactions   Penicillins Other (See Comments)  Does not go with a medication she is on.    Family History  Problem Relation Age of Onset   Diabetes Mother    Prostate cancer Father        dx late 69s   Breast cancer Sister 36   Asthma Sister    Colon cancer Maternal Aunt        dx in her late 10s   Cancer Paternal Uncle    Ovarian cancer Sister 71   Brain cancer Sister 36   Alcohol abuse Son    Lung cancer Maternal Aunt    Allergic rhinitis Neg Hx    Angioedema Neg Hx    Eczema Neg Hx     Immunodeficiency Neg Hx    Urticaria Neg Hx   Reviewed on admission  Prior to Admission medications   Medication Sig Start Date End Date Taking? Authorizing Provider  ascorbic acid (VITAMIN C) 500 MG tablet Take 500 mg by mouth 2 (two) times daily with breakfast and lunch.    [provider]  aspirin EC 81 MG tablet Take 1 tablet (81 mg total) by mouth 2 (two) times daily. Swallow whole. 12/21/22 02/01/23  Drue Novel, PA  benzonatate (TESSALON) 100 MG capsule Take 1 capsule (100 mg total) by mouth every 8 (eight) hours. Patient not taking: Reported on 12/06/2022 11/06/20   Hall-Potvin, [place], PA-C  Biotin 300 MCG TABS Take 300 mcg by mouth daily.    [provider]  Calcium Carb-Cholecalciferol ([employer] CALCIUM 600 & VITAMIN D3 PO) Take 1 tablet by mouth in the morning and at bedtime.    [provider]  Carboxymethylcellulose Sodium (THERATEARS) 0.25 % SOLN Place 1 drop into both eyes 2 (two) times daily.    [provider]  cephALEXin (KEFLEX) 500 MG capsule Take 1 capsule (500 mg total) by mouth 4 (four) times daily for 3 days. 12/21/22 12/24/22  Drue Novel, PA  cholecalciferol (VITAMIN D3) 25 MCG (1000 UNIT) tablet Take 1,000 Units by mouth daily.    [provider]  diltiazem (DILACOR XR) 240 MG 24 hr capsule Take 240 mg by mouth daily. 09/16/20   [provider]  escitalopram (LEXAPRO) 10 MG tablet Take 10 mg by mouth at bedtime. 11/18/22   [provider]  fluticasone (FLONASE) 50 MCG/ACT nasal spray Place 2 sprays into both nostrils daily as needed for allergies. 04/05/16   [provider]  folic acid (FOLVITE) 1 MG tablet Take 1 mg by mouth 3 (three) times daily.    [provider]  hydrochlorothiazide (HYDRODIURIL) 25 MG tablet Take 25 mg by mouth daily. 09/20/20   [provider]  L-Lysine 500 MG CAPS Take 500 mg by mouth daily.    [provider]  methocarbamol (ROBAXIN) 500 MG  tablet Take 1 tablet (500 mg total) by mouth 4 (four) times daily. 12/21/22   Drue Novel, PA  methotrexate 2.5 MG tablet Take 8 tablets (20 mg total) by mouth once a week. Caution: Chemotherapy. Protect from light. Patient taking differently: Take 10 mg by mouth once a week. Caution: Chemotherapy. Protect from light. Take 10 mg in the morning and 10 mg at lunch time every Friday 08/16/16   Nicholas Lose, MD  MILK THISTLE EXTRACT PO Take 500 mg by mouth 3 (three) times daily.    [provider]  Omega-3 Fatty Acids (FISH OIL CONCENTRATE) 1000 MG CAPS Take 1,000 capsules by mouth 3 (three) times daily.    [provider]  ondansetron (ZOFRAN) 4 MG tablet Take 1 tablet (4 mg total) by mouth daily as needed for nausea or vomiting. 12/21/22 12/21/23  Elizabeth Sauer R, PA  oxyCODONE (ROXICODONE) 5 MG immediate release tablet Take 1 tablet (5 mg total) by mouth every 4 (four) hours as needed for severe pain. 12/21/22   Drue Novel, PA  Polyvinyl Alcohol-Povidone PF (REFRESH) 1.4-0.6 % SOLN Place 1 drop into both eyes daily as needed (Re-wetting).    [provider]  Potassium 99 MG TABS Take 99 mg by mouth daily.    [provider]  predniSONE (DELTASONE) 5 MG tablet Take 10 mg by mouth daily as needed (flare ups). 07/31/16   [provider]  Red Yeast Rice Extract 600 MG CAPS Take 600 mg by mouth 2 (two) times daily with breakfast and lunch.    [provider]  traMADol (ULTRAM) 50 MG tablet Take 1 tablet (50 mg total) by mouth every 6 (six) hours as needed. 12/21/22 12/21/23  Drue Novel, PA  TURMERIC CURCUMIN PO Take 1,000 mg by mouth 2 (two) times daily.    [provider]   Physical Exam: Vitals:   12/23/22 1045 12/23/22 1100 12/23/22 1115 12/23/22 1116  BP: (!) 166/72 (!) 180/75 (!) 180/67   Pulse: 81 79 81   Resp: 18 17 12   $ Temp:    97.6 F (36.4 C)  SpO2: 92% 91% 91%   Weight:      Height:       Physical  Exam Constitutional:      General: She is not in acute distress.    Appearance: Normal appearance.  HENT:     Head: Normocephalic and atraumatic.     Mouth/Throat:     Mouth: Mucous membranes are moist.     Pharynx: Oropharynx is clear.  Eyes:     Extraocular Movements: Extraocular movements intact.     Pupils: Pupils are equal, round, and reactive to light.  Cardiovascular:     Rate and Rhythm: Normal rate and regular rhythm.     Pulses: Normal pulses.     Heart sounds: Normal heart sounds.  Pulmonary:     Effort: Pulmonary effort is normal. No respiratory distress.     Breath sounds: Normal breath sounds.  Abdominal:     General: Bowel sounds are normal. There is no distension.     Palpations: Abdomen is soft.     Tenderness: There is no abdominal tenderness.  Musculoskeletal:        General: No swelling or deformity.  Skin:    General: Skin is warm and dry.  Neurological:     General: No focal deficit present.     Mental Status: Mental status is at baseline.    Labs on Admission: I have personally reviewed following labs and imaging studies  CBC: Recent Labs  Lab 12/23/22 0955  WBC 13.5*  HGB 12.2  HCT 32.7*  MCV 84.7  PLT [id number]    Basic Metabolic Panel: Recent Labs  Lab 12/23/22 0955  NA 117*  K 3.2*  CL 78*  CO2 28  GLUCOSE 133*  BUN 7*  CREATININE 0.55  CALCIUM 8.6*    GFR: Estimated Creatinine Clearance: 56.6 mL/min (by C-G formula based on SCr of 0.55 mg/dL).  Liver Function Tests: Recent Labs  Lab 12/23/22 0955  AST 69*  ALT 35  ALKPHOS 46  BILITOT 1.2  PROT 6.1*  ALBUMIN 3.6    Urine analysis: No results  found for: "COLORURINE", "APPEARANCEUR", "LABSPEC", "PHURINE", "GLUCOSEU", "HGBUR", "BILIRUBINUR", "KETONESUR", "PROTEINUR", "UROBILINOGEN", "NITRITE", "LEUKOCYTESUR"  Radiological Exams on Admission: CT Head Wo Contrast  Result Date: 12/23/2022 CLINICAL DATA:  Dizziness. EXAM: CT HEAD WITHOUT CONTRAST TECHNIQUE: Contiguous axial  images were obtained from the base of the skull through the vertex without intravenous contrast. RADIATION DOSE REDUCTION: This exam was performed according to the departmental dose-optimization program which includes automated exposure control, adjustment of the mA and/or kV according to patient size and/or use of iterative reconstruction technique. COMPARISON:  CT head venogram dated September 25, 2022. FINDINGS: Brain: No evidence of acute infarction, hemorrhage, hydrocephalus, extra-axial collection or mass lesion/mass effect. Vascular: Atherosclerotic vascular calcification of the carotid siphons. No hyperdense vessel. Skull: Normal. Negative for fracture or focal lesion. Sinuses/Orbits: No acute finding.  Bilateral globe colobomas. Other: None. IMPRESSION: 1. No acute intracranial abnormality. Electronically Signed   By: Titus Dubin M.D.   On: 12/23/2022 12:35   CT Angio Chest PE W/Cm &/Or Wo Cm  Result Date: 12/23/2022 CLINICAL DATA:  Heartburn, nausea and dizziness since knee replacement surgery EXAM: CT ANGIOGRAPHY CHEST WITH CONTRAST TECHNIQUE: Multidetector CT imaging of the chest was performed using the standard protocol during bolus administration of intravenous contrast. Multiplanar CT image reconstructions and MIPs were obtained to evaluate the vascular anatomy. RADIATION DOSE REDUCTION: This exam was performed according to the departmental dose-optimization program which includes automated exposure control, adjustment of the mA and/or kV according to patient size and/or use of iterative reconstruction technique. CONTRAST:  82m OMNIPAQUE IOHEXOL 350 MG/ML SOLN COMPARISON:  Chest x-ray, same date. FINDINGS: Cardiovascular: The heart is normal in size. No pericardial effusion. The aorta is within normal limits in caliber. No dissection. Scattered atherosclerotic calcification. Scattered three-vessel coronary artery calcifications. The pulmonary arterial tree is well opacified. No filling defects  to suggest pulmonary embolism. Mediastinum/Nodes: Small scattered mediastinal and hilar lymph nodes but no mass or overt adenopathy. The esophagus is grossly normal. Lungs/Pleura: Underlying emphysematous changes and pulmonary scarring. Dependent subpleural atelectasis and very small bilateral pleural effusions. Elongated bilobed right lower lobe pulmonary nodule versus 2 adjacent nodules. Maximum measurement is 12 mm. Indeterminate finding. Recommend follow-up noncontrast chest CT in 4-6 months to reassess. No other pulmonary nodules or pulmonary lesions. The central tracheobronchial tree is unremarkable. Upper Abdomen: No significant upper abdominal findings. Musculoskeletal: No significant bony findings. Review of the MIP images confirms the above findings. IMPRESSION: 1. No CT findings for pulmonary embolism. 2. Normal caliber thoracic aorta.  No dissection. 3. Scattered three-vessel coronary artery calcifications. 4. Dependent subpleural atelectasis and very small bilateral pleural effusions. 5. Elongated bilobed right lower lobe pulmonary nodule versus 2 adjacent nodules. Recommend follow-up noncontrast chest CT in 4-6 months to reassess. Emphysema (ICD10-J43.9). Electronically Signed   By: PMarijo SanesM.D.   On: 12/23/2022 12:34   DG Chest 2 View  Result Date: 12/23/2022 CLINICAL DATA:  Heartburn, nausea and dizziness after left knee replacement. EXAM: CHEST - 2 VIEW COMPARISON:  09/25/2022. FINDINGS: Low lung volumes accentuate the pulmonary vasculature and cardiomediastinal silhouette. No focal airspace opacity. Otherwise stable mediastinal contours. No pleural effusion or pneumothorax. IMPRESSION: Low lung volumes without evidence of acute cardiopulmonary disease. Electronically Signed   By: WEmmit AlexandersM.D.   On: 12/23/2022 10:09    EKG: Independently reviewed.  Sinus rhythm at 75 bpm.  Nonspecific T wave flattening.  Assessment/Plan Principal Problem:   Hyponatremia Active Problems:    Rheumatoid arthritis (HCC)   Hyperlipidemia   Severe hyponatremia, ?  mildly symptomatic. > Severe hyponatremia 117 with Lasix lab checked 2 weeks ago with sodium of 134. > Has had some nausea (but no vomiting since day of surgery).  Is on hydrochlorothiazide and Lexapro. > Appears euvolemic, Cr stable.  Did receive 500 cc IV fluid bolus in the ED. > Unclear etiology at this time.  Labs pending.  Is hyperosmolar. > Given mild neurologic symptoms and severe hyponatremia case discussed with ICU as above but did not feel patient met criteria for ICU admission for 3% saline administration at this time.  Reconsult as needed if neurologic symptoms worsen versus consult nephrology if neurologic symptoms worsen. - Monitor on progressive unit - Recheck BMP now and trend every 4 hours - Continue with 100 cc/h normal saline for now - Follow-up urine studies including urine osmolality, urine sodium, urine creatinine, urine nitrogen - Hold hydrochlorothiazide and Lexapro - N.p.o. except sips with meds ADDENDUM > Ur Osm 463, Ur Na 47; suspicious for SIADH. Minimal change with initial IVF 117 >> 118.  - One time, 50 cc bolus 3% Saline, - Continue to trend labs, with next draw after 3% has had finished infusin - Restart NS after labs redrawn  Rheumatoid arthritis - Continue home methotrexate  Leukocytosis > Suspected to be reactive at this time as no reported fever or source of infection identified thus far on chest x-ray, CTA PE, CT head. > Is on Keflex for post surgical prophylaxis which will be continued until tomorrow as prescribed. - Follow-up urinalysis - Trend fever curve and WBC.  Neurology  Elevated BNP > Unclear etiology as patient has no history of CHF and no evidence of volume overload on exam nor on chest x-ray.  Has been placed on 2 L in the ED. - Close monitoring, will consider slowing IV fluids if worsening oxygen requirement - Echocardiogram to rule out CHF  Status post left knee  replacement - Pain largely well-controlled. - Continue Keflex as above - Continue as needed tramadol (states she has been avoiding oxycodone)  Hypokalemia > Noted to have potassium of 3.2. - 60 mg once p.o. potassium - Check magnesium - Trend renal function and electrolytes as above  Hypertension - Continue home diltiazem - Holding home hydrochlorothiazide  Hyperlipidemia Peripheral vascular disease - Continue home aspirin  DVT prophylaxis: Lovenox Code Status:   DNR/DNI.  MOST form reviewed in chart, patient today states she would not want intubation which is different from MOST form. NO blood products, is Jehovah's Witness. Family Communication:  Updated at bedside Disposition Plan:   Patient is from:  Home  Anticipated DC to:  Home  Anticipated DC date:  2 to 5 days  Anticipated DC barriers: None  Consults called:  Critical care, case discussed in the ED and felt patient stable/appropriate for progressive. Admission status:  Inpatient, progressive  Severity of Illness: The appropriate patient status for this patient is INPATIENT. Inpatient status is judged to be reasonable and necessary in order to provide the required intensity of service to ensure the patient's safety. The patient's presenting symptoms, physical exam findings, and initial radiographic and laboratory data in the context of their chronic comorbidities is felt to place them at high risk for further clinical deterioration. Furthermore, it is not anticipated that the patient will be medically stable for discharge from the hospital within 2 midnights of admission.   * I certify that at the point of admission it is my clinical judgment that the patient will require inpatient hospital care spanning beyond 2  midnights from the point of admission due to high intensity of service, high risk for further deterioration and high frequency of surveillance required.Shelby Bruins MD [facility]  How to  contact the [facility] Attending or Consulting provider [place] or covering provider during after hours [place], for this patient?   Check the care team in [facility] and look for a) attending/consulting [facility] provider listed and b) the [facility] team listed Log into [email or web address] and use [place]'s universal password to access. If you do not have the password, please contact the hospital operator. Locate the [facility] provider you are looking for under [facility] and page to a number that you can be directly reached. If you still have difficulty reaching the provider, please page the [facility] (Director on Call) for the Hospitalists listed on amion for assistance.  12/23/2022, 1:36 PM

## 2022-12-23 NOTE — ED Notes (Signed)
Pt placed on 02, desats to 88-89% when resting.

## 2022-12-23 NOTE — ED Triage Notes (Signed)
Patient had left knee replacement and since has been having heart burn nausea dizziness.  Patient slow to answer questions which is not normal per family. Patient is oriented x 4.  Patient denies cp sob abd pain.

## 2022-12-23 NOTE — ED Notes (Signed)
ED TO INPATIENT HANDOFF REPORT  ED Nurse Name and Phone #: Iona Coach Name/Age/Gender Shelby Mcguire 77 y.o. female Room/Bed: TRAAC/TRAAC  Code Status   Code Status: DNR  Home/SNF/Other Home Patient oriented to: self, place, time, and situation Is this baseline? Yes   Triage Complete: Triage complete  Chief Complaint Hyponatremia [E87.1]  Triage Note Patient had left knee replacement and since has been having heart burn nausea dizziness.  Patient slow to answer questions which is not normal per family. Patient is oriented x 4.  Patient denies cp sob abd pain.    Allergies Allergies  Allergen Reactions   Penicillins Other (See Comments)    Does not go with a medication she is on.   Red Blood Cells Other (See Comments)    Jehovah's Witness    Level of Care/Admitting Diagnosis ED Disposition     ED Disposition  Admit   Condition  --   Comment  Hospital Area: [facility] [[id number]]  Level of Care: Progressive [102]  Admit to Progressive based on following criteria: NEPHROLOGY stable condition requiring close monitoring for AKI, requiring Hemodialysis or Peritoneal Dialysis either from expected electrolyte imbalance, acidosis, or fluid overload that can be managed by NIPPV or high flow oxygen.  May admit patient to [facility] or [facility] if equivalent level of care is available:: No  Covid Evaluation: Asymptomatic - no recent exposure (last 10 days) testing not required  Diagnosis: Hyponatremia [198519]  Admitting Physician: Marcelyn Bruins [id number]  Attending Physician: Marcelyn Bruins [id number]  Certification:: I certify this patient will need inpatient services for at least 2 midnights  Estimated Length of Stay: 2          B Medical/Surgery History Past Medical History:  Diagnosis Date   Arthritis    RA and Osteo   Eczema    Family history of breast cancer    Family history of colon cancer    Family history of ovarian  cancer    Hypertension    Peripheral vascular disease ([place])    bi lat   Past Surgical History:  Procedure Laterality Date   ABDOMINAL HYSTERECTOMY  1999   BREAST EXCISIONAL BIOPSY Left    benign   BREAST SURGERY Right 1999   benign   CESAREAN SECTION  1982   ENDOVENOUS ABLATION SAPHENOUS VEIN W/ LASER Left 10/03/2017   endovenous laser ablation L GSV    ENDOVENOUS ABLATION SAPHENOUS VEIN W/ LASER Right 10/24/2017   endovenous laser ablation right greater saphenous vein by Tinnie Gens MD    [place] Right 1999   [facility]     age 58   TOTAL KNEE ARTHROPLASTY Right 10/28/2020   Procedure: TOTAL KNEE ARTHROPLASTY;  Surgeon: Sydnee Cabal, MD;  Location: [facility];  Service: Orthopedics;  Laterality: Right;  with adductor canal   TOTAL KNEE ARTHROPLASTY Left 12/21/2022   Procedure: TOTAL KNEE ARTHROPLASTY;  Surgeon: Sydnee Cabal, MD;  Location: [facility];  Service: Orthopedics;  Laterality: Left;  adductor canal  120     A IV Location/Drains/Wounds Patient Lines/Drains/Airways Status     Active Line/Drains/Airways     Name Placement date Placement time Site Days   Peripheral IV 12/23/22 20 G Anterior;Proximal;Right Forearm 12/23/22  0952  Forearm  less than 1            Intake/Output Last 24 hours No intake or output data in  the 24 hours ending 12/23/22 1447  Labs/Imaging Results for orders placed or performed during the hospital encounter of 12/23/22 (from the past 48 hour(s))  CBG monitoring, ED     Status: Abnormal   Collection Time: 12/23/22  9:39 AM  Result Value Ref Range   Glucose-Capillary 151 (H) 70 - 99 mg/dL    Comment: Glucose reference range applies only to samples taken after fasting for at least 8 hours.  Comprehensive metabolic panel     Status: Abnormal   Collection Time: 12/23/22  9:55 AM  Result Value Ref Range   Sodium 117 (LL) 135 - 145 mmol/L    Comment: CRITICAL RESULT CALLED TO,  READ BACK BY AND VERIFIED WITH Renalda Locklin RN.@1136$  ON 2.16.24 BY TCALDWELL MT.   Potassium 3.2 (L) 3.5 - 5.1 mmol/L   Chloride 78 (L) 98 - 111 mmol/L   CO2 28 22 - 32 mmol/L   Glucose, Bld 133 (H) 70 - 99 mg/dL    Comment: Glucose reference range applies only to samples taken after fasting for at least 8 hours.   BUN 7 (L) 8 - 23 mg/dL   Creatinine, Ser 0.55 0.44 - 1.00 mg/dL   Calcium 8.6 (L) 8.9 - 10.3 mg/dL   Total Protein 6.1 (L) 6.5 - 8.1 g/dL   Albumin 3.6 3.5 - 5.0 g/dL   AST 69 (H) 15 - 41 U/L   ALT 35 0 - 44 U/L   Alkaline Phosphatase 46 38 - 126 U/L   Total Bilirubin 1.2 0.3 - 1.2 mg/dL   GFR, Estimated >60 >60 mL/min    Comment: (NOTE) Calculated using the CKD-EPI Creatinine Equation (2021)    Anion gap 11 5 - 15    Comment: Performed at [facility], [place] [address] [place], [place] [address]  CBC     Status: Abnormal   Collection Time: 12/23/22  9:55 AM  Result Value Ref Range   WBC 13.5 (H) 4.0 - 10.5 K/uL   RBC 3.86 (L) 3.87 - 5.11 MIL/uL   Hemoglobin 12.2 12.0 - 15.0 g/dL   HCT 32.7 (L) 36.0 - 46.0 %   MCV 84.7 80.0 - 100.0 fL   MCH 31.6 26.0 - 34.0 pg   MCHC 37.3 (H) 30.0 - 36.0 g/dL   RDW 12.4 11.5 - 15.5 %   Platelets 221 150 - 400 K/uL   nRBC 0.0 0.0 - 0.2 %    Comment: Performed at [facility], [place] [address] [place], [place] [address]  Troponin I (High Sensitivity)     Status: None   Collection Time: 12/23/22  9:55 AM  Result Value Ref Range   Troponin I (High Sensitivity) 9 <18 ng/L    Comment: (NOTE) Elevated high sensitivity troponin I (hsTnI) values and significant  changes across serial measurements may suggest ACS but many other  chronic and acute conditions are known to elevate hsTnI results.  Refer to the "Links" section for chest pain algorithms and additional  guidance. Performed at [facility], [place] [address], [place], [place] [address]   Brain natriuretic peptide     Status: Abnormal   Collection Time: 12/23/22   9:55 AM  Result Value Ref Range   B Natriuretic Peptide 370.5 (H) 0.0 - 100.0 pg/mL    Comment: Performed at [facility] [address], [place], [place] [address]  Ammonia     Status: None   Collection Time: 12/23/22  9:55 AM  Result  Value Ref Range   Ammonia 20 9 - 35 umol/L    Comment: Performed at [facility] [address] [place], [place] [address]  TSH     Status: None   Collection Time: 12/23/22  9:55 AM  Result Value Ref Range   TSH 0.843 0.350 - 4.500 uIU/mL    Comment: Performed by a 3rd Generation assay with a functional sensitivity of <=0.01 uIU/mL. Performed at [facility], [place] [address], [place], [place] [address]   Osmolality     Status: Abnormal   Collection Time: 12/23/22 12:33 PM  Result Value Ref Range   Osmolality 245 (LL) 275 - 295 mOsm/kg    Comment: REPEATED TO VERIFY CRITICAL RESULT CALLED TO, READ BACK BY AND VERIFIED WITH: Shon Hale, RN [id number] 12/23/2022 BY MACEDA,J. Performed at [facility], [place] [address], [place], [place] [address]   Urinalysis, Routine w reflex microscopic -Urine, Clean Catch     Status: Abnormal   Collection Time: 12/23/22  1:16 PM  Result Value Ref Range   Color, Urine YELLOW YELLOW   APPearance CLEAR CLEAR   Specific Gravity, Urine 1.028 1.005 - 1.030   pH 7.0 5.0 - 8.0   Glucose, UA NEGATIVE NEGATIVE mg/dL   Hgb urine dipstick SMALL (A) NEGATIVE   Bilirubin Urine NEGATIVE NEGATIVE   Ketones, ur 20 (A) NEGATIVE mg/dL   Protein, ur 30 (A) NEGATIVE mg/dL   Nitrite NEGATIVE NEGATIVE   Leukocytes,Ua NEGATIVE NEGATIVE   RBC / HPF 0-5 0 - 5 RBC/hpf   WBC, UA 0-5 0 - 5 WBC/hpf   Bacteria, UA NONE SEEN NONE SEEN   Squamous Epithelial / HPF 0-5 0 - 5 /HPF    Comment: Performed at [facility], [place] [address], [place], [place] [address]  Sodium, urine, random     Status: None   Collection Time: 12/23/22  1:16 PM  Result Value Ref Range   Sodium, Ur 47 mmol/L    Comment: Performed at [facility] [address], [place],  [address]  Creatinine, urine, random     Status: None   Collection Time: 12/23/22  1:16 PM  Result Value Ref Range   Creatinine, Urine 50 mg/dL    Comment: Performed at [facility] [address], [place],  [address]   CT Head Wo Contrast  Result Date: 12/23/2022 CLINICAL DATA:  Dizziness. EXAM: CT HEAD WITHOUT CONTRAST TECHNIQUE: Contiguous axial images were obtained from the base of the skull through the vertex without intravenous contrast. RADIATION DOSE REDUCTION: This exam was performed according to the departmental dose-optimization program which includes automated exposure control, adjustment of the mA and/or kV according to patient size and/or use of iterative reconstruction technique. COMPARISON:  CT head venogram dated September 25, 2022. FINDINGS: Brain: No evidence of acute infarction, hemorrhage, hydrocephalus, extra-axial collection or mass lesion/mass effect. Vascular: Atherosclerotic vascular calcification of the carotid siphons. No hyperdense vessel. Skull: Normal. Negative for fracture or focal lesion. Sinuses/Orbits: No acute finding.  Bilateral globe colobomas. Other: None. IMPRESSION: 1. No acute intracranial abnormality. Electronically Signed   By: Titus Dubin M.D.   On: 12/23/2022 12:35   CT Angio Chest PE W/Cm &/Or Wo Cm  Result Date: 12/23/2022 CLINICAL DATA:  Heartburn, nausea and dizziness since knee replacement surgery EXAM: CT ANGIOGRAPHY CHEST WITH CONTRAST TECHNIQUE: Multidetector CT imaging of the chest was performed using the standard protocol during bolus administration of intravenous contrast. Multiplanar  CT image reconstructions and MIPs were obtained to evaluate the vascular anatomy. RADIATION DOSE REDUCTION: This exam was performed according to the departmental dose-optimization program which includes automated exposure control, adjustment of the mA and/or kV according to patient size and/or use of  iterative reconstruction technique. CONTRAST:  96m OMNIPAQUE IOHEXOL 350 MG/ML SOLN COMPARISON:  Chest x-ray, same date. FINDINGS: Cardiovascular: The heart is normal in size. No pericardial effusion. The aorta is within normal limits in caliber. No dissection. Scattered atherosclerotic calcification. Scattered three-vessel coronary artery calcifications. The pulmonary arterial tree is well opacified. No filling defects to suggest pulmonary embolism. Mediastinum/Nodes: Small scattered mediastinal and hilar lymph nodes but no mass or overt adenopathy. The esophagus is grossly normal. Lungs/Pleura: Underlying emphysematous changes and pulmonary scarring. Dependent subpleural atelectasis and very small bilateral pleural effusions. Elongated bilobed right lower lobe pulmonary nodule versus 2 adjacent nodules. Maximum measurement is 12 mm. Indeterminate finding. Recommend follow-up noncontrast chest CT in 4-6 months to reassess. No other pulmonary nodules or pulmonary lesions. The central tracheobronchial tree is unremarkable. Upper Abdomen: No significant upper abdominal findings. Musculoskeletal: No significant bony findings. Review of the MIP images confirms the above findings. IMPRESSION: 1. No CT findings for pulmonary embolism. 2. Normal caliber thoracic aorta.  No dissection. 3. Scattered three-vessel coronary artery calcifications. 4. Dependent subpleural atelectasis and very small bilateral pleural effusions. 5. Elongated bilobed right lower lobe pulmonary nodule versus 2 adjacent nodules. Recommend follow-up noncontrast chest CT in 4-6 months to reassess. Emphysema (ICD10-J43.9). Electronically Signed   By: PMarijo SanesM.D.   On: 12/23/2022 12:34   DG Chest 2 View  Result Date: 12/23/2022 CLINICAL DATA:  Heartburn, nausea and dizziness after left knee replacement. EXAM: CHEST - 2 VIEW COMPARISON:  09/25/2022. FINDINGS: Low lung volumes accentuate the pulmonary vasculature and cardiomediastinal  silhouette. No focal airspace opacity. Otherwise stable mediastinal contours. No pleural effusion or pneumothorax. IMPRESSION: Low lung volumes without evidence of acute cardiopulmonary disease. Electronically Signed   By: WEmmit AlexandersM.D.   On: 12/23/2022 10:09    Pending Labs Unresulted Labs (From admission, onward)     Start     Ordered   12/30/22 0500  Creatinine, serum  (enoxaparin (LOVENOX)    CrCl >/= 30 ml/min)  Weekly,   R     Comments: while on enoxaparin therapy    12/23/22 1336   12/24/22 0500  CBC  Tomorrow morning,   R        12/23/22 1336   12/23/22 [id number] Basic metabolic panel  Now then every 4 hours,   R (with TIMED occurrences)      12/23/22 1336   12/23/22 1336  Magnesium  Add-on,   AD        12/23/22 1336   12/23/22 1144  Urea nitrogen, urine  Once,   URGENT        12/23/22 1143   12/23/22 1143  Osmolality, urine  Once,   URGENT        12/23/22 1143            Vitals/Pain Today's Vitals   12/23/22 1045 12/23/22 1100 12/23/22 1115 12/23/22 1116  BP: (!) 166/72 (!) 180/75 (!) 180/67   Pulse: 81 79 81   Resp: 18 17 12   $ Temp:    97.6 F (36.4 C)  SpO2: 92% 91% 91%   Weight:      Height:        Isolation Precautions No active isolations  Medications Medications  enoxaparin (LOVENOX) injection 40 mg (has no administration in time range)  sodium chloride flush (NS) 0.9 % injection 3 mL (has no administration in time range)  acetaminophen (TYLENOL) tablet 650 mg (has no administration in time range)    Or  acetaminophen (TYLENOL) suppository 650 mg (has no administration in time range)  polyethylene glycol (MIRALAX / GLYCOLAX) packet 17 g (has no administration in time range)  0.9 %  sodium chloride infusion (has no administration in time range)  polyvinyl alcohol (LIQUIFILM TEARS) 1.4 % ophthalmic solution 1 drop (has no administration in time range)  aspirin EC tablet 81 mg (has no administration in time range)  cephALEXin (KEFLEX) capsule 500  mg (has no administration in time range)  diltiazem (DILACOR XR) 24 hr capsule 240 mg (has no administration in time range)  methotrexate (RHEUMATREX) tablet 10 mg (has no administration in time range)  traMADol (ULTRAM) tablet 50 mg (has no administration in time range)  potassium chloride (KLOR-CON) packet 60 mEq (has no administration in time range)  ondansetron (ZOFRAN-ODT) disintegrating tablet 4 mg (4 mg Oral Given 12/23/22 1040)  alum & mag hydroxide-simeth (MAALOX/MYLANTA) 200-200-20 MG/5ML suspension 30 mL (30 mLs Oral Given 12/23/22 1117)    And  lidocaine (XYLOCAINE) 2 % viscous mouth solution 15 mL (15 mLs Oral Given 12/23/22 1117)  sodium chloride 0.9 % bolus 500 mL (500 mLs Intravenous New Bag/Given 12/23/22 1245)  iohexol (OMNIPAQUE) 350 MG/ML injection 75 mL (75 mLs Intravenous Contrast Given 12/23/22 1221)    Mobility walks with device     Focused Assessments     R Recommendations: See Admitting Provider Note  Report given to:   Additional Notes:

## 2022-12-23 NOTE — Progress Notes (Incomplete)
Overnight progress note  Patient has been receiving normal saline at 100 cc/h and sodium remained low (117 > 118 > 118).  She was subsequently given 50 cc bolus of 3% saline by admitting MD given mild neurologic symptoms.  Sodium has not improved much (119 on repeat labs).  I spoke to Dr. Osborne Casco with nephrology who recommended continuing treating with hypertonic saline.  Spoke to Dr. Duwayne Heck and requested ICU transfer, critical care team will see the patient.

## 2022-12-23 NOTE — Progress Notes (Signed)
Critical value: Na 119 Reported to Dr. Marlowe Sax The patient is aox4.  No confusion No new orders at this time except repeat BMET.

## 2022-12-23 NOTE — Care Management (Signed)
Patient was just discharged post knee replacement 2 days ago. OP PT ordered by office.

## 2022-12-23 NOTE — ED Provider Notes (Signed)
[facility] Provider Note  CSN: [id number] Arrival date & time: 12/23/22 [id number]  Chief Complaint(s) Heartburn  HPI Shelby Mcguire is a 77 y.o. female history of osteoarthritis with recent left knee replacement presenting to the emergency department with weakness.  Per patient and family, patient has had increased weakness since the surgery and today developed mild confusion.  Patient also reports developing sensation of chest pain today which she describes as "heartburn".  She reports mild shortness of breath.  Does have some leg pain in the left leg postoperatively which is unchanged.  No right leg pain.  No abdominal pain, dysuria, cough, fevers or chills, sore throat, runny nose, nausea or vomiting.   Past Medical History Past Medical History:  Diagnosis Date   Arthritis    RA and Osteo   Eczema    Family history of breast cancer    Family history of colon cancer    Family history of ovarian cancer    Hypertension    Peripheral vascular disease ([place])    bi lat   Patient Active Problem List   Diagnosis Date Noted   Hyponatremia 12/23/2022   Age-related nuclear cataract, bilateral 11/22/2022   Osteoarthritis of right knee 10/28/2020   Breast cancer screening, high risk patient 08/15/2017   Varicose veins of bilateral lower extremities with other complications 0000000   Immunocompromised ([place]) 01/04/2017   Allergic rhinitis due to allergen 01/04/2017   Laryngopharyngeal reflux (LPR) 11/04/2016   Family history of breast cancer    Family history of ovarian cancer    Family history of colon cancer    Genetic testing 02/12/2015   Rheumatoid arthritis ([place]) 09/05/2012   Hyperlipidemia 09/05/2012   Atypical ductal hyperplasia of right breast 09/05/2012   Home Medication(s) Prior to Admission medications   Medication Sig Start Date End Date Taking? Authorizing Provider  ascorbic acid (VITAMIN C) 500 MG tablet Take 500 mg by  mouth 2 (two) times daily with breakfast and lunch.    [provider]  aspirin EC 81 MG tablet Take 1 tablet (81 mg total) by mouth 2 (two) times daily. Swallow whole. 12/21/22 02/01/23  Drue Novel, PA  benzonatate (TESSALON) 100 MG capsule Take 1 capsule (100 mg total) by mouth every 8 (eight) hours. Patient not taking: Reported on 12/06/2022 11/06/20   Hall-Potvin, [place], PA-C  Biotin 300 MCG TABS Take 300 mcg by mouth daily.    [provider]  Calcium Carb-Cholecalciferol ([employer] CALCIUM 600 & VITAMIN D3 PO) Take 1 tablet by mouth in the morning and at bedtime.    [provider]  Carboxymethylcellulose Sodium (THERATEARS) 0.25 % SOLN Place 1 drop into both eyes 2 (two) times daily.    [provider]  cephALEXin (KEFLEX) 500 MG capsule Take 1 capsule (500 mg total) by mouth 4 (four) times daily for 3 days. 12/21/22 12/24/22  Drue Novel, PA  cholecalciferol (VITAMIN D3) 25 MCG (1000 UNIT) tablet Take 1,000 Units by mouth daily.    [provider]  diltiazem (DILACOR XR) 240 MG 24 hr capsule Take 240 mg by mouth daily. 09/16/20   [provider]  escitalopram (LEXAPRO) 10 MG tablet Take 10 mg by mouth at bedtime. 11/18/22   [provider]  fluticasone (FLONASE) 50 MCG/ACT nasal spray Place 2 sprays into both nostrils daily as needed for allergies. 04/05/16   [provider]  folic acid (FOLVITE) 1 MG tablet Take 1 mg by mouth 3 (  three) times daily.    [provider]  hydrochlorothiazide (HYDRODIURIL) 25 MG tablet Take 25 mg by mouth daily. 09/20/20   [provider]  L-Lysine 500 MG CAPS Take 500 mg by mouth daily.    [provider]  methocarbamol (ROBAXIN) 500 MG tablet Take 1 tablet (500 mg total) by mouth 4 (four) times daily. 12/21/22   Drue Novel, PA  methotrexate 2.5 MG tablet Take 8 tablets (20 mg total) by mouth once a week. Caution: Chemotherapy. Protect from light. Patient  taking differently: Take 10 mg by mouth once a week. Caution: Chemotherapy. Protect from light. Take 10 mg in the morning and 10 mg at lunch time every Friday 08/16/16   Nicholas Lose, MD  MILK THISTLE EXTRACT PO Take 500 mg by mouth 3 (three) times daily.    [provider]  Omega-3 Fatty Acids (FISH OIL CONCENTRATE) 1000 MG CAPS Take 1,000 capsules by mouth 3 (three) times daily.    [provider]  ondansetron (ZOFRAN) 4 MG tablet Take 1 tablet (4 mg total) by mouth daily as needed for nausea or vomiting. 12/21/22 12/21/23  Elizabeth Sauer R, PA  oxyCODONE (ROXICODONE) 5 MG immediate release tablet Take 1 tablet (5 mg total) by mouth every 4 (four) hours as needed for severe pain. 12/21/22   Drue Novel, PA  Polyvinyl Alcohol-Povidone PF (REFRESH) 1.4-0.6 % SOLN Place 1 drop into both eyes daily as needed (Re-wetting).    [provider]  Potassium 99 MG TABS Take 99 mg by mouth daily.    [provider]  predniSONE (DELTASONE) 5 MG tablet Take 10 mg by mouth daily as needed (flare ups). 07/31/16   [provider]  Red Yeast Rice Extract 600 MG CAPS Take 600 mg by mouth 2 (two) times daily with breakfast and lunch.    [provider]  traMADol (ULTRAM) 50 MG tablet Take 1 tablet (50 mg total) by mouth every 6 (six) hours as needed. 12/21/22 12/21/23  Drue Novel, PA  TURMERIC CURCUMIN PO Take 1,000 mg by mouth 2 (two) times daily.    [provider]                                                                                                                                    Past Surgical History Past Surgical History:  Procedure Laterality Date   ABDOMINAL HYSTERECTOMY  1999   BREAST EXCISIONAL BIOPSY Left    benign   BREAST SURGERY Right 1999   benign   CESAREAN SECTION  1982   ENDOVENOUS ABLATION SAPHENOUS VEIN W/ LASER Left 10/03/2017   endovenous laser ablation L [facility]    ENDOVENOUS ABLATION SAPHENOUS VEIN W/ LASER Right  10/24/2017   endovenous laser ablation right greater saphenous vein by Tinnie Gens MD    EXCISION OF BREAST BIOPSY Right 1999   ADH 1999   THYROIDECTOMY, PARTIAL  66   TONSILLECTOMY     age 51   TOTAL KNEE ARTHROPLASTY Right 10/28/2020   Procedure: TOTAL KNEE ARTHROPLASTY;  Surgeon: Sydnee Cabal, MD;  Location: [facility];  Service: Orthopedics;  Laterality: Right;  with adductor canal   TOTAL KNEE ARTHROPLASTY Left 12/21/2022   Procedure: TOTAL KNEE ARTHROPLASTY;  Surgeon: Sydnee Cabal, MD;  Location: [facility];  Service: Orthopedics;  Laterality: Left;  adductor canal  89   Family History Family History  Problem Relation Age of Onset   Diabetes Mother    Prostate cancer Father        dx late 55s   Breast cancer Sister 26   Asthma Sister    Colon cancer Maternal Aunt        dx in her late 21s   Cancer Paternal Uncle    Ovarian cancer Sister 67   Brain cancer Sister 66   Alcohol abuse Son    Lung cancer Maternal Aunt    Allergic rhinitis Neg Hx    Angioedema Neg Hx    Eczema Neg Hx    Immunodeficiency Neg Hx    Urticaria Neg Hx     Social History Social History   Tobacco Use   Smoking status: Former    Packs/day: 1.00    Years: 15.00    Total pack years: 15.00    Types: Cigarettes    Quit date: 03/01/1980    Years since quitting: 42.8   Smokeless tobacco: Never  Vaping Use   Vaping Use: Never used  Substance Use Topics   Alcohol use: No   Drug use: No   Allergies Penicillins  Review of Systems Review of Systems  All other systems reviewed and are negative.   Physical Exam Vital Signs  I have reviewed the triage vital signs BP (!) 180/67   Pulse 81   Temp 97.6 F (36.4 C)   Resp 12   Ht 5' 3"$  (1.6 m)   Wt 71.2 kg   SpO2 91%   BMI 27.81 kg/m  Physical Exam Vitals and nursing note reviewed.  Constitutional:      General: She is not in acute distress.    Appearance: She is well-developed.  HENT:     Head: Normocephalic and atraumatic.      Mouth/Throat:     Mouth: Mucous membranes are moist.  Eyes:     Pupils: Pupils are equal, round, and reactive to light.  Cardiovascular:     Rate and Rhythm: Normal rate and regular rhythm.     Heart sounds: No murmur heard. Pulmonary:     Effort: Pulmonary effort is normal. No respiratory distress.     Breath sounds: Normal breath sounds.  Abdominal:     General: Abdomen is flat.     Palpations: Abdomen is soft.     Tenderness: There is no abdominal tenderness.  Musculoskeletal:        General: No tenderness.     Right lower leg: No edema.     Left lower leg: No edema.  Skin:    General: Skin is warm and dry.  Neurological:     Mental Status: She is alert.     Comments: Cranial nerves II through XII grossly intact, moving all extremities equally, oriented to self, year and month, situation, hospital (but not specific hospital).  Psychiatric:        Mood and Affect: Mood normal.        Behavior: Behavior normal.     ED  Results and Treatments Labs (all labs ordered are listed, but only abnormal results are displayed) Labs Reviewed  COMPREHENSIVE METABOLIC PANEL - Abnormal; Notable for the following components:      Result Value   Sodium 117 (*)    Potassium 3.2 (*)    Chloride 78 (*)    Glucose, Bld 133 (*)    BUN 7 (*)    Calcium 8.6 (*)    Total Protein 6.1 (*)    AST 69 (*)    All other components within normal limits  CBC - Abnormal; Notable for the following components:   WBC 13.5 (*)    RBC 3.86 (*)    HCT 32.7 (*)    MCHC 37.3 (*)    All other components within normal limits  BRAIN NATRIURETIC PEPTIDE - Abnormal; Notable for the following components:   B Natriuretic Peptide 370.5 (*)    All other components within normal limits  OSMOLALITY - Abnormal; Notable for the following components:   Osmolality 245 (*)    All other components within normal limits  CBG MONITORING, ED - Abnormal; Notable for the following components:   Glucose-Capillary 151 (*)     All other components within normal limits  AMMONIA  TSH  URINALYSIS, ROUTINE W REFLEX MICROSCOPIC  OSMOLALITY, URINE  SODIUM, URINE, RANDOM  CREATININE, URINE, RANDOM  UREA NITROGEN, URINE  BASIC METABOLIC PANEL  BASIC METABOLIC PANEL  BASIC METABOLIC PANEL  BASIC METABOLIC PANEL  MAGNESIUM  TROPONIN I (HIGH SENSITIVITY)  TROPONIN I (HIGH SENSITIVITY)                                                                                                                          Radiology CT Head Wo Contrast  Result Date: 12/23/2022 CLINICAL DATA:  Dizziness. EXAM: CT HEAD WITHOUT CONTRAST TECHNIQUE: Contiguous axial images were obtained from the base of the skull through the vertex without intravenous contrast. RADIATION DOSE REDUCTION: This exam was performed according to the departmental dose-optimization program which includes automated exposure control, adjustment of the mA and/or kV according to patient size and/or use of iterative reconstruction technique. COMPARISON:  CT head venogram dated September 25, 2022. FINDINGS: Brain: No evidence of acute infarction, hemorrhage, hydrocephalus, extra-axial collection or mass lesion/mass effect. Vascular: Atherosclerotic vascular calcification of the carotid siphons. No hyperdense vessel. Skull: Normal. Negative for fracture or focal lesion. Sinuses/Orbits: No acute finding.  Bilateral globe colobomas. Other: None. IMPRESSION: 1. No acute intracranial abnormality. Electronically Signed   By: Titus Dubin M.D.   On: 12/23/2022 12:35   CT Angio Chest PE W/Cm &/Or Wo Cm  Result Date: 12/23/2022 CLINICAL DATA:  Heartburn, nausea and dizziness since knee replacement surgery EXAM: CT ANGIOGRAPHY CHEST WITH CONTRAST TECHNIQUE: Multidetector CT imaging of the chest was performed using the standard protocol during bolus administration of intravenous contrast. Multiplanar CT image reconstructions and MIPs were obtained to evaluate the vascular anatomy.  RADIATION DOSE REDUCTION: This exam was performed according to  the departmental dose-optimization program which includes automated exposure control, adjustment of the mA and/or kV according to patient size and/or use of iterative reconstruction technique. CONTRAST:  43m OMNIPAQUE IOHEXOL 350 MG/ML SOLN COMPARISON:  Chest x-ray, same date. FINDINGS: Cardiovascular: The heart is normal in size. No pericardial effusion. The aorta is within normal limits in caliber. No dissection. Scattered atherosclerotic calcification. Scattered three-vessel coronary artery calcifications. The pulmonary arterial tree is well opacified. No filling defects to suggest pulmonary embolism. Mediastinum/Nodes: Small scattered mediastinal and hilar lymph nodes but no mass or overt adenopathy. The esophagus is grossly normal. Lungs/Pleura: Underlying emphysematous changes and pulmonary scarring. Dependent subpleural atelectasis and very small bilateral pleural effusions. Elongated bilobed right lower lobe pulmonary nodule versus 2 adjacent nodules. Maximum measurement is 12 mm. Indeterminate finding. Recommend follow-up noncontrast chest CT in 4-6 months to reassess. No other pulmonary nodules or pulmonary lesions. The central tracheobronchial tree is unremarkable. Upper Abdomen: No significant upper abdominal findings. Musculoskeletal: No significant bony findings. Review of the MIP images confirms the above findings. IMPRESSION: 1. No CT findings for pulmonary embolism. 2. Normal caliber thoracic aorta.  No dissection. 3. Scattered three-vessel coronary artery calcifications. 4. Dependent subpleural atelectasis and very small bilateral pleural effusions. 5. Elongated bilobed right lower lobe pulmonary nodule versus 2 adjacent nodules. Recommend follow-up noncontrast chest CT in 4-6 months to reassess. Emphysema (ICD10-J43.9). Electronically Signed   By: PMarijo SanesM.D.   On: 12/23/2022 12:34   DG Chest 2 View  Result Date:  12/23/2022 CLINICAL DATA:  Heartburn, nausea and dizziness after left knee replacement. EXAM: CHEST - 2 VIEW COMPARISON:  09/25/2022. FINDINGS: Low lung volumes accentuate the pulmonary vasculature and cardiomediastinal silhouette. No focal airspace opacity. Otherwise stable mediastinal contours. No pleural effusion or pneumothorax. IMPRESSION: Low lung volumes without evidence of acute cardiopulmonary disease. Electronically Signed   By: WEmmit AlexandersM.D.   On: 12/23/2022 10:09    Pertinent labs & imaging results that were available during my care of the patient were reviewed by me and considered in my medical decision making (see MDM for details).  Medications Ordered in ED Medications  Polyvinyl Alcohol-Povidone PF 1.4-0.6 % SOLN 1 drop (has no administration in time range)  enoxaparin (LOVENOX) injection 40 mg (has no administration in time range)  sodium chloride flush (NS) 0.9 % injection 3 mL (has no administration in time range)  acetaminophen (TYLENOL) tablet 650 mg (has no administration in time range)    Or  acetaminophen (TYLENOL) suppository 650 mg (has no administration in time range)  polyethylene glycol (MIRALAX / GLYCOLAX) packet 17 g (has no administration in time range)  0.9 %  sodium chloride infusion (has no administration in time range)  ondansetron (ZOFRAN-ODT) disintegrating tablet 4 mg (4 mg Oral Given 12/23/22 1040)  alum & mag hydroxide-simeth (MAALOX/MYLANTA) 200-200-20 MG/5ML suspension 30 mL (30 mLs Oral Given 12/23/22 1117)    And  lidocaine (XYLOCAINE) 2 % viscous mouth solution 15 mL (15 mLs Oral Given 12/23/22 1117)  sodium chloride 0.9 % bolus 500 mL (500 mLs Intravenous New Bag/Given 12/23/22 1245)  iohexol (OMNIPAQUE) 350 MG/ML injection 75 mL (75 mLs Intravenous Contrast Given 12/23/22 1221)  Procedures .Critical  Care  Performed by: Cristie Hem, MD Authorized by: Cristie Hem, MD   Critical care provider statement:    Critical care time (minutes):  30   Critical care was necessary to treat or prevent imminent or life-threatening deterioration of the following conditions:  Metabolic crisis   Critical care was time spent personally by me on the following activities:  Development of treatment plan with patient or surrogate, discussions with consultants, evaluation of patient's response to treatment, examination of patient, ordering and review of laboratory studies, ordering and review of radiographic studies, ordering and performing treatments and interventions, pulse oximetry, re-evaluation of patient's condition and review of old charts   Care discussed with: admitting provider     (including critical care time)  Medical Decision Making / ED Course   MDM:  77 year old female presenting to the emergency department with weakness.  Patient well-appearing, vital signs notable for hypertension.  No tachycardia.  Differential patient's chest pain and includes pulmonary embolism given recent surgery so obtain CTA, also includes ACS but less likely without typical symptoms we will check troponin, chest x-ray without evidence of pneumonia, pneumothorax, may also be actually heartburn so we will trial Maalox.  Per family patient also somewhat confused, will check metabolic workup to evaluate for toxic/metabolic cause of mild confusion.  Will also look for occult infectious process with urinalysis, no abdominal tenderness to suggest intra-abdominal process, chest x-ray without evidence of pneumonia.  Will obtain CT head to evaluate for intracranial process as cause of patient's mild confusion.  Will reassess.  Clinical Course as of 12/23/22 1340  Fri Dec 23, 2022  1318 Discussed with hospitalist Dr. Trilby Drummer. Requests ICU consult. Will consult ICU [WS]  1337 ICU feels patient can be managed on  hospitalist service. Patient has been admitted by hospitalist. CTA, CT head negative. Does have incidental pulmonary nodule discussed with patient. Heartburn symptom did actually improve with maalox.  [WS]    Clinical Course User Index [WS] Cristie Hem, MD     Additional history obtained: -Additional history obtained from family -External records from outside source obtained and reviewed including: Chart review including previous notes, labs, imaging, consultation notes including recent op note   Lab Tests: -I ordered, reviewed, and interpreted labs.   The pertinent results include:   Labs Reviewed  COMPREHENSIVE METABOLIC PANEL - Abnormal; Notable for the following components:      Result Value   Sodium 117 (*)    Potassium 3.2 (*)    Chloride 78 (*)    Glucose, Bld 133 (*)    BUN 7 (*)    Calcium 8.6 (*)    Total Protein 6.1 (*)    AST 69 (*)    All other components within normal limits  CBC - Abnormal; Notable for the following components:   WBC 13.5 (*)    RBC 3.86 (*)    HCT 32.7 (*)    MCHC 37.3 (*)    All other components within normal limits  BRAIN NATRIURETIC PEPTIDE - Abnormal; Notable for the following components:   B Natriuretic Peptide 370.5 (*)    All other components within normal limits  OSMOLALITY - Abnormal; Notable for the following components:   Osmolality 245 (*)    All other components within normal limits  CBG MONITORING, ED - Abnormal; Notable for the following components:   Glucose-Capillary 151 (*)    All other components within normal limits  AMMONIA  TSH  URINALYSIS, ROUTINE W  REFLEX MICROSCOPIC  OSMOLALITY, URINE  SODIUM, URINE, RANDOM  CREATININE, URINE, RANDOM  UREA NITROGEN, URINE  BASIC METABOLIC PANEL  BASIC METABOLIC PANEL  BASIC METABOLIC PANEL  BASIC METABOLIC PANEL  MAGNESIUM  TROPONIN I (HIGH SENSITIVITY)  TROPONIN I (HIGH SENSITIVITY)    Notable for hyponatremia, mild hypokalemia, mild BNP elevation, low serum  osm  EKG   EKG Interpretation  Date/Time:  Friday December 23 2022 09:37:47 EST Ventricular Rate:  75 PR Interval:  186 QRS Duration: 106 QT Interval:  418 QTC Calculation: 466 R Axis:   45 Text Interpretation: Normal sinus rhythm Cannot rule out Anterior infarct , age undetermined Abnormal ECG When compared with ECG of 25-Sep-2022 14:22, PREVIOUS ECG IS PRESENT No significant change since last tracing Confirmed by Garnette Gunner [phone]) on 12/23/2022 10:35:52 AM         Imaging Studies ordered: I ordered imaging studies including CT head, CTA chest On my interpretation imaging demonstrates no pe or intracranial process  I independently visualized and interpreted imaging. I agree with the radiologist interpretation   Medicines ordered and prescription drug management: Meds ordered this encounter  Medications   ondansetron (ZOFRAN-ODT) disintegrating tablet 4 mg   AND Linked Order Group    alum & mag hydroxide-simeth (MAALOX/MYLANTA) 200-200-20 MG/5ML suspension 30 mL    lidocaine (XYLOCAINE) 2 % viscous mouth solution 15 mL   sodium chloride 0.9 % bolus 500 mL   iohexol (OMNIPAQUE) 350 MG/ML injection 75 mL   Polyvinyl Alcohol-Povidone PF 1.4-0.6 % SOLN 1 drop   enoxaparin (LOVENOX) injection 40 mg   sodium chloride flush (NS) 0.9 % injection 3 mL   OR Linked Order Group    acetaminophen (TYLENOL) tablet 650 mg    acetaminophen (TYLENOL) suppository 650 mg   polyethylene glycol (MIRALAX / GLYCOLAX) packet 17 g   0.9 %  sodium chloride infusion    -I have reviewed the patients home medicines and have made adjustments as needed   Consultations Obtained: I requested consultation with the intensivist,  and discussed lab and imaging findings as well as pertinent plan - they recommend: admit to medicine    Cardiac Monitoring: The patient was maintained on a cardiac monitor.  I personally viewed and interpreted the cardiac monitored which showed an underlying rhythm of:  NSR  Social Determinants of Health:  Diagnosis or treatment significantly limited by social determinants of health: former smoker   Reevaluation: After the interventions noted above, I reevaluated the patient and found that they have improved  Co morbidities that complicate the patient evaluation  Past Medical History:  Diagnosis Date   Arthritis    RA and Osteo   Eczema    Family history of breast cancer    Family history of colon cancer    Family history of ovarian cancer    Hypertension    Peripheral vascular disease ([place])    bi lat      Dispostion: Disposition decision including need for hospitalization was considered, and patient admitted to the hospital.    Final Clinical Impression(s) / ED Diagnoses Final diagnoses:  Hyponatremia     This chart was dictated using voice recognition software.  Despite best efforts to proofread,  errors can occur which can change the documentation meaning.    Cristie Hem, MD 12/23/22 1340

## 2022-12-24 ENCOUNTER — Inpatient Hospital Stay (HOSPITAL_COMMUNITY): Payer: Medicare Other

## 2022-12-24 DIAGNOSIS — E871 Hypo-osmolality and hyponatremia: Secondary | ICD-10-CM | POA: Diagnosis not present

## 2022-12-24 DIAGNOSIS — I480 Paroxysmal atrial fibrillation: Secondary | ICD-10-CM

## 2022-12-24 DIAGNOSIS — E876 Hypokalemia: Secondary | ICD-10-CM | POA: Diagnosis not present

## 2022-12-24 DIAGNOSIS — M069 Rheumatoid arthritis, unspecified: Secondary | ICD-10-CM | POA: Diagnosis not present

## 2022-12-24 LAB — BASIC METABOLIC PANEL WITH GFR
Anion gap: 11 (ref 5–15)
Anion gap: 9 (ref 5–15)
BUN: 5 mg/dL — ABNORMAL LOW (ref 8–23)
BUN: 6 mg/dL — ABNORMAL LOW (ref 8–23)
CO2: 24 mmol/L (ref 22–32)
CO2: 26 mmol/L (ref 22–32)
Calcium: 8.4 mg/dL — ABNORMAL LOW (ref 8.9–10.3)
Calcium: 8.3 mg/dL — ABNORMAL LOW (ref 8.9–10.3)
Chloride: 88 mmol/L — ABNORMAL LOW (ref 98–111)
Chloride: 93 mmol/L — ABNORMAL LOW (ref 98–111)
Creatinine, Ser: 0.57 mg/dL (ref 0.44–1.00)
Creatinine, Ser: 0.59 mg/dL (ref 0.44–1.00)
GFR, Estimated: >60 mL/min
GFR, Estimated: >60 mL/min
Glucose, Bld: 95 mg/dL (ref 70–99)
Glucose, Bld: 93 mg/dL (ref 70–99)
Potassium: 3.6 mmol/L (ref 3.5–5.1)
Potassium: 3.3 mmol/L — ABNORMAL LOW (ref 3.5–5.1)
Sodium: 123 mmol/L — ABNORMAL LOW (ref 135–145)
Sodium: 128 mmol/L — ABNORMAL LOW (ref 135–145)

## 2022-12-24 LAB — BASIC METABOLIC PANEL
Anion gap: 11 (ref 5–15)
BUN: <5 mg/dL — ABNORMAL LOW (ref 8–23)
CO2: 25 mmol/L (ref 22–32)
Calcium: 8.6 mg/dL — ABNORMAL LOW (ref 8.9–10.3)
Chloride: 91 mmol/L — ABNORMAL LOW (ref 98–111)
Creatinine, Ser: 0.51 mg/dL (ref 0.44–1.00)
GFR, Estimated: >60 mL/min (ref >60)
Glucose, Bld: 100 mg/dL — ABNORMAL HIGH (ref 70–99)
Potassium: 3.2 mmol/L — ABNORMAL LOW (ref 3.5–5.1)
Sodium: 127 mmol/L — ABNORMAL LOW (ref 135–145)

## 2022-12-24 LAB — ECHOCARDIOGRAM COMPLETE
AR max vel: 2.67 cm2
AV Area VTI: 2.65 cm2
AV Area mean vel: 2.47 cm2
AV Mean grad: 5.5 mmHg
AV Peak grad: 10.3 mmHg
Ao pk vel: 1.61 m/s
Height: 63 in
P 1/2 time: 329 msec
S' Lateral: 3 cm
Weight: 2786.61 oz

## 2022-12-24 LAB — CBC
HCT: 31.9 % — ABNORMAL LOW (ref 36.0–46.0)
Hemoglobin: 11.4 g/dL — ABNORMAL LOW (ref 12.0–15.0)
MCH: 30.6 pg (ref 26.0–34.0)
MCHC: 35.7 g/dL (ref 30.0–36.0)
MCV: 85.8 fL (ref 80.0–100.0)
Platelets: 174 K/uL (ref 150–400)
RBC: 3.72 MIL/uL — ABNORMAL LOW (ref 3.87–5.11)
RDW: 12.4 % (ref 11.5–15.5)
WBC: 10.4 K/uL (ref 4.0–10.5)
nRBC: 0 % (ref 0.0–0.2)

## 2022-12-24 LAB — UREA NITROGEN, URINE: Urea Nitrogen, Ur: 486 mg/dL

## 2022-12-24 MED ORDER — DILTIAZEM HCL 25 MG/5ML IV SOLN
10.0000 mg | Freq: Once | INTRAVENOUS | Status: AC
  Administered 2022-12-24: 10 mg via INTRAVENOUS
  Filled 2022-12-24: qty 5

## 2022-12-24 MED ORDER — DILTIAZEM HCL ER 60 MG PO CP12
60.0000 mg | ORAL_CAPSULE | Freq: Two times a day (BID) | ORAL | Status: DC
  Administered 2022-12-24 – 2022-12-26 (×4): 60 mg via ORAL
  Filled 2022-12-24 (×4): qty 1

## 2022-12-24 MED ORDER — DILTIAZEM HCL 30 MG PO TABS
30.0000 mg | ORAL_TABLET | Freq: Four times a day (QID) | ORAL | Status: DC
  Administered 2022-12-24: 30 mg via ORAL
  Filled 2022-12-24: qty 1

## 2022-12-24 MED ORDER — POTASSIUM CHLORIDE CRYS ER 20 MEQ PO TBCR
40.0000 meq | EXTENDED_RELEASE_TABLET | Freq: Once | ORAL | Status: AC
  Administered 2022-12-24: 40 meq via ORAL
  Filled 2022-12-24: qty 2

## 2022-12-24 MED ORDER — APIXABAN 5 MG PO TABS
5.0000 mg | ORAL_TABLET | Freq: Two times a day (BID) | ORAL | Status: DC
  Administered 2022-12-24 – 2022-12-26 (×5): 5 mg via ORAL
  Filled 2022-12-24 (×5): qty 1

## 2022-12-24 MED ORDER — DILTIAZEM HCL-DEXTROSE 125-5 MG/125ML-% IV SOLN (PREMIX)
5.0000 mg/h | INTRAVENOUS | Status: DC
  Filled 2022-12-24: qty 125

## 2022-12-24 NOTE — Progress Notes (Signed)
ANTICOAGULATION CONSULT NOTE - Initial Consult  Pharmacy Consult for Apixaban Indication: atrial fibrillation  Allergies  Allergen Reactions   Penicillins Other (See Comments)    Told to avoid while taking methotrexate   Red Blood Cells Other (See Comments)    [employer] - no blood products    Patient Measurements: Height: 5' 3"$  (160 cm) Weight: 79 kg (174 lb 2.6 oz) IBW/kg (Calculated) : 52.4  Vital Signs: Temp: 98.7 F (37.1 C) (02/17 0812) Temp Source: Oral (02/17 0812) BP: 161/90 (02/17 0812) Pulse Rate: 130 (02/17 0812)  Labs: Recent Labs    12/23/22 0955 12/23/22 1502 12/23/22 1648 12/23/22 1802 12/23/22 2023 12/24/22 0053 12/24/22 0552  HGB 12.2  --   --   --   --  11.4*  --   HCT 32.7*  --   --   --   --  31.9*  --   PLT 221  --   --   --   --  174  --   CREATININE 0.55   < >  --    < > 0.54 0.57 0.59  TROPONINIHS 9  --  15  --   --   --   --    < > = values in this interval not displayed.    Estimated Creatinine Clearance: 59.5 mL/min (by C-G formula based on SCr of 0.59 mg/dL).   Medical History: Past Medical History:  Diagnosis Date   Arthritis    RA and Osteo   Eczema    Family history of breast cancer    Family history of colon cancer    Family history of ovarian cancer    Hypertension    Peripheral vascular disease ([place])    bi lat    Assessment: Shelby Mcguire is a 77 yo F presenting with nausea, dizziness and slow responses. 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 She was initially started on enox 56m daily for DVT prophylaxis, but EKG on 2/17 revealed new onset atrial fibrillation. Pharmacy has been consulted for apixaban dosing for afib.   Hgb 11.4 slightly decreased, plts wnl. No s/s of bleeding noted.   Goal of Therapy:  Therapeutic anticoagulation Monitor platelets by anticoagulation protocol: Yes   Plan:  - Discontinue enoxaparin 40 daily - Start Apixaban 527mBID - Monitor CBC and s/s of  bleeding  KeTitus DubinPharmD PGY1 Pharmacy Resident 12/24/2022 9:13 AM

## 2022-12-24 NOTE — Progress Notes (Signed)
Echocardiogram 2D Echocardiogram has been performed.  Shelby Mcguire 12/24/2022, 9:57 AM

## 2022-12-24 NOTE — Consult Note (Signed)
CARDIOLOGY CONSULT NOTE  Patient ID: Shelby Mcguire MRN: [id number] DOB/AGE: 1946-02-05 77 y.o.  Admit date: 12/23/2022 Referring Physician: [facility] hospitalist Reason for Consultation: A-fib  HPI:   77 y.o. Caucasian female  with hyperlipidemia, PAD, RA, admitted with acute metabolic encephalopathy likely due to severe hyponatremia, now improving.  Cardiology consulted for management of A-fib.    Patient's grandson present in the room.  She is very functional, independent, still works at [employer].  She drinks 40 bottles of 16 ounces water in a week.  2 days before hospital admission, she was getting disoriented.  Workup was significant for hyponatremia NA 117, normal CT head, CTA chest, normal trop.  On 12/25/2022 morning at 6: 38 AM, she went into A-fib as per telemetry.  On specific questioning, she does recall feeling "heart skipping beat" around that time, although on further questioning she is not sure if she has felt similar symptoms in the past.  Also, she is very worried about stroke risk given recent history in her family.  Patient is already started on Eliquis 5 mg twice daily by Dr. Sloan Leiter.  She is also on diltiazem p.o. 30 mg every 6 hours.  Echocardiogram reviewed, details below    Past Medical History:  Diagnosis Date   Arthritis    RA and Osteo   Eczema    Family history of breast cancer    Family history of colon cancer    Family history of ovarian cancer    Hypertension    Peripheral vascular disease ([place])    bi lat     Past Surgical History:  Procedure Laterality Date   ABDOMINAL HYSTERECTOMY  1999   BREAST EXCISIONAL BIOPSY Left    benign   BREAST SURGERY Right 1999   benign   CESAREAN SECTION  1982   ENDOVENOUS ABLATION SAPHENOUS VEIN W/ LASER Left 10/03/2017   endovenous laser ablation L [facility]SV    ENDOVENOUS ABLATION SAPHENOUS VEIN W/ LASER Right 10/24/2017   endovenous laser ablation right greater saphenous vein by Tinnie Gens MD     [place]     age 21   TOTAL KNEE ARTHROPLASTY Right 10/28/2020   Procedure: TOTAL KNEE ARTHROPLASTY;  Surgeon: Sydnee Cabal, MD;  Location: [facility];  Service: Orthopedics;  Laterality: Right;  with adductor canal   TOTAL KNEE ARTHROPLASTY Left 12/21/2022   Procedure: TOTAL KNEE ARTHROPLASTY;  Surgeon: Sydnee Cabal, MD;  Location: [facility];  Service: Orthopedics;  Laterality: Left;  adductor canal  120      Family History  Problem Relation Age of Onset   Diabetes Mother    Prostate cancer Father        dx late 19s   Breast cancer Sister 46   Asthma Sister    Colon cancer Maternal Aunt        dx in her late 31s   Cancer Paternal Uncle    Ovarian cancer Sister 23   Brain cancer Sister 59   Alcohol abuse Son    Lung cancer Maternal Aunt    Allergic rhinitis Neg Hx    Angioedema Neg Hx    Eczema Neg Hx    Immunodeficiency Neg Hx    Urticaria Neg Hx      Social History: Social History   Socioeconomic History   Marital status: Divorced    Spouse name: Not on file   Number of  children: 2   Years of education: Not on file   Highest education level: Not on file  Occupational History   Not on file  Tobacco Use   Smoking status: Former    Packs/day: 1.00    Years: 15.00    Total pack years: 15.00    Types: Cigarettes    Quit date: 03/01/1980    Years since quitting: 42.8   Smokeless tobacco: Never  Vaping Use   Vaping Use: Never used  Substance and Sexual Activity   Alcohol use: No   Drug use: No   Sexual activity: Never  Other Topics Concern   Not on file  Social History Narrative   Not on file   Social Determinants of Health   Financial Resource Strain: Not on file  Food Insecurity: Not on file  Transportation Needs: Not on file  Physical Activity: Not on file  Stress: Not on file  Social Connections: Not on file  Intimate Partner Violence: Not on file      Medications Prior to Admission  Medication Sig Dispense Refill Last Dose   acetaminophen (TYLENOL) 650 MG CR tablet Take 1,300 mg by mouth daily as needed for pain.   Past Month   ASCORBIC ACID PO Take 1 tablet by mouth daily.   12/22/2022   BIOTIN PO Take 1 tablet by mouth daily.   12/22/2022   Calcium Carb-Cholecalciferol (CALCIUM + VITAMIN D3 PO) Take 1 tablet by mouth 2 (two) times daily.   12/22/2022   cephALEXin (KEFLEX) 500 MG capsule Take 1 capsule (500 mg total) by mouth 4 (four) times daily for 3 days. 12 capsule 0 12/22/2022   Cholecalciferol (VITAMIN D-3 PO) Take 1 capsule by mouth daily.   12/22/2022   diltiazem (DILT-XR) 240 MG 24 hr capsule Take 240 mg by mouth daily.   12/22/2022   escitalopram (LEXAPRO) 10 MG tablet Take 10 mg by mouth at bedtime.   [id number]   folic acid (FOLVITE) 1 MG tablet Take 1 mg by mouth 3 (three) times daily.   12/22/2022   hydrochlorothiazide (HYDRODIURIL) 25 MG tablet Take 25 mg by mouth daily.   12/22/2022   L-LYSINE PO Take 1 tablet by mouth daily.   12/22/2022   methotrexate 2.5 MG tablet Take 8 tablets (20 mg total) by mouth once a week. Caution: Chemotherapy. Protect from light. (Patient taking differently: Take 20 mg by mouth every Friday. Caution: Chemotherapy. Protect from light. Take 10 mg in the morning and 10 mg at lunch time every Friday)   Past Week   MILK THISTLE EXTRACT PO Take 1 tablet by mouth 3 (three) times daily.   12/22/2022   Omega-3 Fatty Acids (FISH OIL PO) Take 1 capsule by mouth daily.   12/22/2022   omeprazole (PRILOSEC) 20 MG capsule Take 20 mg by mouth daily as needed (heart burn).   12/22/2022   ondansetron (ZOFRAN) 4 MG tablet Take 1 tablet (4 mg total) by mouth daily as needed for nausea or vomiting. 30 tablet 1 12/22/2022   oxyCODONE (ROXICODONE) 5 MG immediate release tablet Take 1 tablet (5 mg total) by mouth every 4 (four) hours as needed for severe pain. 40 tablet 0 Past Week   Potassium 99 MG TABS Take 99 mg by mouth daily.    12/22/2022   predniSONE (DELTASONE) 10 MG tablet Take 10 mg by mouth daily as needed (RA flare).   Past Month   Red Yeast Rice Extract (RED YEAST RICE PO) Take 1 tablet by  mouth 2 (two) times daily.   12/22/2022   traMADol (ULTRAM) 50 MG tablet Take 1 tablet (50 mg total) by mouth every 6 (six) hours as needed. (Patient taking differently: Take 50 mg by mouth every 6 (six) hours as needed (pain).) 40 tablet 0 12/22/2022   Turmeric (QC TUMERIC COMPLEX PO) Take 1 tablet by mouth 2 (two) times daily.   12/22/2022   aspirin EC 81 MG tablet Take 1 tablet (81 mg total) by mouth 2 (two) times daily. Swallow whole. (Patient not taking: Reported on 12/23/2022) 84 tablet 0 Not Taking   methocarbamol (ROBAXIN) 500 MG tablet Take 1 tablet (500 mg total) by mouth 4 (four) times daily. (Patient not taking: Reported on 12/23/2022) 60 tablet 0 Not Taking    Review of Systems  Cardiovascular:  Negative for chest pain, dyspnea on exertion, leg swelling, palpitations and syncope.  Neurological:        Disorientation now resolved      Physical Exam: Physical Exam Vitals and nursing note reviewed.  Constitutional:      General: She is not in acute distress. Neck:     Vascular: No JVD.  Cardiovascular:     Rate and Rhythm: Tachycardia present. Rhythm irregular.     Heart sounds: Normal heart sounds. No murmur heard. Pulmonary:     Effort: Pulmonary effort is normal.     Breath sounds: Normal breath sounds. No wheezing or rales.  Musculoskeletal:     Right lower leg: No edema.     Left lower leg: No edema.        Imaging/tests reviewed and independently interpreted: Lab Results: CBC, BMP, trop  CT head CTA chest  Cardiac Studies:  Telemetry 12/24/2022:     Sinus rhythm to afib at 6:38 AM on 12/24/2022  EKG 12/24/2022: A-fib with RVR 120 to be primary Inferolateral ST depression, consider ischemia  Echocardiogram 12/24/2022:  1. Left ventricular ejection fraction, by estimation, is 65 to  70%. The  left ventricle has normal function. The left ventricle has no regional  wall motion abnormalities. Left ventricular diastolic parameters are  indeterminate.   2. Right ventricular systolic function is normal. The right ventricular  size is normal.   3. The mitral valve is abnormal. Trivial mitral valve regurgitation. No  evidence of mitral stenosis.   4. The aortic valve was not well visualized. There is moderate  calcification of the aortic valve. There is moderate thickening of the  aortic valve. Aortic valve regurgitation is mild. Aortic valve  sclerosis/calcification is present, without any evidence   of aortic stenosis.   5. The inferior vena cava is normal in size with greater than 50%  respiratory variability, suggesting right atrial pressure of 3 mmHg.    Assessment & Recommendations:  77 y.o. Caucasian female  with hyperlipidemia, PAD, RA, admitted with acute metabolic encephalopathy likely due to severe hyponatremia, now improving.  Cardiology consulted for management of A-fib.    A-fib: Very likely new onset as of 12/24/2022 6: 38 AM, likely precipitated by metabolic encephalopathy and severe hyponatremia. Echocardiogram is essentially normal with normal EF, and no chamber enlargement, making longstanding paroxysmal A-fib less likely. It does appear that patient felt symptoms of palpitations at the time of A-fib onset, but she is unsure if she has felt similar symptoms in the past.  Also, she is worried about stroke risks given recent history in the family. I recommend cardioversion this hospitalization, with TEE to further reduce stroke risk.  At the earliest,  this can be performed on 12/26/2022 at 8: 30 AM.  Schedule the same.  Until then, okay to continue diltiazem, will change to 60 mg every 12 hour for ease of administration, from 30 mg every 6 hours. CHA2DS2VASc score 3, annual stroke risk 3.62. She will need at least 4 to 6 weeks of anticoagulation.  Will discuss  long-term duration on outpatient follow-up.  Of note, on specific questioning, it appears that patient had endovenous vein ablation for varicose veins, and does not truly have peripheral artery disease.  Therefore, I will take this diagnosis of her chart.  Management of hyponatremia, underlying RA, as per primary team.  Discussed interpretation of tests and management recommendations with the primary team     Nigel Mormon, MD Pager: [phone] Office: [phone]

## 2022-12-24 NOTE — Evaluation (Signed)
Physical Therapy Evaluation Patient Details Name: Shelby Mcguire MRN: [id number] DOB: 26-Sep-1946 Today's Date: 12/24/2022  History of Present Illness  Patient is 77 y.o. female admitted 2/16 with hyponatremia, discovered a-fib, hypokalemia. Recent  L TKA on 2/14. with PMH significant for but not limited to: HTN, PVD, RA, OA, R TKA (2021).  Clinical Impression  Pt admitted with above diagnosis. A&O x4. Stands with mod I. Ambulates with supervision and light RW support. Reviewed gait mechanics/symmetry and safety. Progressed HEP she has with her to continue working on strength and ROM for functional progression of Lt TKA she had 2/14. HR up to 144 while standing/transferring; settles back down into 115 range, up into the 120s while ambulating. Asymptomatic. No signs of pre-syncope, or buckling of LEs. She is eager to return to OPPT to focus on recovery of Lt TKA. Pt currently with functional limitations due to the deficits listed below (see PT Problem List). Pt will benefit from skilled PT to increase their independence and safety with mobility to allow discharge to the venue listed below.          Recommendations for follow up therapy are one component of a multi-disciplinary discharge planning process, led by the attending physician.  Recommendations may be updated based on patient status, additional functional criteria and insurance authorization.  Follow Up Recommendations Follow physician's recommendations for discharge plan and follow up therapies (Return to outpatient orthopedic physical therapy for Lt total knee arthroplasty.)      Assistance Recommended at Discharge Set up Supervision/Assistance  Patient can return home with the following  A little help with bathing/dressing/bathroom;Assistance with cooking/housework;Assist for transportation;Help with stairs or ramp for entrance    Equipment Recommendations None recommended by PT  Recommendations for Other Services        Functional Status Assessment Patient has had a recent decline in their functional status and demonstrates the ability to make significant improvements in function in a reasonable and predictable amount of time.     Precautions / Restrictions Precautions Precautions: Fall;Knee Precaution Comments: Pt has handout, Reviewed precautions/positioning Restrictions Weight Bearing Restrictions: No Other Position/Activity Restrictions: LLE WBAT      Mobility  Bed Mobility               General bed mobility comments: in recliner    Transfers Overall transfer level: Modified independent Equipment used: Rolling walker (2 wheels), None Transfers: Sit to/from Stand Sit to Stand: Modified independent (Device/Increase time)           General transfer comment: Mod I, somewhat impulsive to rise, cues for taking time due to lines/ leads around chair. No LOB. performed with and without AD no assist needed.    Ambulation/Gait Ambulation/Gait assistance: Supervision Gait Distance (Feet): 95 Feet Assistive device: Rolling walker (2 wheels) Gait Pattern/deviations: Step-through pattern, Antalgic Gait velocity: decreased Gait velocity interpretation: 1.31 - 2.62 ft/sec, indicative of limited community ambulator   General Gait Details: Minimally antalgic with light use of RW for support while ambulating. Cues for symmetry and awareness. No buckling noted. HR into 120s, asymptomatic.  Stairs            Wheelchair Mobility    Modified Rankin (Stroke Patients Only)       Balance Overall balance assessment: Needs assistance Sitting-balance support: Feet supported Sitting balance-Leahy Scale: Normal     Standing balance support: No upper extremity supported, Reliant on assistive device for balance Standing balance-Leahy Scale: Fair  Pertinent Vitals/Pain Pain Assessment Pain Assessment: 0-10 Pain Score: 2  Pain Location: L  knee Pain Descriptors / Indicators: Discomfort, Sore Pain Intervention(s): Monitored during session, Repositioned    Home Living Family/patient expects to be discharged to:: Private residence Living Arrangements: Children Available Help at Discharge: Family Type of Home: House Home Access: Stairs to enter Entrance Stairs-Rails: None Entrance Stairs-Number of Steps: 1   Home Layout: Multi-level;Other (Comment) (B and B on first) Home Equipment: Rolling Walker (2 wheels);Shower seat;BSC/3in1      Prior Function Prior Level of Function : Independent/Modified Independent (using RW since TKA)             Mobility Comments: Using RW since TKA, 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       Hand Dominance   Dominant Hand: Right    Extremity/Trunk Assessment   Upper Extremity Assessment Upper Extremity Assessment: Defer to OT evaluation    Lower Extremity Assessment Lower Extremity Assessment: LLE deficits/detail LLE Deficits / Details: Good mobility as expected post op TKA at <1 week; pt with >90 degree flexion, no extensor lag. LLE Sensation: WNL       Communication   Communication: No difficulties  Cognition Arousal/Alertness: Awake/alert Behavior During Therapy: WFL for tasks assessed/performed Overall Cognitive Status: Within Functional Limits for tasks assessed                                          General Comments General comments (skin integrity, edema, etc.): HR at rest 85, up to 144 while standing but rapidly falls into 115 range. and 120s while ambulating.    Exercises Other Exercises Other Exercises: Reviewed HEP from last admission - added standing heel raise, standing gastroc stretch, standing HS curl x10 ea (40mn for stretch) holding RW for support. No physical assist needed. Reviewed seated knee flexion, low load long duration approx 5 min and progress as tolerated.   Assessment/Plan    PT Assessment Patient needs continued  PT services  PT Problem List Decreased strength;Decreased range of motion;Decreased activity tolerance;Decreased balance;Decreased mobility;Decreased knowledge of use of DME;Pain;Cardiopulmonary status limiting activity       PT Treatment Interventions DME instruction;Gait training;Stair training;Functional mobility training;Therapeutic activities;Therapeutic exercise;Modalities;Patient/family education;Balance training;Neuromuscular re-education    PT Goals (Current goals can be found in the Care Plan section)  Acute Rehab PT Goals Patient Stated Goal: Return to OPPT PT Goal Formulation: With patient Time For Goal Achievement: 01/07/23 Potential to Achieve Goals: Good    Frequency Min 3X/week     Co-evaluation               AM-PAC PT "6 Clicks" Mobility  Outcome Measure Help needed turning from your back to your side while in a flat bed without using bedrails?: None Help needed moving from lying on your back to sitting on the side of a flat bed without using bedrails?: None Help needed moving to and from a bed to a chair (including a wheelchair)?: None Help needed standing up from a chair using your arms (e.g., wheelchair or bedside chair)?: A Little Help needed to walk in hospital room?: A Little Help needed climbing 3-5 steps with a railing? : A Little 6 Click Score: 21    End of Session Equipment Utilized During Treatment: Gait belt Activity Tolerance: Patient tolerated treatment well Patient left: with call bell/phone within reach;in chair;with nursing/sitter in room Nurse Communication:  Mobility status PT Visit Diagnosis: Unsteadiness on feet (R26.81);Other abnormalities of gait and mobility (R26.89);Muscle weakness (generalized) (M62.81);Difficulty in walking, not elsewhere classified (R26.2);Pain Pain - Right/Left: Left Pain - part of body: Knee    Time: [id number] PT Time Calculation (min) (ACUTE ONLY): 22 min   Charges:   PT Evaluation $PT Eval Low  Complexity: Gonzales, PT, DPT Physical Therapist Acute Rehabilitation Services [place]   Shelby Newer 12/24/2022, 4:20 PM

## 2022-12-24 NOTE — Progress Notes (Signed)
PROGRESS NOTE        PATIENT DETAILS Name: Shelby Mcguire Age: 77 y.o. Sex: female Date of Birth: [birth year] Admit Date: 12/23/2022 Admitting Physician Marcelyn Bruins, MD [id number], Ronie Spies, MD  Brief Summary: Patient is a 77 y.o.  female with a history of RA on methotrexate, PAD, HLD-who recently underwent left knee arthroplasty on 2/14-presented with nausea, weakness-found to have hyponatremia.  Significant events: 2/16>> admit to [facility] 2/17>> A-fib RVR  Significant studies: 2/16>> CTA chest: No PE/dissection 2/16>> CT head: No acute intracranial abnormality  Significant microbiology data: None  Procedures: None  Consults: Orthopedics Cardiology  Subjective: Some palpitations this morning but otherwise no complaints.  Objective: Vitals: Blood pressure (!) 173/99, pulse (!) 107, temperature 98.7 F (37.1 C), temperature source Oral, resp. rate 14, height 5' 3"$  (1.6 m), weight 79 kg, SpO2 97 %.   Exam: Gen Exam:Alert awake-not in any distress HEENT:atraumatic, normocephalic Chest: B/L clear to auscultation anteriorly CVS:S1S2 irregular tachycardic. Abdomen:soft non tender, non distended Extremities: Left knee/leg in Ace wrap. Neurology: Non focal Skin: no rash  Pertinent Labs/Radiology:    Latest Ref Rng & Units 12/24/2022   12:53 AM 12/23/2022    9:55 AM 12/08/2022    9:34 AM  CBC  WBC 4.0 - 10.5 K/uL 10.4  13.5  6.9   Hemoglobin 12.0 - 15.0 g/dL 11.4  12.2  14.1   Hematocrit 36.0 - 46.0 % 31.9  32.7  41.5   Platelets 150 - 400 K/uL 174  221  239     Lab Results  Component Value Date   NA 127 (L) 12/24/2022   K 3.2 (L) 12/24/2022   CL 91 (L) 12/24/2022   CO2 25 12/24/2022     Assessment/Plan: Hyponatremia ?  SIADH due to pain/nausea following hip surgery versus from HCTZ/Lexapro use Admitting MD-discussed with renal MD-given 50 cc of 3% saline and subsequently started on 0.9 NS with subsequent improvement in  hyponatremia. I have discontinued IVF earlier this morning-repeat sodium levels stable at 127 Will monitor off IVF and  follow for now. Given diagnostic uncertainty and severity of hyponatremia-no plans to resume HCTZ or Lexapro on discharge. TSH stable but will check a.m. cortisol  Hypokalemia Replete/recheck  PAF with RVR Heart rate fluctuating this morning but seems to have settled down in the low 100s range Will switch her long-acting Cardizem to a short acting oral Cardizem for now Start Eliquis Check echo [facility] cardiology consulted  S/p left knee arthroplasty on 2/14 Stop aspirin as patient will be on Eliquis Continue Keflex for postsurgical prophylaxis-stop date 2/17 Orthopedics consulted PT/OT eval  HTN Will attempt to control BP on Cardizem for now No plans to resume HCTZ given severity of hyponatremia  RA Resume methotrexate postdischarge  Mood disorder Stable-Lexapro on hold due to hyponatremia  History of atypical ductal hyperplasia right breast Resume outpatient follow-up with oncology  Pulmonary nodule Seen incidentally on CTA chest-radiology recommending noncontrast CT in 4 to 6 months-will need to be done specially due to hyponatremia and concern for SIADH  Obesity: Estimated body mass index is 30.85 kg/m as calculated from the following:   Height as of this encounter: 5' 3"$  (1.6 m).   Weight as of this encounter: 79 kg.   Code status:   Code Status: DNR   DVT Prophylaxis: Place TED hose Start: 12/24/22 1122 apixaban (ELIQUIS)  tablet 5 mg    Family Communication: Grandson-Javaris Green-[phone]-updated 2/17   Disposition Plan: Status is: Inpatient Remains inpatient appropriate because: severity of illness   Planned Discharge Destination:Home health   Diet: Diet Order             Diet Heart Fluid consistency: Thin  Diet effective now                     Antimicrobial agents: Anti-infectives (From admission, onward)     Start     Dose/Rate Route Frequency Ordered Stop   12/23/22 1800  cephALEXin (KEFLEX) capsule 500 mg        500 mg Oral 4 times daily 12/23/22 1403 12/25/22 0959        MEDICATIONS: Scheduled Meds:  apixaban  5 mg Oral BID   cephALEXin  500 mg Oral QID   diltiazem  10 mg Intravenous Once   diltiazem  30 mg Oral Q6H   sodium chloride flush  3 mL Intravenous Q12H   Continuous Infusions: PRN Meds:.acetaminophen **OR** acetaminophen, hydrALAZINE, polyethylene glycol, polyvinyl alcohol, traMADol   I have personally reviewed following labs and imaging studies  LABORATORY DATA: CBC: Recent Labs  Lab 12/23/22 0955 12/24/22 0053  WBC 13.5* 10.4  HGB 12.2 11.4*  HCT 32.7* 31.9*  MCV 84.7 85.8  PLT 221 [id number]    Basic Metabolic Panel: Recent Labs  Lab 12/23/22 1502 12/23/22 1802 12/23/22 2023 12/24/22 0053 12/24/22 0552 12/24/22 1008  NA 118* 118* 119* 123* 128* 127*  K 3.3* 3.8 3.7 3.6 3.3* 3.2*  CL 80* 82* 84* 88* 93* 91*  CO2 26 26 24 24 26 25  $ GLUCOSE 117* 107* 96 95 93 100*  BUN 6* 7* <5* 5* 6* <5*  CREATININE 0.55 0.57 0.54 0.57 0.59 0.51  CALCIUM 8.2* 8.2* 8.2* 8.4* 8.3* 8.6*  MG 1.7  --   --   --   --   --     GFR: Estimated Creatinine Clearance: 59.5 mL/min (by C-G formula based on SCr of 0.51 mg/dL).  Liver Function Tests: Recent Labs  Lab 12/23/22 0955  AST 69*  ALT 35  ALKPHOS 46  BILITOT 1.2  PROT 6.1*  ALBUMIN 3.6   No results for input(s): "LIPASE", "AMYLASE" in the last 168 hours. Recent Labs  Lab 12/23/22 0955  AMMONIA 20    Coagulation Profile: No results for input(s): "INR", "PROTIME" in the last 168 hours.  Cardiac Enzymes: No results for input(s): "CKTOTAL", "CKMB", "CKMBINDEX", "TROPONINI" in the last 168 hours.  BNP (last 3 results) No results for input(s): "PROBNP" in the last 8760 hours.  Lipid Profile: No results for input(s): "CHOL", "HDL", "LDLCALC", "TRIG", "CHOLHDL", "LDLDIRECT" in the last 72 hours.  Thyroid  Function Tests: Recent Labs    12/23/22 0955  TSH 0.843    Anemia Panel: No results for input(s): "VITAMINB12", "FOLATE", "FERRITIN", "TIBC", "IRON", "RETICCTPCT" in the last 72 hours.  Urine analysis:    Component Value Date/Time   COLORURINE YELLOW 12/23/2022 1316   APPEARANCEUR CLEAR 12/23/2022 1316   LABSPEC 1.028 12/23/2022 1316   PHURINE 7.0 12/23/2022 1316   GLUCOSEU NEGATIVE 12/23/2022 1316   HGBUR SMALL (A) 12/23/2022 1316   BILIRUBINUR NEGATIVE 12/23/2022 1316   KETONESUR 20 (A) 12/23/2022 1316   PROTEINUR 30 (A) 12/23/2022 1316   NITRITE NEGATIVE 12/23/2022 1316   LEUKOCYTESUR NEGATIVE 12/23/2022 1316    Sepsis Labs: Lactic Acid, Venous No results found for: "LATICACIDVEN"  MICROBIOLOGY: No results found  for this or any previous visit (from the past 240 hour(s)).  RADIOLOGY STUDIES/RESULTS: ECHOCARDIOGRAM COMPLETE  Result Date: 12/24/2022    ECHOCARDIOGRAM REPORT   Patient Name:   Shelby Mcguire Date of Exam: 12/24/2022 Medical Rec #:  [id number]         Height:       63.0 in Accession #:    [id number]        Weight:       174.2 lb Date of Birth:  November 05, 1946        BSA:          1.823 m Patient Age:    52 years          BP:           168/70 mmHg Patient Gender: F                 HR:           110 bpm. Exam Location:  Inpatient Procedure: 2D Echo, Color Doppler and Cardiac Doppler Indications:    CHF  History:        Patient has no prior history of Echocardiogram examinations.                 Risk Factors:Dyslipidemia. Varicose veins of bilateral lower                 extremities with other complications.  Sonographer:    Ronny Flurry Referring Phys: [id number] [place]  1. Left ventricular ejection fraction, by estimation, is 65 to 70%. The left ventricle has normal function. The left ventricle has no regional wall motion abnormalities. Left ventricular diastolic parameters are indeterminate.  2. Right ventricular systolic function is normal.  The right ventricular size is normal.  3. The mitral valve is abnormal. Trivial mitral valve regurgitation. No evidence of mitral stenosis.  4. The aortic valve was not well visualized. There is moderate calcification of the aortic valve. There is moderate thickening of the aortic valve. Aortic valve regurgitation is mild. Aortic valve sclerosis/calcification is present, without any evidence  of aortic stenosis.  5. The inferior vena cava is normal in size with greater than 50% respiratory variability, suggesting right atrial pressure of 3 mmHg. FINDINGS  Left Ventricle: Left ventricular ejection fraction, by estimation, is 65 to 70%. The left ventricle has normal function. The left ventricle has no regional wall motion abnormalities. The left ventricular internal cavity size was normal in size. There is  no left ventricular hypertrophy. Left ventricular diastolic parameters are indeterminate. Right Ventricle: The right ventricular size is normal. No increase in right ventricular wall thickness. Right ventricular systolic function is normal. Left Atrium: Left atrial size was normal in size. Right Atrium: Right atrial size was normal in size. Pericardium: There is no evidence of pericardial effusion. Mitral Valve: The mitral valve is abnormal. There is mild thickening of the mitral valve leaflet(s). Mild mitral annular calcification. Trivial mitral valve regurgitation. No evidence of mitral valve stenosis. Tricuspid Valve: The tricuspid valve is normal in structure. Tricuspid valve regurgitation is not demonstrated. No evidence of tricuspid stenosis. Aortic Valve: The aortic valve was not well visualized. There is moderate calcification of the aortic valve. There is moderate thickening of the aortic valve. Aortic valve regurgitation is mild. Aortic regurgitation PHT measures 329 msec. Aortic valve sclerosis/calcification is present, without any evidence of aortic stenosis. Aortic valve mean gradient measures 5.5  mmHg. Aortic valve peak gradient measures 10.3 mmHg. Aortic valve  area, by VTI measures 2.65 cm. Pulmonic Valve: The pulmonic valve was normal in structure. Pulmonic valve regurgitation is not visualized. No evidence of pulmonic stenosis. Aorta: The aortic root is normal in size and structure. Venous: The inferior vena cava is normal in size with greater than 50% respiratory variability, suggesting right atrial pressure of 3 mmHg. IAS/Shunts: No atrial level shunt detected by color flow Doppler.  LEFT VENTRICLE PLAX 2D LVIDd:         4.40 cm LVIDs:         3.00 cm LV PW:         1.00 cm LV IVS:        0.90 cm LVOT diam:     2.10 cm LV SV:         68 LV SV Index:   38 LVOT Area:     3.46 cm  RIGHT VENTRICLE             IVC RV S prime:     12.70 cm/s  IVC diam: 0.90 cm LEFT ATRIUM             Index        RIGHT ATRIUM           Index LA diam:        3.30 cm 1.81 cm/m   RA Area:     15.60 cm LA Vol (A2C):   35.8 ml 19.64 ml/m  RA Volume:   33.40 ml  18.32 ml/m LA Vol (A4C):   26.5 ml 14.53 ml/m LA Biplane Vol: 31.3 ml 17.17 ml/m  AORTIC VALVE AV Area (Vmax):    2.67 cm AV Area (Vmean):   2.47 cm AV Area (VTI):     2.65 cm AV Vmax:           160.50 cm/s AV Vmean:          107.700 cm/s AV VTI:            0.258 m AV Peak Grad:      10.3 mmHg AV Mean Grad:      5.5 mmHg LVOT Vmax:         123.67 cm/s LVOT Vmean:        76.900 cm/s LVOT VTI:          0.198 m LVOT/AV VTI ratio: 0.77 AI PHT:            329 msec  AORTA Ao Root diam: 3.30 cm Ao Asc diam:  3.20 cm MV E velocity: 111.00 cm/s                             SHUNTS                             Systemic VTI:  0.20 m                             Systemic Diam: 2.10 cm Jenkins Rouge MD Electronically signed by Jenkins Rouge MD Signature Date/Time: 12/24/2022/10:50:28 AM    Final    CT Head Wo Contrast  Result Date: 12/23/2022 CLINICAL DATA:  Dizziness. EXAM: CT HEAD WITHOUT CONTRAST TECHNIQUE: Contiguous axial images were obtained from the base of the skull  through the vertex without intravenous contrast. RADIATION DOSE REDUCTION: This exam was performed according to the departmental dose-optimization program which  includes automated exposure control, adjustment of the mA and/or kV according to patient size and/or use of iterative reconstruction technique. COMPARISON:  CT head venogram dated September 25, 2022. FINDINGS: Brain: No evidence of acute infarction, hemorrhage, hydrocephalus, extra-axial collection or mass lesion/mass effect. Vascular: Atherosclerotic vascular calcification of the carotid siphons. No hyperdense vessel. Skull: Normal. Negative for fracture or focal lesion. Sinuses/Orbits: No acute finding.  Bilateral globe colobomas. Other: None. IMPRESSION: 1. No acute intracranial abnormality. Electronically Signed   By: Titus Dubin M.D.   On: 12/23/2022 12:35   CT Angio Chest PE W/Cm &/Or Wo Cm  Result Date: 12/23/2022 CLINICAL DATA:  Heartburn, nausea and dizziness since knee replacement surgery EXAM: CT ANGIOGRAPHY CHEST WITH CONTRAST TECHNIQUE: Multidetector CT imaging of the chest was performed using the standard protocol during bolus administration of intravenous contrast. Multiplanar CT image reconstructions and MIPs were obtained to evaluate the vascular anatomy. RADIATION DOSE REDUCTION: This exam was performed according to the departmental dose-optimization program which includes automated exposure control, adjustment of the mA and/or kV according to patient size and/or use of iterative reconstruction technique. CONTRAST:  31m OMNIPAQUE IOHEXOL 350 MG/ML SOLN COMPARISON:  Chest x-ray, same date. FINDINGS: Cardiovascular: The heart is normal in size. No pericardial effusion. The aorta is within normal limits in caliber. No dissection. Scattered atherosclerotic calcification. Scattered three-vessel coronary artery calcifications. The pulmonary arterial tree is well opacified. No filling defects to suggest pulmonary embolism.  Mediastinum/Nodes: Small scattered mediastinal and hilar lymph nodes but no mass or overt adenopathy. The esophagus is grossly normal. Lungs/Pleura: Underlying emphysematous changes and pulmonary scarring. Dependent subpleural atelectasis and very small bilateral pleural effusions. Elongated bilobed right lower lobe pulmonary nodule versus 2 adjacent nodules. Maximum measurement is 12 mm. Indeterminate finding. Recommend follow-up noncontrast chest CT in 4-6 months to reassess. No other pulmonary nodules or pulmonary lesions. The central tracheobronchial tree is unremarkable. Upper Abdomen: No significant upper abdominal findings. Musculoskeletal: No significant bony findings. Review of the MIP images confirms the above findings. IMPRESSION: 1. No CT findings for pulmonary embolism. 2. Normal caliber thoracic aorta.  No dissection. 3. Scattered three-vessel coronary artery calcifications. 4. Dependent subpleural atelectasis and very small bilateral pleural effusions. 5. Elongated bilobed right lower lobe pulmonary nodule versus 2 adjacent nodules. Recommend follow-up noncontrast chest CT in 4-6 months to reassess. Emphysema (ICD10-J43.9). Electronically Signed   By: PMarijo SanesM.D.   On: 12/23/2022 12:34   DG Chest 2 View  Result Date: 12/23/2022 CLINICAL DATA:  Heartburn, nausea and dizziness after left knee replacement. EXAM: CHEST - 2 VIEW COMPARISON:  09/25/2022. FINDINGS: Low lung volumes accentuate the pulmonary vasculature and cardiomediastinal silhouette. No focal airspace opacity. Otherwise stable mediastinal contours. No pleural effusion or pneumothorax. IMPRESSION: Low lung volumes without evidence of acute cardiopulmonary disease. Electronically Signed   By: WEmmit AlexandersM.D.   On: 12/23/2022 10:09     LOS: 1 day   SOren Binet MD  [facility]    To contact the attending provider between 7A-7P or the covering provider during after hours 7P-7A, please log into the web site  [email or web address] and access using universal [place] password for that web site. If you do not have the password, please call the hospital operator.  12/24/2022, 11:40 AM

## 2022-12-24 NOTE — Progress Notes (Signed)
    Subjective: Patient seen in rounds for Dr. Theda Sers. She is S/p left TKA 12/21/22. She was admitted yesterday for afib.  Patient reports pain as mild to moderate.  Denies N/V/CP/SOB/Abd pain. She denies any tingling or numbness in LE bilaterally. She reports her pain as controlled.    Objective:   VITALS:   Vitals:   12/24/22 0419 12/24/22 0422 12/24/22 0812 12/24/22 0900  BP: (!) 168/70  (!) 161/90 (!) 173/99  Pulse: 77  (!) 130 (!) 107  Resp: 18  18 14  $ Temp: 98.3 F (36.8 C)  98.7 F (37.1 C)   TempSrc: Oral  Oral   SpO2:   96% 97%  Weight:  79 kg    Height:        Patient sitting up in bed. NAD.  Neurologically intact ABD soft Neurovascular intact Sensation intact distally Intact pulses distally Dorsiflexion/Plantar flexion intact No cellulitis present Compartment soft Ace bandage removed today. Aquacel dressing in place. C/D/I. Order placed for nurse to place TED stockings on LE bilaterally.   Lab Results  Component Value Date   WBC 10.4 12/24/2022   HGB 11.4 (L) 12/24/2022   HCT 31.9 (L) 12/24/2022   MCV 85.8 12/24/2022   PLT 174 12/24/2022   BMET    Component Value Date/Time   NA 127 (L) 12/24/2022 1008   NA 139 08/03/2016 1009   K 3.2 (L) 12/24/2022 1008   K 4.7 08/03/2016 1009   CL 91 (L) 12/24/2022 1008   CO2 25 12/24/2022 1008   CO2 27 08/03/2016 1009   GLUCOSE 100 (H) 12/24/2022 1008   GLUCOSE 77 08/03/2016 1009   BUN <5 (L) 12/24/2022 1008   BUN 15.6 08/03/2016 1009   CREATININE 0.51 12/24/2022 1008   CREATININE 0.7 08/03/2016 1009   CALCIUM 8.6 (L) 12/24/2022 1008   CALCIUM 9.7 08/03/2016 1009   EGFR 86 (L) 08/03/2016 1009   GFRNONAA >60 12/24/2022 1008     Assessment/Plan:     Principal Problem:   Hyponatremia Active Problems:   Rheumatoid arthritis ([place])   Hyperlipidemia   Hypokalemia   Leukocytosis   WBAT with walker DVT ppx:  Changed from aspirin to Eliquis due to Afib , TEDS PO pain control PT/OT: Patient to  mobilize out of bed with PT as able.  Dispo: - Patient under care of the medical team, disposition per their recommendation.   - Up with PT.  - Order for ice and TED stockings placed.    Charlott Rakes, PA-C 12/24/2022, 11:14 AM   [facility]  [facility] [place] [address], [address], [place], [place] [address] Phone: [phone] [email or web address] Facebook  [employer]

## 2022-12-25 ENCOUNTER — Other Ambulatory Visit: Payer: Self-pay | Admitting: Cardiology

## 2022-12-25 DIAGNOSIS — E871 Hypo-osmolality and hyponatremia: Secondary | ICD-10-CM | POA: Diagnosis not present

## 2022-12-25 DIAGNOSIS — I480 Paroxysmal atrial fibrillation: Secondary | ICD-10-CM | POA: Diagnosis not present

## 2022-12-25 DIAGNOSIS — E876 Hypokalemia: Secondary | ICD-10-CM | POA: Diagnosis not present

## 2022-12-25 DIAGNOSIS — M069 Rheumatoid arthritis, unspecified: Secondary | ICD-10-CM | POA: Diagnosis not present

## 2022-12-25 LAB — CORTISOL-AM, BLOOD: Cortisol - AM: 16 ug/dL (ref 6.7–22.6)

## 2022-12-25 LAB — BASIC METABOLIC PANEL
Anion gap: 9 (ref 5–15)
BUN: 7 mg/dL — ABNORMAL LOW (ref 8–23)
CO2: 27 mmol/L (ref 22–32)
Calcium: 8.3 mg/dL — ABNORMAL LOW (ref 8.9–10.3)
Chloride: 92 mmol/L — ABNORMAL LOW (ref 98–111)
Creatinine, Ser: 0.57 mg/dL (ref 0.44–1.00)
GFR, Estimated: >60 mL/min (ref >60)
Glucose, Bld: 120 mg/dL — ABNORMAL HIGH (ref 70–99)
Potassium: 3.2 mmol/L — ABNORMAL LOW (ref 3.5–5.1)
Sodium: 128 mmol/L — ABNORMAL LOW (ref 135–145)

## 2022-12-25 LAB — MAGNESIUM: Magnesium: 2.2 mg/dL (ref 1.7–2.4)

## 2022-12-25 MED ORDER — FUROSEMIDE 40 MG PO TABS
40.0000 mg | ORAL_TABLET | Freq: Every day | ORAL | Status: DC
  Administered 2022-12-25 – 2022-12-26 (×2): 40 mg via ORAL
  Filled 2022-12-25 (×2): qty 1

## 2022-12-25 MED ORDER — POTASSIUM CHLORIDE CRYS ER 20 MEQ PO TBCR
40.0000 meq | EXTENDED_RELEASE_TABLET | ORAL | Status: AC
  Administered 2022-12-25 (×2): 40 meq via ORAL
  Filled 2022-12-25 (×2): qty 2

## 2022-12-25 MED ORDER — MELATONIN 3 MG PO TABS
3.0000 mg | ORAL_TABLET | Freq: Once | ORAL | Status: AC | PRN
  Administered 2022-12-25: 3 mg via ORAL
  Filled 2022-12-25: qty 1

## 2022-12-25 NOTE — Progress Notes (Signed)
Mobility Specialist Progress Note   12/25/22 1615  Mobility  Activity Ambulated with assistance in hallway;Dangled on edge of bed  Level of Assistance Standby assist, set-up cues, supervision of patient - no hands on  Assistive Device Front wheel walker  Distance Ambulated (ft) 480 ft  Range of Motion/Exercises Active;All extremities  Activity Response Tolerated well   Patient received dangling EOB and agreeable to participate. Ambulated supervision level with steady gait. Returned to room without incident but complained of LLE knee pain second to TKA. Ice pack provided. Was left dangling EOB with all needs met, call bell in reach.   [place] Rayland Hamed, BS EXP Mobility Specialist Please contact via SecureChat or Rehab office at [phone]

## 2022-12-25 NOTE — Discharge Instructions (Signed)

## 2022-12-25 NOTE — Evaluation (Signed)
Occupational Therapy Evaluation Patient Details Name: Shelby Mcguire MRN: [id number] DOB: 1946-10-06 Today's Date: 12/25/2022   History of Present Illness Patient is 77 y.o. female admitted 2/16 with hyponatremia, discovered a-fib, hypokalemia. Recent  L TKA on 2/14. with PMH significant for but not limited to: HTN, PVD, RA, OA, R TKA (2021).   Clinical Impression   PTA, pt was independent; recently mod I with RW after recent L TKA. Upon eval, pt mod I with use of RW and up to min guard A without. Pt performing LB Adl with supervision and reports difficulty donning L sock. Pt with decreased strength, balance, and pain. Recommending follow physicians orders for discharge.      Recommendations for follow up therapy are one component of a multi-disciplinary discharge planning process, led by the attending physician.  Recommendations may be updated based on patient status, additional functional criteria and insurance authorization.   Follow Up Recommendations  Follow physician's recommendations for discharge plan and follow up therapies     Assistance Recommended at Discharge Set up Supervision/Assistance  Patient can return home with the following A little help with walking and/or transfers;A little help with bathing/dressing/bathroom;Assist for transportation;Help with stairs or ramp for entrance;Assistance with cooking/housework    Functional Status Assessment  Patient has had a recent decline in their functional status and demonstrates the ability to make significant improvements in function in a reasonable and predictable amount of time.  Equipment Recommendations  None recommended by OT    Recommendations for Other Services       Precautions / Restrictions Precautions Precautions: Fall;Knee Precaution Comments: Pt has handout, Reviewed precautions/positioning Restrictions Weight Bearing Restrictions: No Other Position/Activity Restrictions: LLE WBAT      Mobility Bed  Mobility               General bed mobility comments: In recliner    Transfers Overall transfer level: Modified independent Equipment used: Rolling walker (2 wheels), None Transfers: Sit to/from Stand Sit to Stand: Modified independent (Device/Increase time)           General transfer comment: Mod I, somewhat impulsive to rise, cues for taking time due to lines/ leads around chair. No LOB. performed with and without AD no assist needed.      Balance Overall balance assessment: Needs assistance Sitting-balance support: Feet supported Sitting balance-Leahy Scale: Normal     Standing balance support: No upper extremity supported, Reliant on assistive device for balance Standing balance-Leahy Scale: Fair                             ADL either performed or assessed with clinical judgement   ADL Overall ADL's : Needs assistance/impaired Eating/Feeding: Modified independent   Grooming: Modified independent;Standing   Upper Body Bathing: Modified independent   Lower Body Bathing: Modified independent;Sitting/lateral leans   Upper Body Dressing : Modified independent;Sitting   Lower Body Dressing: Supervision/safety;Sit to/from stand Lower Body Dressing Details (indicate cue type and reason): increased time and effort to don L sock Toilet Transfer: Modified Independent;Ambulation;Rolling walker (2 wheels)   Toileting- Clothing Manipulation and Hygiene: Modified independent;Sitting/lateral lean   Tub/ Shower Transfer: Modified independent;Ambulation;Rolling walker (2 wheels);Walk-in shower   Functional mobility during ADLs: Modified independent;Rolling walker (2 wheels) General ADL Comments: with RW, mod I; without RW, min guard to supervision     Vision Baseline Vision/History: 1 Wears glasses Ability to See in Adequate Light: 0 Adequate Patient Visual Report: No change from  baseline Vision Assessment?: No apparent visual deficits Additional  Comments: corrective lenses, using phone on arrival     Perception Perception Perception Tested?: No   Praxis Praxis Praxis tested?: Not tested    Pertinent Vitals/Pain Pain Assessment Pain Assessment: 0-10 Pain Score: 7  Pain Location: L knee Pain Descriptors / Indicators: Discomfort, Sore Pain Intervention(s): Limited activity within patient's tolerance, Monitored during session     Hand Dominance Right   Extremity/Trunk Assessment Upper Extremity Assessment Upper Extremity Assessment: Overall WFL for tasks assessed   Lower Extremity Assessment Lower Extremity Assessment: Defer to PT evaluation   Cervical / Trunk Assessment Cervical / Trunk Assessment: Normal   Communication Communication Communication: No difficulties   Cognition Arousal/Alertness: Awake/alert Behavior During Therapy: WFL for tasks assessed/performed Overall Cognitive Status: Within Functional Limits for tasks assessed                                       General Comments       Exercises     Shoulder Instructions      Home Living Family/patient expects to be discharged to:: Private residence Living Arrangements: Children Available Help at Discharge: Family Type of Home: House Home Access: Stairs to enter Technical brewer of Steps: 1 Entrance Stairs-Rails: None Home Layout: Multi-level;Other (Comment) (bed and bath on first floor)     Bathroom Shower/Tub: Occupational psychologist: Standard Bathroom Accessibility: Yes   Home Equipment: Conservation officer, nature (2 wheels);Shower seat;BSC/3in1          Prior Functioning/Environment Prior Level of Function : Independent/Modified Independent             Mobility Comments: Using RW since TKA, 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 ADLs Comments: Independent in ADL, IADL, working and driving before TKA        OT Problem List: Decreased strength;Decreased activity tolerance;Impaired balance (sitting  and/or standing);Decreased range of motion;Pain      OT Treatment/Interventions: Self-care/ADL training;Therapeutic exercise;DME and/or AE instruction;Balance training;Patient/family education;Therapeutic activities    OT Goals(Current goals can be found in the care plan section) Acute Rehab OT Goals Patient Stated Goal: go home OT Goal Formulation: With patient Time For Goal Achievement: 01/08/23 Potential to Achieve Goals: Good  OT Frequency: Min 2X/week    Co-evaluation              AM-PAC OT "6 Clicks" Daily Activity     Outcome Measure Help from another person eating meals?: None Help from another person taking care of personal grooming?: None Help from another person toileting, which includes using toliet, bedpan, or urinal?: None Help from another person bathing (including washing, rinsing, drying)?: None Help from another person to put on and taking off regular upper body clothing?: None Help from another person to put on and taking off regular lower body clothing?: A Little 6 Click Score: 23   End of Session Equipment Utilized During Treatment: Rolling walker (2 wheels);Gait belt Nurse Communication: Mobility status  Activity Tolerance: Patient tolerated treatment well Patient left: in chair;with call bell/phone within reach  OT Visit Diagnosis: Unsteadiness on feet (R26.81);Other abnormalities of gait and mobility (R26.89);Muscle weakness (generalized) (M62.81);Pain Pain - Right/Left: Left Pain - part of body: Knee                Time: [id number] OT Time Calculation (min): 17 min Charges:  OT General Charges $OT Visit: 1 Visit OT  Evaluation $OT Eval Low Complexity: 1 Low Elder Cyphers, OTR/L [facility] Acute Rehabilitation Office: [phone]   Magnus Ivan 12/25/2022, 1:02 PM

## 2022-12-25 NOTE — Progress Notes (Signed)
Subjective:  Feels better  Patient converted to sinus rhythm on 12/25/2022 at 00: 45.   Current Facility-Administered Medications:    acetaminophen (TYLENOL) tablet 650 mg, 650 mg, Oral, Q6H PRN, 650 mg at 12/25/22 0827 **OR** acetaminophen (TYLENOL) suppository 650 mg, 650 mg, Rectal, Q6H PRN, Marcelyn Bruins, MD   apixaban Arne Cleveland) tablet 5 mg, 5 mg, Oral, BID, Ghimire, Henreitta Leber, MD, 5 mg at 12/25/22 0827   diltiazem (CARDIZEM SR) 12 hr capsule 60 mg, 60 mg, Oral, Q12H, Lashane Whelpley J, MD, 60 mg at 12/25/22 [id number]   furosemide (LASIX) tablet 40 mg, 40 mg, Oral, Daily, Ghimire, Henreitta Leber, MD, 40 mg at 12/25/22 0827   hydrALAZINE (APRESOLINE) injection 2 mg, 2 mg, Intravenous, Q4H PRN, Marcelyn Bruins, MD   polyethylene glycol (MIRALAX / GLYCOLAX) packet 17 g, 17 g, Oral, Daily PRN, Marcelyn Bruins, MD, 17 g at 12/24/22 1629   polyvinyl alcohol (LIQUIFILM TEARS) 1.4 % ophthalmic solution 1 drop, 1 drop, Both Eyes, PRN, Marcelyn Bruins, MD   sodium chloride flush (NS) 0.9 % injection 3 mL, 3 mL, Intravenous, Q12H, Marcelyn Bruins, MD, 3 mL at 12/25/22 1328   traMADol (ULTRAM) tablet 50 mg, 50 mg, Oral, Q6H PRN, Marcelyn Bruins, MD, 50 mg at 12/24/22 2207   Objective:  Vital Signs in the last 24 hours: Temp:  [97.5 F (36.4 C)-98.4 F (36.9 C)] 97.9 F (36.6 C) (02/18 1200) Pulse Rate:  [74-108] 78 (02/18 1200) Resp:  [15-22] 22 (02/18 1200) BP: (108-167)/(60-72) 167/71 (02/18 1200) SpO2:  [92 %-98 %] 98 % (02/18 1200)  Intake/Output from previous day: 02/17 0701 - 02/18 0700 In: -  Out: 1450 [Urine:1450]  Physical Exam Vitals and nursing note reviewed.  Constitutional:      General: She is not in acute distress. Neck:     Vascular: No JVD.  Cardiovascular:     Rate and Rhythm: Normal rate and regular rhythm.     Heart sounds: Normal heart sounds. No murmur heard. Pulmonary:     Effort: Pulmonary effort is normal.     Breath sounds: Normal  breath sounds. No wheezing or rales.  Musculoskeletal:     Right lower leg: No edema.     Left lower leg: No edema.      Imaging/tests reviewed and independently interpreted: CBC, BMP, trop   CT head CTA chest   Cardiac Studies: Telemetry 12/25/2022:    Converted to sinus rhythm at 00:45 on 2/18. Total duration of afib around 18 hours.   Telemetry 12/24/2022:      Sinus rhythm to afib at 6:38 AM on 12/24/2022   EKG 12/24/2022: A-fib with RVR 120 to be primary Inferolateral ST depression, consider ischemia   Echocardiogram 12/24/2022:  1. Left ventricular ejection fraction, by estimation, is 65 to 70%. The  left ventricle has normal function. The left ventricle has no regional  wall motion abnormalities. Left ventricular diastolic parameters are  indeterminate.   2. Right ventricular systolic function is normal. The right ventricular  size is normal.   3. The mitral valve is abnormal. Trivial mitral valve regurgitation. No  evidence of mitral stenosis.   4. The aortic valve was not well visualized. There is moderate  calcification of the aortic valve. There is moderate thickening of the  aortic valve. Aortic valve regurgitation is mild. Aortic valve  sclerosis/calcification is present, without any evidence   of aortic stenosis.   5. The inferior vena cava is normal in size  with greater than 50%  respiratory variability, suggesting right atrial pressure of 3 mmHg.      Assessment & Recommendations:   77 y.o. Caucasian female  with hyperlipidemia, PAD, RA, admitted with acute metabolic encephalopathy likely due to severe hyponatremia, now improving.  Cardiology consulted for management of A-fib.     A-fib: Very likely new onset as of 12/24/2022 6: 38 AM, likely precipitated by metabolic encephalopathy and severe hyponatremia. Echocardiogram is essentially normal with normal EF, and no chamber enlargement, making longstanding paroxysmal A-fib less likely. It does appear  that patient felt symptoms of palpitations at the time of A-fib onset, but she is unsure if she has felt similar symptoms in the past.  Patient self converted to sinus rhythm on 12/25/2022 at 00: 45 hours, total duration of A-fib around 18 hours. CHA2DS2VASc score 3, annual stroke risk 3.6%. Given duration of <24 hours and potential precipitating causes for A-fib, we could consider discontinuing anticoagulation after 4 to 6 weeks. For now, continue Eliquis 5 mg twice daily. Recommend diltiazem 30 mg every 8 hours as needed on discharge. I will arrange outpatient cardiac telemetry monitor and follow-up.  On a separate note, on specific questioning, it appears that patient had endovenous vein ablation for varicose veins, and does not truly have peripheral artery disease.  Therefore, I will take this diagnosis of her chart.   Management of hyponatremia, underlying RA, as per primary team.   Discussed interpretation of tests and management recommendations with the primary team   Nigel Mormon, MD Pager: [phone] Office: [phone]

## 2022-12-25 NOTE — Progress Notes (Addendum)
PROGRESS NOTE        PATIENT DETAILS Name: Shelby Mcguire Age: 77 y.o. Sex: female Date of Birth: Jul 17, 1946 Admit Date: 12/23/2022 Admitting Physician Marcelyn Bruins, MD [id number], Ronie Spies, MD  Brief Summary: Patient is a 77 y.o.  female with a history of RA on methotrexate, PAD, HLD-who recently underwent left knee arthroplasty on 2/14-presented with nausea, weakness-found to have hyponatremia.  Significant events: 2/16>> admit to [facility] 2/17>> A-fib RVR  Significant studies: 2/16>> CTA chest: No PE/dissection 2/16>> CT head: No acute intracranial abnormality 2/17>> echo: EF 65-70%.  Significant microbiology data: None  Procedures: None  Consults: Orthopedics Cardiology  Subjective: No major issues overnight-sinus rhythm this morning.  Objective: Vitals: Blood pressure (!) 164/69, pulse 83, temperature 98.4 F (36.9 C), temperature source Oral, resp. rate 18, height 5' 3"$  (1.6 m), weight 79 kg, SpO2 96 %.   Exam: Gen Exam:Alert awake-not in any distress HEENT:atraumatic, normocephalic Chest: B/L clear to auscultation anteriorly CVS:S1S2 regular Abdomen:soft non tender, non distended Extremities:no edema Neurology: Non focal Skin: no rash  Pertinent Labs/Radiology:    Latest Ref Rng & Units 12/24/2022   12:53 AM 12/23/2022    9:55 AM 12/08/2022    9:34 AM  CBC  WBC 4.0 - 10.5 K/uL 10.4  13.5  6.9   Hemoglobin 12.0 - 15.0 g/dL 11.4  12.2  14.1   Hematocrit 36.0 - 46.0 % 31.9  32.7  41.5   Platelets 150 - 400 K/uL 174  221  239     Lab Results  Component Value Date   NA 128 (L) 12/25/2022   K 3.2 (L) 12/25/2022   CL 92 (L) 12/25/2022   CO2 27 12/25/2022     Assessment/Plan: Hyponatremia Felt to be multifactorial-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  Being on HCTZ/Lexapro obviously did not help. Sodium level stable overnight-fluid  restriction emphasized-starting furosemide today TSH/a.m. cortisol stable Continue to trend electrolytes.  Hypokalemia Continue to replete/recheck  PAF with RVR Spontaneously converted to sinus rhythm Continue Cardizem On Eliquis given CHA2DS2-VASc of 3  Echo stable Cardiology following.   S/p left knee arthroplasty on 2/14 Stop aspirin as patient will be on Eliquis Completed post surgical prophylaxis with Keflex 2/17 Orthopedics following PT/OT eval  HTN BP stable with Cardizem No plans to resume HCTZ given severity of hyponatremia.    RA Resume methotrexate postdischarge  Mood disorder Stable-Lexapro on hold due to hyponatremia  History of atypical ductal hyperplasia right breast Resume outpatient follow-up with oncology  Pulmonary nodule Seen incidentally on CTA chest-radiology recommending noncontrast CT in 4 to 6 months-will need to be done specially due to hyponatremia and concern for SIADH  Obesity: Estimated body mass index is 30.85 kg/m as calculated from the following:   Height as of this encounter: 5' 3"$  (1.6 m).   Weight as of this encounter: 79 kg.   Code status:   Code Status: DNR   DVT Prophylaxis: Place TED hose Start: 12/24/22 1122 apixaban (ELIQUIS) tablet 5 mg    Family Communication: Grandson-Javaris Green-[phone]-updated 2/17   Disposition Plan: Status is: Inpatient Remains inpatient appropriate because: severity of illness   Planned Discharge Destination:Home health   Diet: Diet Order             Diet NPO time specified  Diet effective  midnight           Diet Heart Fluid consistency: Thin; Fluid restriction: 1200 mL Fluid  Diet effective now                     Antimicrobial agents: Anti-infectives (From admission, onward)    Start     Dose/Rate Route Frequency Ordered Stop   12/23/22 1800  cephALEXin (KEFLEX) capsule 500 mg        500 mg Oral 4 times daily 12/23/22 1403 12/24/22 2110         MEDICATIONS: Scheduled Meds:  apixaban  5 mg Oral BID   diltiazem  60 mg Oral Q12H   furosemide  40 mg Oral Daily   potassium chloride  40 mEq Oral Q4H   sodium chloride flush  3 mL Intravenous Q12H   Continuous Infusions: PRN Meds:.acetaminophen **OR** acetaminophen, hydrALAZINE, polyethylene glycol, polyvinyl alcohol, traMADol   I have personally reviewed following labs and imaging studies  LABORATORY DATA: CBC: Recent Labs  Lab 12/23/22 0955 12/24/22 0053  WBC 13.5* 10.4  HGB 12.2 11.4*  HCT 32.7* 31.9*  MCV 84.7 85.8  PLT 221 174     Basic Metabolic Panel: Recent Labs  Lab 12/23/22 1502 12/23/22 1802 12/23/22 2023 12/24/22 0053 12/24/22 0552 12/24/22 1008 12/25/22 0402  NA 118*   < > 119* 123* 128* 127* 128*  K 3.3*   < > 3.7 3.6 3.3* 3.2* 3.2*  CL 80*   < > 84* 88* 93* 91* 92*  CO2 26   < > 24 24 26 25 27  $ GLUCOSE 117*   < > 96 95 93 100* 120*  BUN 6*   < > <5* 5* 6* <5* 7*  CREATININE 0.55   < > 0.54 0.57 0.59 0.51 0.57  CALCIUM 8.2*   < > 8.2* 8.4* 8.3* 8.6* 8.3*  MG 1.7  --   --   --   --   --  2.2   < > = values in this interval not displayed.     GFR: Estimated Creatinine Clearance: 59.5 mL/min (by C-G formula based on SCr of 0.57 mg/dL).  Liver Function Tests: Recent Labs  Lab 12/23/22 0955  AST 69*  ALT 35  ALKPHOS 46  BILITOT 1.2  PROT 6.1*  ALBUMIN 3.6    No results for input(s): "LIPASE", "AMYLASE" in the last 168 hours. Recent Labs  Lab 12/23/22 0955  AMMONIA 20     Coagulation Profile: No results for input(s): "INR", "PROTIME" in the last 168 hours.  Cardiac Enzymes: No results for input(s): "CKTOTAL", "CKMB", "CKMBINDEX", "TROPONINI" in the last 168 hours.  BNP (last 3 results) No results for input(s): "PROBNP" in the last 8760 hours.  Lipid Profile: No results for input(s): "CHOL", "HDL", "LDLCALC", "TRIG", "CHOLHDL", "LDLDIRECT" in the last 72 hours.  Thyroid Function Tests: Recent Labs     12/23/22 0955  TSH 0.843     Anemia Panel: No results for input(s): "VITAMINB12", "FOLATE", "FERRITIN", "TIBC", "IRON", "RETICCTPCT" in the last 72 hours.  Urine analysis:    Component Value Date/Time   COLORURINE YELLOW 12/23/2022 1316   APPEARANCEUR CLEAR 12/23/2022 1316   LABSPEC 1.028 12/23/2022 1316   PHURINE 7.0 12/23/2022 1316   GLUCOSEU NEGATIVE 12/23/2022 1316   HGBUR SMALL (A) 12/23/2022 1316   BILIRUBINUR NEGATIVE 12/23/2022 1316   KETONESUR 20 (A) 12/23/2022 1316   PROTEINUR 30 (A) 12/23/2022 1316   NITRITE NEGATIVE 12/23/2022 1316   LEUKOCYTESUR NEGATIVE  12/23/2022 1316    Sepsis Labs: Lactic Acid, Venous No results found for: "LATICACIDVEN"  MICROBIOLOGY: No results found for this or any previous visit (from the past 240 hour(s)).  RADIOLOGY STUDIES/RESULTS: ECHOCARDIOGRAM COMPLETE  Result Date: 12/24/2022    ECHOCARDIOGRAM REPORT   Patient Name:   Shelby Mcguire Date of Exam: 12/24/2022 Medical Rec #:  [id number]         Height:       63.0 in Accession #:    [id number]        Weight:       174.2 lb Date of Birth:  Mar 25, 1946        BSA:          1.823 m Patient Age:    22 years          BP:           168/70 mmHg Patient Gender: F                 HR:           110 bpm. Exam Location:  Inpatient Procedure: 2D Echo, Color Doppler and Cardiac Doppler Indications:    CHF  History:        Patient has no prior history of Echocardiogram examinations.                 Risk Factors:Dyslipidemia. Varicose veins of bilateral lower                 extremities with other complications.  Sonographer:    Ronny Flurry Referring Phys: [id number] [place]  1. Left ventricular ejection fraction, by estimation, is 65 to 70%. The left ventricle has normal function. The left ventricle has no regional wall motion abnormalities. Left ventricular diastolic parameters are indeterminate.  2. Right ventricular systolic function is normal. The right ventricular size is  normal.  3. The mitral valve is abnormal. Trivial mitral valve regurgitation. No evidence of mitral stenosis.  4. The aortic valve was not well visualized. There is moderate calcification of the aortic valve. There is moderate thickening of the aortic valve. Aortic valve regurgitation is mild. Aortic valve sclerosis/calcification is present, without any evidence  of aortic stenosis.  5. The inferior vena cava is normal in size with greater than 50% respiratory variability, suggesting right atrial pressure of 3 mmHg. FINDINGS  Left Ventricle: Left ventricular ejection fraction, by estimation, is 65 to 70%. The left ventricle has normal function. The left ventricle has no regional wall motion abnormalities. The left ventricular internal cavity size was normal in size. There is  no left ventricular hypertrophy. Left ventricular diastolic parameters are indeterminate. Right Ventricle: The right ventricular size is normal. No increase in right ventricular wall thickness. Right ventricular systolic function is normal. Left Atrium: Left atrial size was normal in size. Right Atrium: Right atrial size was normal in size. Pericardium: There is no evidence of pericardial effusion. Mitral Valve: The mitral valve is abnormal. There is mild thickening of the mitral valve leaflet(s). Mild mitral annular calcification. Trivial mitral valve regurgitation. No evidence of mitral valve stenosis. Tricuspid Valve: The tricuspid valve is normal in structure. Tricuspid valve regurgitation is not demonstrated. No evidence of tricuspid stenosis. Aortic Valve: The aortic valve was not well visualized. There is moderate calcification of the aortic valve. There is moderate thickening of the aortic valve. Aortic valve regurgitation is mild. Aortic regurgitation PHT measures 329 msec. Aortic valve sclerosis/calcification is present, without any evidence  of aortic stenosis. Aortic valve mean gradient measures 5.5 mmHg. Aortic valve peak gradient  measures 10.3 mmHg. Aortic valve area, by VTI measures 2.65 cm. Pulmonic Valve: The pulmonic valve was normal in structure. Pulmonic valve regurgitation is not visualized. No evidence of pulmonic stenosis. Aorta: The aortic root is normal in size and structure. Venous: The inferior vena cava is normal in size with greater than 50% respiratory variability, suggesting right atrial pressure of 3 mmHg. IAS/Shunts: No atrial level shunt detected by color flow Doppler.  LEFT VENTRICLE PLAX 2D LVIDd:         4.40 cm LVIDs:         3.00 cm LV PW:         1.00 cm LV IVS:        0.90 cm LVOT diam:     2.10 cm LV SV:         68 LV SV Index:   38 LVOT Area:     3.46 cm  RIGHT VENTRICLE             IVC RV S prime:     12.70 cm/s  IVC diam: 0.90 cm LEFT ATRIUM             Index        RIGHT ATRIUM           Index LA diam:        3.30 cm 1.81 cm/m   RA Area:     15.60 cm LA Vol (A2C):   35.8 ml 19.64 ml/m  RA Volume:   33.40 ml  18.32 ml/m LA Vol (A4C):   26.5 ml 14.53 ml/m LA Biplane Vol: 31.3 ml 17.17 ml/m  AORTIC VALVE AV Area (Vmax):    2.67 cm AV Area (Vmean):   2.47 cm AV Area (VTI):     2.65 cm AV Vmax:           160.50 cm/s AV Vmean:          107.700 cm/s AV VTI:            0.258 m AV Peak Grad:      10.3 mmHg AV Mean Grad:      5.5 mmHg LVOT Vmax:         123.67 cm/s LVOT Vmean:        76.900 cm/s LVOT VTI:          0.198 m LVOT/AV VTI ratio: 0.77 AI PHT:            329 msec  AORTA Ao Root diam: 3.30 cm Ao Asc diam:  3.20 cm MV E velocity: 111.00 cm/s                             SHUNTS                             Systemic VTI:  0.20 m                             Systemic Diam: 2.10 cm Jenkins Rouge MD Electronically signed by Jenkins Rouge MD Signature Date/Time: 12/24/2022/10:50:28 AM    Final    CT Head Wo Contrast  Result Date: 12/23/2022 CLINICAL DATA:  Dizziness. EXAM: CT HEAD WITHOUT CONTRAST TECHNIQUE: Contiguous axial images were obtained from the base of the skull through  the vertex without  intravenous contrast. RADIATION DOSE REDUCTION: This exam was performed according to the departmental dose-optimization program which includes automated exposure control, adjustment of the mA and/or kV according to patient size and/or use of iterative reconstruction technique. COMPARISON:  CT head venogram dated September 25, 2022. FINDINGS: Brain: No evidence of acute infarction, hemorrhage, hydrocephalus, extra-axial collection or mass lesion/mass effect. Vascular: Atherosclerotic vascular calcification of the carotid siphons. No hyperdense vessel. Skull: Normal. Negative for fracture or focal lesion. Sinuses/Orbits: No acute finding.  Bilateral globe colobomas. Other: None. IMPRESSION: 1. No acute intracranial abnormality. Electronically Signed   By: Titus Dubin M.D.   On: 12/23/2022 12:35   CT Angio Chest PE W/Cm &/Or Wo Cm  Result Date: 12/23/2022 CLINICAL DATA:  Heartburn, nausea and dizziness since knee replacement surgery EXAM: CT ANGIOGRAPHY CHEST WITH CONTRAST TECHNIQUE: Multidetector CT imaging of the chest was performed using the standard protocol during bolus administration of intravenous contrast. Multiplanar CT image reconstructions and MIPs were obtained to evaluate the vascular anatomy. RADIATION DOSE REDUCTION: This exam was performed according to the departmental dose-optimization program which includes automated exposure control, adjustment of the mA and/or kV according to patient size and/or use of iterative reconstruction technique. CONTRAST:  84m OMNIPAQUE IOHEXOL 350 MG/ML SOLN COMPARISON:  Chest x-ray, same date. FINDINGS: Cardiovascular: The heart is normal in size. No pericardial effusion. The aorta is within normal limits in caliber. No dissection. Scattered atherosclerotic calcification. Scattered three-vessel coronary artery calcifications. The pulmonary arterial tree is well opacified. No filling defects to suggest pulmonary embolism. Mediastinum/Nodes: Small scattered  mediastinal and hilar lymph nodes but no mass or overt adenopathy. The esophagus is grossly normal. Lungs/Pleura: Underlying emphysematous changes and pulmonary scarring. Dependent subpleural atelectasis and very small bilateral pleural effusions. Elongated bilobed right lower lobe pulmonary nodule versus 2 adjacent nodules. Maximum measurement is 12 mm. Indeterminate finding. Recommend follow-up noncontrast chest CT in 4-6 months to reassess. No other pulmonary nodules or pulmonary lesions. The central tracheobronchial tree is unremarkable. Upper Abdomen: No significant upper abdominal findings. Musculoskeletal: No significant bony findings. Review of the MIP images confirms the above findings. IMPRESSION: 1. No CT findings for pulmonary embolism. 2. Normal caliber thoracic aorta.  No dissection. 3. Scattered three-vessel coronary artery calcifications. 4. Dependent subpleural atelectasis and very small bilateral pleural effusions. 5. Elongated bilobed right lower lobe pulmonary nodule versus 2 adjacent nodules. Recommend follow-up noncontrast chest CT in 4-6 months to reassess. Emphysema (ICD10-J43.9). Electronically Signed   By: PMarijo SanesM.D.   On: 12/23/2022 12:34     LOS: 2 days   SOren Binet MD  [facility]    To contact the attending provider between 7A-7P or the covering provider during after hours 7P-7A, please log into the web site [email or web address] and access using universal [place] password for that web site. If you do not have the password, please call the hospital operator.  12/25/2022, 10:16 AM

## 2022-12-25 NOTE — Progress Notes (Signed)
Subjective:   Procedure(s) (LRB): TRANSESOPHAGEAL ECHOCARDIOGRAM (TEE) (N/A) CARDIOVERSION (N/A) Patient reports pain as mild.   Reports feeling better today  Was in her room doing her exercises  Objective: Vital signs in last 24 hours: Temp:  [97.5 F (36.4 C)-98.4 F (36.9 C)] 98.4 F (36.9 C) (02/18 0819) Pulse Rate:  [74-108] 83 (02/18 0819) Resp:  [15-21] 18 (02/18 0819) BP: (108-164)/(60-74) 164/69 (02/18 0819) SpO2:  [92 %-96 %] 96 % (02/18 0819)  Intake/Output from previous day: 02/17 0701 - 02/18 0700 In: -  Out: 1450 [Urine:1450] Intake/Output this shift: No intake/output data recorded.  Recent Labs    12/23/22 0955 12/24/22 0053  HGB 12.2 11.4*   Recent Labs    12/23/22 0955 12/24/22 0053  WBC 13.5* 10.4  RBC 3.86* 3.72*  HCT 32.7* 31.9*  PLT 221 174   Recent Labs    12/24/22 1008 12/25/22 0402  NA 127* 128*  K 3.2* 3.2*  CL 91* 92*  CO2 25 27  BUN <5* 7*  CREATININE 0.51 0.57  GLUCOSE 100* 120*  CALCIUM 8.6* 8.3*   No results for input(s): "LABPT", "INR" in the last 72 hours.  Neurologically intact Neurovascular intact Sensation intact distally Intact pulses distally Dorsiflexion/Plantar flexion intact Incision: dressing C/D/I Compartment soft   Assessment/Plan:   Procedure(s) (LRB): TRANSESOPHAGEAL ECHOCARDIOGRAM (TEE) (N/A) CARDIOVERSION (N/A) Up with therapy continue working with her. She will follow up in our office in 2 weeks Leave dressings on and intact until follow up appointment OPPT already scheduled for patient      Derrick Ravel  [id number] 12/25/2022, 9:46 AM

## 2022-12-26 ENCOUNTER — Telehealth: Payer: Self-pay

## 2022-12-26 ENCOUNTER — Other Ambulatory Visit (HOSPITAL_COMMUNITY): Payer: Self-pay

## 2022-12-26 DIAGNOSIS — E871 Hypo-osmolality and hyponatremia: Secondary | ICD-10-CM | POA: Diagnosis not present

## 2022-12-26 DIAGNOSIS — E876 Hypokalemia: Secondary | ICD-10-CM | POA: Diagnosis not present

## 2022-12-26 DIAGNOSIS — E785 Hyperlipidemia, unspecified: Secondary | ICD-10-CM | POA: Diagnosis not present

## 2022-12-26 DIAGNOSIS — I480 Paroxysmal atrial fibrillation: Secondary | ICD-10-CM | POA: Diagnosis not present

## 2022-12-26 LAB — BASIC METABOLIC PANEL
Anion gap: 11 (ref 5–15)
BUN: 8 mg/dL (ref 8–23)
CO2: 24 mmol/L (ref 22–32)
Calcium: 8.6 mg/dL — ABNORMAL LOW (ref 8.9–10.3)
Chloride: 97 mmol/L — ABNORMAL LOW (ref 98–111)
Creatinine, Ser: 0.49 mg/dL (ref 0.44–1.00)
GFR, Estimated: >60 mL/min (ref >60)
Glucose, Bld: 108 mg/dL — ABNORMAL HIGH (ref 70–99)
Potassium: 4.1 mmol/L (ref 3.5–5.1)
Sodium: 132 mmol/L — ABNORMAL LOW (ref 135–145)

## 2022-12-26 SURGERY — ECHOCARDIOGRAM, TRANSESOPHAGEAL
Anesthesia: Monitor Anesthesia Care

## 2022-12-26 MED ORDER — DILTIAZEM HCL ER 60 MG PO CP12
60.0000 mg | ORAL_CAPSULE | Freq: Two times a day (BID) | ORAL | 2 refills | Status: AC
  Filled 2022-12-26: qty 60, 30d supply, fill #0

## 2022-12-26 MED ORDER — FUROSEMIDE 40 MG PO TABS
40.0000 mg | ORAL_TABLET | Freq: Every day | ORAL | 2 refills | Status: AC
  Filled 2022-12-26: qty 30, 30d supply, fill #0

## 2022-12-26 MED ORDER — APIXABAN 5 MG PO TABS
5.0000 mg | ORAL_TABLET | Freq: Two times a day (BID) | ORAL | 2 refills | Status: DC
  Filled 2022-12-26: qty 60, 30d supply, fill #0

## 2022-12-26 MED ORDER — POTASSIUM CHLORIDE CRYS ER 10 MEQ PO TBCR
10.0000 meq | EXTENDED_RELEASE_TABLET | Freq: Every day | ORAL | 1 refills | Status: AC
  Filled 2022-12-26: qty 30, 30d supply, fill #0

## 2022-12-26 MED ORDER — DILTIAZEM HCL 30 MG PO TABS
30.0000 mg | ORAL_TABLET | Freq: Three times a day (TID) | ORAL | 0 refills | Status: DC | PRN
  Filled 2022-12-26: qty 30, 10d supply, fill #0

## 2022-12-26 NOTE — Care Management Important Message (Signed)
Important Message  Patient Details  Name: Shelby Mcguire MRN: [id number] Date of Birth: [birth year]   Medicare Important Message Given:  Yes Patient left prior to IM delivery will mail to the patient home address.     [name] 12/26/2022, 2:02 PM

## 2022-12-26 NOTE — Telephone Encounter (Signed)
Location of hospitalization: [place] Reason for hospitalization: confuse  Date of discharge: 12/26/2022 Date of first communication with patient: today Person contacting patient: Me Current symptoms: NA Do you understand why you were in the Hospital: Yes Questions regarding discharge instructions: None Where were you discharged to: Home Medications reviewed: Yes Allergies reviewed: Yes Dietary changes reviewed: Yes. Discussed low fat and low salt diet.  Referals reviewed: NA Activities of Daily Living: Able to with mild limitations Any transportation issues/concerns: None Any patient concerns: None Confirmed importance & date/time of Follow up appt: Yes Confirmed with patient if condition begins to worsen call. Pt was given the office number and encouraged to call back with questions or concerns: Yes

## 2022-12-26 NOTE — TOC Transition Note (Signed)
Transition of Care [facility]) - CM/SW Discharge Note   Patient Details  Name: Shelby Mcguire MRN: [id number] Date of Birth: [birth year]  [facility] CM/SW Contact:  Levonne Lapping, RN Phone Number: 12/26/2022, 9:54 AM   Clinical Narrative:     Patient will DC to home- Daughter to transport. Patient will need to leave in time to attend her scheduled OPPT appointment in [place]. RN will have her ready and patient is very Patent attorney. No additional TOC needs            Patient Goals and CMS Choice      Discharge Placement                         Discharge Plan and Services Additional resources added to the After Visit Summary for                                       Social Determinants of Health (SDOH) Interventions SDOH Screenings   Tobacco Use: Medium Risk (12/23/2022)     Readmission Risk Interventions     No data to display

## 2022-12-26 NOTE — Progress Notes (Signed)
Physical Therapy Treatment Patient Details Name: Shelby Mcguire MRN: [id number] DOB: 31-Aug-1946 Today's Date: 12/26/2022   History of Present Illness Patient is 77 y.o. female admitted 2/16 with hyponatremia, discovered a-fib, hypokalemia. Recent  L TKA on 2/14. with PMH significant for but not limited to: HTN, PVD, RA, OA, R TKA (2021).    PT Comments    Pt tolerated today's session well, ambulating in the hallway with improved gait speed, very minimal angtalgic gait with RW and supervision for safety and line management. Reviewed seated and standing TherEx with pt, provided verbal and visual cueing for proper posture and hip positioning for seated heel slides, both AROM and AAROM, pt correcting with cueing and verbalizes understanding to allow maximal knee flexion. Pt continues to progress well and acute PT will follow up with pt during admission to progress mobility and strengthening/ROM for her L knee. Pt declined stair trial but reports no concerns upon discharge, recommendations remain the same.    Recommendations for follow up therapy are one component of a multi-disciplinary discharge planning process, led by the attending physician.  Recommendations may be updated based on patient status, additional functional criteria and insurance authorization.  Follow Up Recommendations  Follow physician's recommendations for discharge plan and follow up therapies (Return to outpatient orthopedic physical therapy for Lt total knee arthroplasty.)     Assistance Recommended at Discharge Set up Supervision/Assistance  Patient can return home with the following A little help with bathing/dressing/bathroom;Assistance with cooking/housework;Assist for transportation;Help with stairs or ramp for entrance   Equipment Recommendations  None recommended by PT    Recommendations for Other Services       Precautions / Restrictions Precautions Precautions: Fall;Knee Precaution Comments: Pt has  handout, Reviewed precautions/positioning Restrictions Weight Bearing Restrictions: No Other Position/Activity Restrictions: LLE WBAT     Mobility  Bed Mobility Overal bed mobility: Needs Assistance Bed Mobility: Supine to Sit     Supine to sit: Supervision     General bed mobility comments: pt left seated EOB at end of session to get ready for D/C    Transfers Overall transfer level: Modified independent Equipment used: Rolling walker (2 wheels), None Transfers: Sit to/from Stand Sit to Stand: Modified independent (Device/Increase time)           General transfer comment: Mod I, somewhat impulsive to rise, performing with and without AD. No LOB noted. Performing from bed, chair without armrests, and toilet    Ambulation/Gait Ambulation/Gait assistance: Supervision Gait Distance (Feet): 300 Feet Assistive device: Rolling walker (2 wheels) Gait Pattern/deviations: Step-through pattern, Antalgic Gait velocity: minimally decreased     General Gait Details: Minimally antalgic noted more when attempting to ambulate without RW, cued for forward gaze   Stairs             Wheelchair Mobility    Modified Rankin (Stroke Patients Only)       Balance Overall balance assessment: Needs assistance Sitting-balance support: Feet supported Sitting balance-Leahy Scale: Normal     Standing balance support: During functional activity, Reliant on assistive device for balance Standing balance-Leahy Scale: Fair Standing balance comment: able to stand without use of RW but utilized during standing exercises and ambulation                            Cognition Arousal/Alertness: Awake/alert Behavior During Therapy: WFL for tasks assessed/performed Overall Cognitive Status: Within Functional Limits for tasks assessed  General Comments: pleasant throughout session        Exercises General Exercises - Lower  Extremity Long Arc Quad: AROM, Left, 10 reps, Seated (holding ~2 secs) Heel Slides: AROM, AAROM, Right, 10 reps, Seated (1 set AROM, 1 set AAROM with RLE, holding in maximum flexion ~5 secs) Hip Flexion/Marching: AROM, Both, 15 reps, Standing Heel Raises: AROM, Both, 10 reps, Standing Other Exercises Other Exercises: Standing hamstring curl on LLE AROM for strengthing x10 reps Other Exercises: Standing gastroc stretch for LLE holding x1 minute    General Comments General comments (skin integrity, edema, etc.): HR in 70-90s with mobility and exercises      Pertinent Vitals/Pain Pain Assessment Pain Assessment: 0-10 Pain Score: 6  Pain Location: L knee Pain Descriptors / Indicators: Discomfort, Sore Pain Intervention(s): Monitored during session, Limited activity within patient's tolerance    Home Living                          Prior Function            PT Goals (current goals can now be found in the care plan section) Acute Rehab PT Goals Patient Stated Goal: Return to OPPT PT Goal Formulation: With patient Time For Goal Achievement: 01/07/23 Potential to Achieve Goals: Good Progress towards PT goals: Progressing toward goals    Frequency    Min 3X/week      PT Plan Current plan remains appropriate    Co-evaluation              AM-PAC PT "6 Clicks" Mobility   Outcome Measure  Help needed turning from your back to your side while in a flat bed without using bedrails?: None Help needed moving from lying on your back to sitting on the side of a flat bed without using bedrails?: None Help needed moving to and from a bed to a chair (including a wheelchair)?: None Help needed standing up from a chair using your arms (e.g., wheelchair or bedside chair)?: None Help needed to walk in hospital room?: A Little Help needed climbing 3-5 steps with a railing? : A Little 6 Click Score: 22    End of Session   Activity Tolerance: Patient tolerated treatment  well Patient left: with call bell/phone within reach;in bed (sitting EOB) Nurse Communication: Mobility status PT Visit Diagnosis: Unsteadiness on feet (R26.81);Other abnormalities of gait and mobility (R26.89);Muscle weakness (generalized) (M62.81);Difficulty in walking, not elsewhere classified (R26.2);Pain Pain - Right/Left: Left Pain - part of body: Knee     Time: 0930-1008 PT Time Calculation (min) (ACUTE ONLY): 38 min  Charges:  $Gait Training: 8-22 mins $Therapeutic Exercise: 8-22 mins $Therapeutic Activity: 8-22 mins                     Charlynne Cousins, PT DPT Acute Rehabilitation Services Office [phone]    Luvenia Heller 12/26/2022, 12:00 PM

## 2022-12-26 NOTE — TOC Benefit Eligibility Note (Signed)
Patient Teacher, English as a foreign language completed.    The patient is currently admitted and upon discharge could be taking Eliquis 5 mg.  The current 30 day co-pay is $20.00.   The patient is insured through [employer], [employer] [facility] [place] Patient Advocate Team Direct Number: [phone]  Fax: [phone]

## 2022-12-26 NOTE — Discharge Summary (Addendum)
PATIENT DETAILS Name: Shelby Mcguire Age: 77 y.o. Sex: female Date of Birth: 21-Feb-1946 MRN: [id number]. Admitting Physician: Marcelyn Bruins, MD [id number], Ronie Spies, MD  Admit Date: 12/23/2022 Discharge date: 12/26/2022  Recommendations for Outpatient Follow-up:  Follow up with PCP in 1-2 weeks Please obtain CMP/CBC in one week Repeat CT chest 4-6 months-incidental finding of pulmonary nodule.  Due to suspicion for SIADH causing hyponatremia-this will need to be closely monitored for malignancy.  Admitted From:  Home  Disposition: Home   Discharge Condition: good  CODE STATUS:   Code Status: DNR   Diet recommendation:  Diet Order             Diet 2 gram sodium Room service appropriate? Yes; Fluid consistency: Thin; Fluid restriction: 1200 mL Fluid  Diet effective now           Diet - low sodium heart healthy                    Brief Summary: Patient is a 77 y.o.  female with a history of RA on methotrexate, PAD, HLD-who recently underwent left knee arthroplasty on 2/14-presented with nausea, weakness-found to have hyponatremia.   Significant events: 2/16>> admit to [facility] 2/17>> A-fib RVR 2/18>>back to sinus rhythm   Significant studies: 2/16>> CTA chest: No PE/dissection 2/16>> CT head: No acute intracranial abnormality 2/17>> echo: EF 65-70%.   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  Being on HCTZ/Lexapro obviously did not help. Managed with supportive care-did get 1 injection of 3% saline 50 cc in the emergency room, following that patient was gently hydrated with 0.9 NS.  Sodium levels gradually improved-fluid restriction/diuretics were started-with these measures-if sodium levels have now improved to 132.   emphasized-starting furosemide today TSH/a.m. cortisol stable No plans to resume HCTZ/Lexapro on discharge Continue fluid restriction and furosemide on discharge.   Hypokalemia Continue to repeated   PAF with RVR Spontaneously converted to sinus rhythm Continue Cardizem On Eliquis given CHA2DS2-VASc of 3  Echo stable Cardiology followed closely.   S/p left knee arthroplasty on 2/14 Stop aspirin as patient will be on Eliquis Completed post surgical prophylaxis with Keflex 2/17 Orthopedics following PT/OT eval completed-recommendations are to continue   HTN BP stable with Cardizem and furosemide. No plans to resume HCTZ given severity of hyponatremia.     RA Resume methotrexate postdischarge   Mood disorder Stable-Lexapro on hold due to hyponatremia-patient has been asked to discuss with PCP regarding possible alternative medication.   History of atypical ductal hyperplasia right breast Resume outpatient follow-up with oncology   Pulmonary nodule Seen incidentally on CTA chest-radiology recommending noncontrast CT in 4 to 6 months-will need to be done specially due to hyponatremia and concern for SIADH   Obesity: Estimated body mass index is 30.85 kg/m as calculated from the following:   Height as of this encounter: 5' 3"$  (1.6 m).   Weight as of this encounter: 79 kg.    Discharge Diagnoses:  Principal Problem:   Hyponatremia Active Problems:   Rheumatoid arthritis (HCC)   Hyperlipidemia   Hypokalemia   Leukocytosis   Paroxysmal A-fib [facility])   Discharge Instructions:  Activity:  WBAT with walker.  Discharge Instructions     Call MD for:  difficulty breathing, headache or visual disturbances   Complete by:  As directed    Call MD for:  redness, tenderness, or signs of infection (pain, swelling, redness, odor or green/yellow discharge around incision site)   Complete by: As directed    Diet - low sodium heart healthy   Complete by: As directed     Discharge instructions   Complete by: As directed    Follow with Primary MD  Leeroy Cha, MD in 1-2 weeks  Continue with 1500 cc daily fluid restriction  Please ask your primary care practitioner to repeat a CT scan of the chest in 4 to 6 months-you incidentally were found to have a small lung nodule.  In some cases-these nodules can turn cancerous.  As we discussed-continue to hold HCTZ and Lexapro.  Please talk to your primary care practitioner about alternatives to Lexapro.  Instead of HCTZ-we have switched you to furosemide.  Please follow-up with orthopedics as previously scheduled.  Please get a complete blood count and chemistry panel checked by your Primary MD at your next visit, and again as instructed by your Primary MD.  Get Medicines reviewed and adjusted: Please take all your medications with you for your next visit with your Primary MD  Laboratory/radiological data: Please request your Primary MD to go over all hospital tests and procedure/radiological results at the follow up, please ask your Primary MD to get all Hospital records sent to his/her office.  In some cases, they will be blood work, cultures and biopsy results pending at the time of your discharge. Please request that your primary care M.D. follows up on these results.  Also Note the following: If you experience worsening of your admission symptoms, develop shortness of breath, life threatening emergency, suicidal or homicidal thoughts you must seek medical attention immediately by calling 911 or calling your MD immediately  if symptoms less severe.  You must read complete instructions/literature along with all the possible adverse reactions/side effects for all the Medicines you take and that have been prescribed to you. Take any new Medicines after you have completely understood and accpet all the possible adverse reactions/side effects.   Do not drive when taking Pain medications or sleeping  medications (Benzodaizepines)  Do not take more than prescribed Pain, Sleep and Anxiety Medications. It is not advisable to combine anxiety,sleep and pain medications without talking with your primary care practitioner  Special Instructions: If you have smoked or chewed Tobacco  in the last 2 yrs please stop smoking, stop any regular Alcohol  and or any Recreational drug use.  Wear Seat belts while driving.  Please note: You were cared for by a hospitalist during your hospital stay. Once you are discharged, your primary care physician will handle any further medical issues. Please note that NO REFILLS for any discharge medications will be authorized once you are discharged, as it is imperative that you return to your primary care physician (or establish a relationship with a primary care physician if you do not have one) for your post hospital discharge needs so that they can reassess your need for medications and monitor your lab values.   Increase activity slowly   Complete by: As directed       Allergies as of 12/26/2022       Reactions   Penicillins Other (See Comments)   Told to avoid while taking methotrexate   Red Blood Cells Other (See Comments)   Jehovah's Witness - no blood products        Medication List     STOP taking  these medications    aspirin EC 81 MG tablet   cephALEXin 500 MG capsule Commonly known as: KEFLEX   Dilt-XR 240 MG 24 hr capsule Generic drug: diltiazem Replaced by: diltiazem 60 MG 12 hr capsule   escitalopram 10 MG tablet Commonly known as: LEXAPRO   hydrochlorothiazide 25 MG tablet Commonly known as: HYDRODIURIL   Potassium 99 MG Tabs   QC TUMERIC COMPLEX PO       TAKE these medications    acetaminophen 650 MG CR tablet Commonly known as: TYLENOL Take 1,300 mg by mouth daily as needed for pain.   apixaban 5 MG Tabs tablet Commonly known as: ELIQUIS Take 1 tablet (5 mg total) by mouth 2 (two) times daily.   ASCORBIC ACID  PO Take 1 tablet by mouth daily.   BIOTIN PO Take 1 tablet by mouth daily.   CALCIUM + VITAMIN D3 PO Take 1 tablet by mouth 2 (two) times daily.   diltiazem 60 MG 12 hr capsule Commonly known as: CARDIZEM SR Take 1 capsule (60 mg total) by mouth every 12 (twelve) hours. Replaces: Dilt-XR 240 MG 24 hr capsule   diltiazem 30 MG tablet Commonly known as: Cardizem Take 1 tablet (30 mg total) by mouth 3 (three) times daily as needed (tachycardia).   FISH OIL PO Take 1 capsule by mouth daily.   folic acid 1 MG tablet Commonly known as: FOLVITE Take 1 mg by mouth 3 (three) times daily.   furosemide 40 MG tablet Commonly known as: LASIX Take 1 tablet (40 mg total) by mouth daily. Start taking on: December 27, 2022   L-LYSINE PO Take 1 tablet by mouth daily.   methocarbamol 500 MG tablet Commonly known as: ROBAXIN Take 1 tablet (500 mg total) by mouth 4 (four) times daily.   methotrexate 2.5 MG tablet Commonly known as: RHEUMATREX Take 8 tablets (20 mg total) by mouth once a week. Caution: Chemotherapy. Protect from light. What changed:  when to take this additional instructions   MILK THISTLE EXTRACT PO Take 1 tablet by mouth 3 (three) times daily.   omeprazole 20 MG capsule Commonly known as: PRILOSEC Take 20 mg by mouth daily as needed (heart burn).   ondansetron 4 MG tablet Commonly known as: Zofran Take 1 tablet (4 mg total) by mouth daily as needed for nausea or vomiting.   oxyCODONE 5 MG immediate release tablet Commonly known as: Roxicodone Take 1 tablet (5 mg total) by mouth every 4 (four) hours as needed for severe pain.   potassium chloride 10 MEQ tablet Commonly known as: KLOR-CON M Take 1 tablet (10 mEq total) by mouth daily.   predniSONE 10 MG tablet Commonly known as: DELTASONE Take 10 mg by mouth daily as needed (RA flare).   RED YEAST RICE PO Take 1 tablet by mouth 2 (two) times daily.   traMADol 50 MG tablet Commonly known as:  Ultram Take 1 tablet (50 mg total) by mouth every 6 (six) hours as needed. What changed: reasons to take this   VITAMIN D-3 PO Take 1 capsule by mouth daily.        Follow-up Information     Leeroy Cha, MD. Schedule an appointment as soon as possible for a visit in 1 week(s).   Specialty: Internal Medicine Contact information: [address] [place] [address] [phone]         Nigel Mormon, MD Follow up.   Specialties: Cardiology, Radiology Why: Hospital follow up, Office  will call with date/time, If you dont hear from them,please give them a call Contact information: [place] [address] [phone]         Sydnee Cabal, MD Follow up.   Specialty: Orthopedic Surgery Why: follow up as previously scheduled Contact information: [address] [place] [address] [phone]                Allergies  Allergen Reactions   Penicillins Other (See Comments)    Told to avoid while taking methotrexate   Red Blood Cells Other (See Comments)    Jehovah's Witness - no blood products     Other Procedures/Studies: ECHOCARDIOGRAM COMPLETE  Result Date: 12/24/2022    ECHOCARDIOGRAM REPORT   Patient Name:   Shelby Mcguire Date of Exam: 12/24/2022 Medical Rec #:  [id number]         Height:       63.0 in Accession #:    [id number]        Weight:       174.2 lb Date of Birth:  1946-07-05        BSA:          1.823 m Patient Age:    66 years          BP:           168/70 mmHg Patient Gender: F                 HR:           110 bpm. Exam Location:  Inpatient Procedure: 2D Echo, Color Doppler and Cardiac Doppler Indications:    CHF  History:        Patient has no prior history of Echocardiogram examinations.                 Risk Factors:Dyslipidemia. Varicose veins of bilateral lower                 extremities with other complications.  Sonographer:    Ronny Flurry Referring  Phys: [id number] [place]  1. Left ventricular ejection fraction, by estimation, is 65 to 70%. The left ventricle has normal function. The left ventricle has no regional wall motion abnormalities. Left ventricular diastolic parameters are indeterminate.  2. Right ventricular systolic function is normal. The right ventricular size is normal.  3. The mitral valve is abnormal. Trivial mitral valve regurgitation. No evidence of mitral stenosis.  4. The aortic valve was not well visualized. There is moderate calcification of the aortic valve. There is moderate thickening of the aortic valve. Aortic valve regurgitation is mild. Aortic valve sclerosis/calcification is present, without any evidence  of aortic stenosis.  5. The inferior vena cava is normal in size with greater than 50% respiratory variability, suggesting right atrial pressure of 3 mmHg. FINDINGS  Left Ventricle: Left ventricular ejection fraction, by estimation, is 65 to 70%. The left ventricle has normal function. The left ventricle has no regional wall motion abnormalities. The left ventricular internal cavity size was normal in size. There is  no left ventricular hypertrophy. Left ventricular diastolic parameters are indeterminate. Right Ventricle: The right ventricular size is normal. No increase in right ventricular wall thickness. Right ventricular systolic function is normal. Left Atrium: Left atrial size was normal in size. Right Atrium: Right atrial size was normal in size. Pericardium: There is no evidence of pericardial effusion. Mitral Valve: The mitral valve is abnormal. There is mild  thickening of the mitral valve leaflet(s). Mild mitral annular calcification. Trivial mitral valve regurgitation. No evidence of mitral valve stenosis. Tricuspid Valve: The tricuspid valve is normal in structure. Tricuspid valve regurgitation is not demonstrated. No evidence of tricuspid stenosis. Aortic Valve: The aortic valve was not well  visualized. There is moderate calcification of the aortic valve. There is moderate thickening of the aortic valve. Aortic valve regurgitation is mild. Aortic regurgitation PHT measures 329 msec. Aortic valve sclerosis/calcification is present, without any evidence of aortic stenosis. Aortic valve mean gradient measures 5.5 mmHg. Aortic valve peak gradient measures 10.3 mmHg. Aortic valve area, by VTI measures 2.65 cm. Pulmonic Valve: The pulmonic valve was normal in structure. Pulmonic valve regurgitation is not visualized. No evidence of pulmonic stenosis. Aorta: The aortic root is normal in size and structure. Venous: The inferior vena cava is normal in size with greater than 50% respiratory variability, suggesting right atrial pressure of 3 mmHg. IAS/Shunts: No atrial level shunt detected by color flow Doppler.  LEFT VENTRICLE PLAX 2D LVIDd:         4.40 cm LVIDs:         3.00 cm LV PW:         1.00 cm LV IVS:        0.90 cm LVOT diam:     2.10 cm LV SV:         68 LV SV Index:   38 LVOT Area:     3.46 cm  RIGHT VENTRICLE             IVC RV S prime:     12.70 cm/s  IVC diam: 0.90 cm LEFT ATRIUM             Index        RIGHT ATRIUM           Index LA diam:        3.30 cm 1.81 cm/m   RA Area:     15.60 cm LA Vol (A2C):   35.8 ml 19.64 ml/m  RA Volume:   33.40 ml  18.32 ml/m LA Vol (A4C):   26.5 ml 14.53 ml/m LA Biplane Vol: 31.3 ml 17.17 ml/m  AORTIC VALVE AV Area (Vmax):    2.67 cm AV Area (Vmean):   2.47 cm AV Area (VTI):     2.65 cm AV Vmax:           160.50 cm/s AV Vmean:          107.700 cm/s AV VTI:            0.258 m AV Peak Grad:      10.3 mmHg AV Mean Grad:      5.5 mmHg LVOT Vmax:         123.67 cm/s LVOT Vmean:        76.900 cm/s LVOT VTI:          0.198 m LVOT/AV VTI ratio: 0.77 AI PHT:            329 msec  AORTA Ao Root diam: 3.30 cm Ao Asc diam:  3.20 cm MV E velocity: 111.00 cm/s                             SHUNTS                             Systemic VTI:  0.20 m                              Systemic Diam: 2.10 cm Jenkins Rouge MD Electronically signed by Jenkins Rouge MD Signature Date/Time: 12/24/2022/10:50:28 AM    Final    CT Head Wo Contrast  Result Date: 12/23/2022 CLINICAL DATA:  Dizziness. EXAM: CT HEAD WITHOUT CONTRAST TECHNIQUE: Contiguous axial images were obtained from the base of the skull through the vertex without intravenous contrast. RADIATION DOSE REDUCTION: This exam was performed according to the departmental dose-optimization program which includes automated exposure control, adjustment of the mA and/or kV according to patient size and/or use of iterative reconstruction technique. COMPARISON:  CT head venogram dated September 25, 2022. FINDINGS: Brain: No evidence of acute infarction, hemorrhage, hydrocephalus, extra-axial collection or mass lesion/mass effect. Vascular: Atherosclerotic vascular calcification of the carotid siphons. No hyperdense vessel. Skull: Normal. Negative for fracture or focal lesion. Sinuses/Orbits: No acute finding.  Bilateral globe colobomas. Other: None. IMPRESSION: 1. No acute intracranial abnormality. Electronically Signed   By: Titus Dubin M.D.   On: 12/23/2022 12:35   CT Angio Chest PE W/Cm &/Or Wo Cm  Result Date: 12/23/2022 CLINICAL DATA:  Heartburn, nausea and dizziness since knee replacement surgery EXAM: CT ANGIOGRAPHY CHEST WITH CONTRAST TECHNIQUE: Multidetector CT imaging of the chest was performed using the standard protocol during bolus administration of intravenous contrast. Multiplanar CT image reconstructions and MIPs were obtained to evaluate the vascular anatomy. RADIATION DOSE REDUCTION: This exam was performed according to the departmental dose-optimization program which includes automated exposure control, adjustment of the mA and/or kV according to patient size and/or use of iterative reconstruction technique. CONTRAST:  49m OMNIPAQUE IOHEXOL 350 MG/ML SOLN COMPARISON:  Chest x-ray, same date. FINDINGS: Cardiovascular: The  heart is normal in size. No pericardial effusion. The aorta is within normal limits in caliber. No dissection. Scattered atherosclerotic calcification. Scattered three-vessel coronary artery calcifications. The pulmonary arterial tree is well opacified. No filling defects to suggest pulmonary embolism. Mediastinum/Nodes: Small scattered mediastinal and hilar lymph nodes but no mass or overt adenopathy. The esophagus is grossly normal. Lungs/Pleura: Underlying emphysematous changes and pulmonary scarring. Dependent subpleural atelectasis and very small bilateral pleural effusions. Elongated bilobed right lower lobe pulmonary nodule versus 2 adjacent nodules. Maximum measurement is 12 mm. Indeterminate finding. Recommend follow-up noncontrast chest CT in 4-6 months to reassess. No other pulmonary nodules or pulmonary lesions. The central tracheobronchial tree is unremarkable. Upper Abdomen: No significant upper abdominal findings. Musculoskeletal: No significant bony findings. Review of the MIP images confirms the above findings. IMPRESSION: 1. No CT findings for pulmonary embolism. 2. Normal caliber thoracic aorta.  No dissection. 3. Scattered three-vessel coronary artery calcifications. 4. Dependent subpleural atelectasis and very small bilateral pleural effusions. 5. Elongated bilobed right lower lobe pulmonary nodule versus 2 adjacent nodules. Recommend follow-up noncontrast chest CT in 4-6 months to reassess. Emphysema (ICD10-J43.9). Electronically Signed   By: PMarijo SanesM.D.   On: 12/23/2022 12:34   DG Chest 2 View  Result Date: 12/23/2022 CLINICAL DATA:  Heartburn, nausea and dizziness after left knee replacement. EXAM: CHEST - 2 VIEW COMPARISON:  09/25/2022. FINDINGS: Low lung volumes accentuate the pulmonary vasculature and cardiomediastinal silhouette. No focal airspace opacity. Otherwise stable mediastinal contours. No pleural effusion or pneumothorax. IMPRESSION: Low lung volumes without evidence  of acute cardiopulmonary disease. Electronically Signed   By: WEmmit AlexandersM.D.   On: 12/23/2022 10:09  TODAY-DAY OF DISCHARGE:  Subjective:   Timika Setaro today has no headache,no chest abdominal pain,no new weakness tingling or numbness, feels much better wants to go home today.   Objective:   Blood pressure (!) 147/64, pulse 76, temperature 98 F (36.7 C), temperature source Oral, resp. rate 18, height 5' 3"$  (1.6 m), weight 79.2 kg, SpO2 97 %. No intake or output data in the 24 hours ending 12/26/22 0941 Filed Weights   12/23/22 1600 12/24/22 0422 12/26/22 0500  Weight: 78.2 kg 79 kg 79.2 kg    Exam: Awake Alert, Oriented *3, No new F.N deficits, Normal affect [place].AT,PERRAL Supple Neck,No JVD, No cervical lymphadenopathy appriciated.  Symmetrical Chest wall movement, Good air movement bilaterally, CTAB RRR,No Gallops,Rubs or new Murmurs, No Parasternal Heave +ve B.Sounds, Abd Soft, Non tender, No organomegaly appriciated, No rebound -guarding or rigidity. No Cyanosis, Clubbing or edema, No new Rash or bruise   PERTINENT RADIOLOGIC STUDIES: ECHOCARDIOGRAM COMPLETE  Result Date: 12/24/2022    ECHOCARDIOGRAM REPORT   Patient Name:   Shelby Mcguire Date of Exam: 12/24/2022 Medical Rec #:  [id number]         Height:       63.0 in Accession #:    [id number]        Weight:       174.2 lb Date of Birth:  1946/07/21        BSA:          1.823 m Patient Age:    78 years          BP:           168/70 mmHg Patient Gender: F                 HR:           110 bpm. Exam Location:  Inpatient Procedure: 2D Echo, Color Doppler and Cardiac Doppler Indications:    CHF  History:        Patient has no prior history of Echocardiogram examinations.                 Risk Factors:Dyslipidemia. Varicose veins of bilateral lower                 extremities with other complications.  Sonographer:    Ronny Flurry Referring Phys: [id number] [place]  1. Left ventricular ejection  fraction, by estimation, is 65 to 70%. The left ventricle has normal function. The left ventricle has no regional wall motion abnormalities. Left ventricular diastolic parameters are indeterminate.  2. Right ventricular systolic function is normal. The right ventricular size is normal.  3. The mitral valve is abnormal. Trivial mitral valve regurgitation. No evidence of mitral stenosis.  4. The aortic valve was not well visualized. There is moderate calcification of the aortic valve. There is moderate thickening of the aortic valve. Aortic valve regurgitation is mild. Aortic valve sclerosis/calcification is present, without any evidence  of aortic stenosis.  5. The inferior vena cava is normal in size with greater than 50% respiratory variability, suggesting right atrial pressure of 3 mmHg. FINDINGS  Left Ventricle: Left ventricular ejection fraction, by estimation, is 65 to 70%. The left ventricle has normal function. The left ventricle has no regional wall motion abnormalities. The left ventricular internal cavity size was normal in size. There is  no left ventricular hypertrophy. Left ventricular diastolic parameters are indeterminate. Right Ventricle: The right ventricular size is normal. No increase in right ventricular wall  thickness. Right ventricular systolic function is normal. Left Atrium: Left atrial size was normal in size. Right Atrium: Right atrial size was normal in size. Pericardium: There is no evidence of pericardial effusion. Mitral Valve: The mitral valve is abnormal. There is mild thickening of the mitral valve leaflet(s). Mild mitral annular calcification. Trivial mitral valve regurgitation. No evidence of mitral valve stenosis. Tricuspid Valve: The tricuspid valve is normal in structure. Tricuspid valve regurgitation is not demonstrated. No evidence of tricuspid stenosis. Aortic Valve: The aortic valve was not well visualized. There is moderate calcification of the aortic valve. There is  moderate thickening of the aortic valve. Aortic valve regurgitation is mild. Aortic regurgitation PHT measures 329 msec. Aortic valve sclerosis/calcification is present, without any evidence of aortic stenosis. Aortic valve mean gradient measures 5.5 mmHg. Aortic valve peak gradient measures 10.3 mmHg. Aortic valve area, by VTI measures 2.65 cm. Pulmonic Valve: The pulmonic valve was normal in structure. Pulmonic valve regurgitation is not visualized. No evidence of pulmonic stenosis. Aorta: The aortic root is normal in size and structure. Venous: The inferior vena cava is normal in size with greater than 50% respiratory variability, suggesting right atrial pressure of 3 mmHg. IAS/Shunts: No atrial level shunt detected by color flow Doppler.  LEFT VENTRICLE PLAX 2D LVIDd:         4.40 cm LVIDs:         3.00 cm LV PW:         1.00 cm LV IVS:        0.90 cm LVOT diam:     2.10 cm LV SV:         68 LV SV Index:   38 LVOT Area:     3.46 cm  RIGHT VENTRICLE             IVC RV S prime:     12.70 cm/s  IVC diam: 0.90 cm LEFT ATRIUM             Index        RIGHT ATRIUM           Index LA diam:        3.30 cm 1.81 cm/m   RA Area:     15.60 cm LA Vol (A2C):   35.8 ml 19.64 ml/m  RA Volume:   33.40 ml  18.32 ml/m LA Vol (A4C):   26.5 ml 14.53 ml/m LA Biplane Vol: 31.3 ml 17.17 ml/m  AORTIC VALVE AV Area (Vmax):    2.67 cm AV Area (Vmean):   2.47 cm AV Area (VTI):     2.65 cm AV Vmax:           160.50 cm/s AV Vmean:          107.700 cm/s AV VTI:            0.258 m AV Peak Grad:      10.3 mmHg AV Mean Grad:      5.5 mmHg LVOT Vmax:         123.67 cm/s LVOT Vmean:        76.900 cm/s LVOT VTI:          0.198 m LVOT/AV VTI ratio: 0.77 AI PHT:            329 msec  AORTA Ao Root diam: 3.30 cm Ao Asc diam:  3.20 cm MV E velocity: 111.00 cm/s  SHUNTS                             Systemic VTI:  0.20 m                             Systemic Diam: 2.10 cm Jenkins Rouge MD Electronically signed by Jenkins Rouge MD Signature Date/Time: 12/24/2022/10:50:28 AM    Final      PERTINENT LAB RESULTS: CBC: Recent Labs    12/23/22 0955 12/24/22 0053  WBC 13.5* 10.4  HGB 12.2 11.4*  HCT 32.7* 31.9*  PLT 221 174   CMET CMP     Component Value Date/Time   NA 132 (L) 12/26/2022 0435   NA 139 08/03/2016 1009   K 4.1 12/26/2022 0435   K 4.7 08/03/2016 1009   CL 97 (L) 12/26/2022 0435   CO2 24 12/26/2022 0435   CO2 27 08/03/2016 1009   GLUCOSE 108 (H) 12/26/2022 0435   GLUCOSE 77 08/03/2016 1009   BUN 8 12/26/2022 0435   BUN 15.6 08/03/2016 1009   CREATININE 0.49 12/26/2022 0435   CREATININE 0.7 08/03/2016 1009   CALCIUM 8.6 (L) 12/26/2022 0435   CALCIUM 9.7 08/03/2016 1009   PROT 6.1 (L) 12/23/2022 0955   PROT 6.7 08/03/2016 1009   ALBUMIN 3.6 12/23/2022 0955   ALBUMIN 3.5 08/03/2016 1009   AST 69 (H) 12/23/2022 0955   AST 16 08/03/2016 1009   ALT 35 12/23/2022 0955   ALT 14 08/03/2016 1009   ALKPHOS 46 12/23/2022 0955   ALKPHOS 82 08/03/2016 1009   BILITOT 1.2 12/23/2022 0955   BILITOT 0.34 08/03/2016 1009   GFRNONAA >60 12/26/2022 0435   GFRAA >60 11/01/2018 2020    GFR Estimated Creatinine Clearance: 59.6 mL/min (by C-G formula based on SCr of 0.49 mg/dL). No results for input(s): "LIPASE", "AMYLASE" in the last 72 hours. No results for input(s): "CKTOTAL", "CKMB", "CKMBINDEX", "TROPONINI" in the last 72 hours. Invalid input(s): "POCBNP" No results for input(s): "DDIMER" in the last 72 hours. No results for input(s): "HGBA1C" in the last 72 hours. No results for input(s): "CHOL", "HDL", "LDLCALC", "TRIG", "CHOLHDL", "LDLDIRECT" in the last 72 hours. Recent Labs    12/23/22 0955  TSH 0.843   No results for input(s): "VITAMINB12", "FOLATE", "FERRITIN", "TIBC", "IRON", "RETICCTPCT" in the last 72 hours. Coags: No results for input(s): "INR" in the last 72 hours.  Invalid input(s): "PT" Microbiology: No results found for this or any previous visit (from the past 240  hour(s)).  FURTHER DISCHARGE INSTRUCTIONS:  Get Medicines reviewed and adjusted: Please take all your medications with you for your next visit with your Primary MD  Laboratory/radiological data: Please request your Primary MD to go over all hospital tests and procedure/radiological results at the follow up, please ask your Primary MD to get all Hospital records sent to his/her office.  In some cases, they will be blood work, cultures and biopsy results pending at the time of your discharge. Please request that your primary care M.D. goes through all the records of your hospital data and follows up on these results.  Also Note the following: If you experience worsening of your admission symptoms, develop shortness of breath, life threatening emergency, suicidal or homicidal thoughts you must seek medical attention immediately by calling 911 or calling your MD immediately  if symptoms less severe.  You must read complete instructions/literature along with  all the possible adverse reactions/side effects for all the Medicines you take and that have been prescribed to you. Take any new Medicines after you have completely understood and accpet all the possible adverse reactions/side effects.   Do not drive when taking Pain medications or sleeping medications (Benzodaizepines)  Do not take more than prescribed Pain, Sleep and Anxiety Medications. It is not advisable to combine anxiety,sleep and pain medications without talking with your primary care practitioner  Special Instructions: If you have smoked or chewed Tobacco  in the last 2 yrs please stop smoking, stop any regular Alcohol  and or any Recreational drug use.  Wear Seat belts while driving.  Please note: You were cared for by a hospitalist during your hospital stay. Once you are discharged, your primary care physician will handle any further medical issues. Please note that NO REFILLS for any discharge medications will be authorized once  you are discharged, as it is imperative that you return to your primary care physician (or establish a relationship with a primary care physician if you do not have one) for your post hospital discharge needs so that they can reassess your need for medications and monitor your lab values.  Total Time spent coordinating discharge including counseling, education and face to face time equals greater than 30 minutes.  SignedOren Binet 12/26/2022 9:41 AM

## 2022-12-27 ENCOUNTER — Ambulatory Visit: Payer: Medicare Other

## 2022-12-29 ENCOUNTER — Emergency Department (HOSPITAL_COMMUNITY)
Admission: EM | Admit: 2022-12-29 | Discharge: 2022-12-29 | Disposition: A | Discharge status: Home / Self Care | Payer: Medicare Other | Attending: Emergency Medicine | Admitting: Emergency Medicine

## 2022-12-29 ENCOUNTER — Other Ambulatory Visit: Payer: Self-pay

## 2022-12-29 ENCOUNTER — Other Ambulatory Visit: Payer: Medicare Other

## 2022-12-29 ENCOUNTER — Telehealth: Payer: Self-pay

## 2022-12-29 ENCOUNTER — Emergency Department (HOSPITAL_COMMUNITY): Payer: Medicare Other

## 2022-12-29 DIAGNOSIS — R072 Precordial pain: Secondary | ICD-10-CM | POA: Insufficient documentation

## 2022-12-29 DIAGNOSIS — Z7901 Long term (current) use of anticoagulants: Secondary | ICD-10-CM | POA: Diagnosis not present

## 2022-12-29 DIAGNOSIS — E871 Hypo-osmolality and hyponatremia: Secondary | ICD-10-CM | POA: Diagnosis not present

## 2022-12-29 DIAGNOSIS — Z79899 Other long term (current) drug therapy: Secondary | ICD-10-CM | POA: Insufficient documentation

## 2022-12-29 DIAGNOSIS — R0789 Other chest pain: Secondary | ICD-10-CM

## 2022-12-29 DIAGNOSIS — R1310 Dysphagia, unspecified: Secondary | ICD-10-CM | POA: Diagnosis not present

## 2022-12-29 LAB — LIPASE, BLOOD: Lipase: 29 U/L (ref 11–51)

## 2022-12-29 LAB — CBC WITH DIFFERENTIAL/PLATELET
Abs Immature Granulocytes: 0.04 10*3/uL (ref 0.00–0.07)
Basophils Absolute: 0.1 10*3/uL (ref 0.0–0.1)
Basophils Relative: 1 %
Eosinophils Absolute: 0.3 10*3/uL (ref 0.0–0.5)
Eosinophils Relative: 4 %
HCT: 41.4 % (ref 36.0–46.0)
Hemoglobin: 12.8 g/dL (ref 12.0–15.0)
Immature Granulocytes: 0 %
Lymphocytes Relative: 22 %
Lymphs Abs: 2 10*3/uL (ref 0.7–4.0)
MCH: 30.7 pg (ref 26.0–34.0)
MCHC: 30.9 g/dL (ref 30.0–36.0)
MCV: 99.3 fL (ref 80.0–100.0)
Monocytes Absolute: 1.1 10*3/uL — ABNORMAL HIGH (ref 0.1–1.0)
Monocytes Relative: 12 %
Neutro Abs: 5.4 10*3/uL (ref 1.7–7.7)
Neutrophils Relative %: 61 %
Platelets: 366 10*3/uL (ref 150–400)
RBC: 4.17 MIL/uL (ref 3.87–5.11)
RDW: 13 % (ref 11.5–15.5)
WBC: 9 10*3/uL (ref 4.0–10.5)
nRBC: 0 % (ref 0.0–0.2)

## 2022-12-29 LAB — COMPREHENSIVE METABOLIC PANEL
ALT: 27 U/L (ref 0–44)
AST: 22 U/L (ref 15–41)
Albumin: 3.7 g/dL (ref 3.5–5.0)
Alkaline Phosphatase: 54 U/L (ref 38–126)
Anion gap: 11 (ref 5–15)
BUN: 9 mg/dL (ref 8–23)
CO2: 26 mmol/L (ref 22–32)
Calcium: 9 mg/dL (ref 8.9–10.3)
Chloride: 96 mmol/L — ABNORMAL LOW (ref 98–111)
Creatinine, Ser: 0.78 mg/dL (ref 0.44–1.00)
GFR, Estimated: >60 mL/min (ref >60)
Glucose, Bld: 104 mg/dL — ABNORMAL HIGH (ref 70–99)
Potassium: 3.5 mmol/L (ref 3.5–5.1)
Sodium: 133 mmol/L — ABNORMAL LOW (ref 135–145)
Total Bilirubin: 1.4 mg/dL — ABNORMAL HIGH (ref 0.3–1.2)
Total Protein: 6.2 g/dL — ABNORMAL LOW (ref 6.5–8.1)

## 2022-12-29 LAB — TROPONIN I (HIGH SENSITIVITY)
Troponin I (High Sensitivity): 5 ng/L (ref <18)
Troponin I (High Sensitivity): 4 ng/L (ref <18)

## 2022-12-29 MED ORDER — ASPIRIN 325 MG PO TABS
325.0000 mg | ORAL_TABLET | Freq: Every day | ORAL | Status: DC
  Administered 2022-12-29: 325 mg via ORAL
  Filled 2022-12-29: qty 1

## 2022-12-29 MED ORDER — ALUM & MAG HYDROXIDE-SIMETH 200-200-20 MG/5ML PO SUSP
30.0000 mL | Freq: Once | ORAL | Status: AC
  Administered 2022-12-29: 30 mL via ORAL
  Filled 2022-12-29: qty 30

## 2022-12-29 NOTE — Telephone Encounter (Signed)
Patient thought that she may be having a heart attack or acid reflux attack. She states she has chest pain in the middle of chest but no back or arm pain or shortness of breath. I advised patient to call 911. Patient verbalized understanding.

## 2022-12-29 NOTE — ED Triage Notes (Signed)
Patient BIB EMS from home, c/o epigastric pain, 200/90,16, 80,96%RA. Recent knee surgery. Denies SOB/chest pain.

## 2022-12-29 NOTE — Discharge Instructions (Addendum)
You were evaluated in the emergency department for chest pain.  We did troponins which indicate if you are having heart muscle damage and these were normal.  We think your chest pain is likely secondary to gastritis or esophagitis, I recommend using Maalox and adding on famotidine to your omeprazole to help with acid reduction.  Her labs did show a level called bilirubin was a little elevated when compared to last value, I recommend you follow-up with your PCP in 2 to 3 days to have this rechecked.  Please return to the ED if you are having any worsening of your chest pain or any shortness of breath, or if you are having abdominal pain or concern for jaundice which is yellowing of your eyes and skin.  Please follow-up with your PCP in 2 to 3 days for reevaluation.

## 2022-12-29 NOTE — ED Notes (Signed)
Shift reports received, assumed care of patient at this time.

## 2022-12-29 NOTE — ED Provider Notes (Signed)
[facility] Provider Note   CSN: [id number] Arrival date & time: 12/29/22  1729     History  No chief complaint on file.   Shelby Mcguire is a 77 y.o. female.  This is a 77 year old female with history of rheumatoid arthritis and newly diagnosed paroxysmal atrial fibrillation on Eliquis presenting to the ED for chest pain.  Patient states over the last couple weeks she has had worsening difficulty swallowing and substernal chest pain when eating, this improves after drinking some water.  Today about an hour and a half prior to arrival she experienced substernal chest discomfort when she was sitting in her chair which was different from usual.  She is unsure if it is exertional as she did not try to walk around, she denies any shortness of breath, nausea, vomiting, diaphoresis.  No history of coronary artery disease.        Home Medications Prior to Admission medications   Medication Sig Start Date End Date Taking? Authorizing Provider  acetaminophen (TYLENOL) 650 MG CR tablet Take 1,300 mg by mouth daily as needed for pain.    [provider]  apixaban (ELIQUIS) 5 MG TABS tablet Take 1 tablet (5 mg total) by mouth 2 (two) times daily. 12/26/22   Ghimire, Henreitta Leber, MD  ASCORBIC ACID PO Take 1 tablet by mouth daily.    [provider]  BIOTIN PO Take 1 tablet by mouth daily.    [provider]  Calcium Carb-Cholecalciferol (CALCIUM + VITAMIN D3 PO) Take 1 tablet by mouth 2 (two) times daily.    [provider]  Cholecalciferol (VITAMIN D-3 PO) Take 1 capsule by mouth daily.    [provider]  diltiazem (CARDIZEM SR) 60 MG 12 hr capsule Take 1 capsule (60 mg total) by mouth every 12 (twelve) hours. 12/26/22   Ghimire, Henreitta Leber, MD  diltiazem (CARDIZEM) 30 MG tablet Take 1 tablet (30 mg total) by mouth 3 (three) times daily as needed (tachycardia). 12/26/22 12/26/23  Jonetta Osgood, MD  folic  acid (FOLVITE) 1 MG tablet Take 1 mg by mouth 3 (three) times daily.    [provider]  furosemide (LASIX) 40 MG tablet Take 1 tablet (40 mg total) by mouth daily. 12/27/22   Ghimire, Henreitta Leber, MD  L-LYSINE PO Take 1 tablet by mouth daily.    [provider]  methocarbamol (ROBAXIN) 500 MG tablet Take 1 tablet (500 mg total) by mouth 4 (four) times daily. Patient not taking: Reported on 12/23/2022 12/21/22   Drue Novel, PA  methotrexate 2.5 MG tablet Take 8 tablets (20 mg total) by mouth once a week. Caution: Chemotherapy. Protect from light. Patient taking differently: Take 20 mg by mouth every Friday. Caution: Chemotherapy. Protect from light. Take 10 mg in the morning and 10 mg at lunch time every Friday 08/16/16   Nicholas Lose, MD  MILK THISTLE EXTRACT PO Take 1 tablet by mouth 3 (three) times daily.    [provider]  Omega-3 Fatty Acids (FISH OIL PO) Take 1 capsule by mouth daily.    [provider]  omeprazole (PRILOSEC) 20 MG capsule Take 20 mg by mouth daily as needed (heart burn).    [provider]  ondansetron (ZOFRAN) 4 MG tablet Take 1 tablet (4 mg total) by mouth daily as needed for nausea or vomiting. 12/21/22 12/21/23  Elizabeth Sauer R, PA  oxyCODONE (ROXICODONE) 5 MG immediate release tablet Take 1  tablet (5 mg total) by mouth every 4 (four) hours as needed for severe pain. 12/21/22   Drue Novel, PA  potassium chloride (KLOR-CON M) 10 MEQ tablet Take 1 tablet (10 mEq total) by mouth daily. 12/26/22   Ghimire, Henreitta Leber, MD  predniSONE (DELTASONE) 10 MG tablet Take 10 mg by mouth daily as needed (RA flare).    [provider]  Red Yeast Rice Extract (RED YEAST RICE PO) Take 1 tablet by mouth 2 (two) times daily.    [provider]  traMADol (ULTRAM) 50 MG tablet Take 1 tablet (50 mg total) by mouth every 6 (six) hours as needed. Patient taking differently: Take 50 mg by mouth every 6 (six) hours as needed (pain).  12/21/22 12/21/23  Drue Novel, PA      Allergies    Penicillins and Red blood cells    Review of Systems   Review of Systems  Constitutional:  Negative for chills and fever.  Respiratory:  Negative for cough and shortness of breath.   Cardiovascular:  Positive for chest pain. Negative for palpitations.  Gastrointestinal:  Negative for abdominal pain, nausea and vomiting.  Genitourinary:  Negative for dysuria.  Neurological:  Negative for dizziness and syncope.    Physical Exam Updated Vital Signs BP (!) 151/91   Pulse 91   Temp 98 F (36.7 C)   Resp 16   SpO2 99%  Physical Exam Vitals and nursing note reviewed.  Constitutional:      General: She is not in acute distress.    Appearance: Normal appearance. She is not ill-appearing.  HENT:     Head: Normocephalic.     Nose: Nose normal.     Mouth/Throat:     Mouth: Mucous membranes are moist.  Eyes:     Extraocular Movements: Extraocular movements intact.     Pupils: Pupils are equal, round, and reactive to light.  Cardiovascular:     Rate and Rhythm: Normal rate and regular rhythm.     Pulses: Normal pulses.     Heart sounds: Normal heart sounds. No murmur heard.    No friction rub. No gallop.  Pulmonary:     Effort: Pulmonary effort is normal. No respiratory distress.     Breath sounds: No wheezing or rales.  Abdominal:     General: There is no distension.     Palpations: Abdomen is soft.     Tenderness: There is no abdominal tenderness. There is no guarding or rebound.  Musculoskeletal:        General: Normal range of motion.  Skin:    General: Skin is warm.     Capillary Refill: Capillary refill takes less than 2 seconds.  Neurological:     General: No focal deficit present.     Mental Status: She is alert and oriented to person, place, and time.     ED Results / Procedures / Treatments   Labs (all labs ordered are listed, but only abnormal results are displayed) Labs Reviewed  CBC WITH  DIFFERENTIAL/PLATELET - Abnormal; Notable for the following components:      Result Value   Monocytes Absolute 1.1 (*)    All other components within normal limits  COMPREHENSIVE METABOLIC PANEL - Abnormal; Notable for the following components:   Sodium 133 (*)    Chloride 96 (*)    Glucose, Bld 104 (*)    Total Protein 6.2 (*)    Total Bilirubin 1.4 (*)    All other components  within normal limits  LIPASE, BLOOD  TROPONIN I (HIGH SENSITIVITY)  TROPONIN I (HIGH SENSITIVITY)    EKG EKG Interpretation  Date/Time:  Thursday December 29 2022 17:40:42 EST Ventricular Rate:  82 PR Interval:  158 QRS Duration: 106 QT Interval:  382 QTC Calculation: 447 R Axis:   57 Text Interpretation: Sinus rhythm since last tracing no significant change Confirmed by Malvin Johns [phone]) on 12/29/2022 5:52:38 PM  Radiology DG Chest 2 View  Result Date: 12/29/2022 CLINICAL DATA:  Chest pain. Epigastric pain. Recent knee surgery. Former smoker. EXAM: CHEST - 2 VIEW COMPARISON:  12/23/2022 FINDINGS: Heart size and pulmonary vascularity are normal. Extrinsic electronic device projected over the left mid lung on the AP view. Lungs are clear. Emphysematous changes are demonstrated. No pleural effusions. No pneumothorax. Mediastinal contours appear intact. Calcification of the aorta. Degenerative changes in the spine. IMPRESSION: No active cardiopulmonary disease. Electronically Signed   By: Lucienne Capers M.D.   On: 12/29/2022 18:20    Procedures Procedures    Medications Ordered in ED Medications  aspirin tablet 325 mg (325 mg Oral Given 12/29/22 1833)  alum & mag hydroxide-simeth (MAALOX/MYLANTA) 200-200-20 MG/5ML suspension 30 mL (30 mLs Oral Given 12/29/22 2122)    ED Course/ Medical Decision Making/ A&P                             Medical Decision Making Patient presents with atypical chest pain, it is nonexertional, does not radiate, no other associated symptoms.  Will obtain an ACS workup  including serial troponins and EKG.  CBC and CMP ordered due to patient's history of hyponatremia.  325 mg aspirin ordered.  Differential diagnosis: Pneumothorax, ACS, aortic dissection, esophagitis, gastritis  Patient has equal pulses in both arms, equal blood pressures in bilateral arms, and pain does not cross midline or radiate to back, recent CT of her chest 6 days ago shows no aortic dilation or ectasia, no aortic dissection, low suspicion for acute aortic dissection at this time.  I personally reviewed and interpreted patient's labs which were significant for mild hyponatremia of 133, which appears chronic when compared to prior.  Slightly elevated total bilirubin of 1.4.  Patient is not jaundiced on exam and has no tenderness in the right upper quadrant.  Troponins were negative at 4 and 5 respectively, CBC shows no leukocytosis.  Suspicion for ACS at this time.  I personally reviewed and interpreted patient's chest x-ray which shows no focal consolidations, no pleural effusions or pulmonary edema, cardiac silhouette within normal limits.  I personally reviewed and interpreted patient's EKG which shows normal sinus rhythm with rate 82, PR, QRS, QTc normal, no acute ischemic changes.  Patient given Maalox for gastritis/esophagitis, I discussed the patient her ACS workup is negative and that she should follow-up outpatient with her PCP.  I recommended starting famotidine on top of her omeprazole if she continues to have reflux symptoms.  Turn precautions given if she has worsening chest pain or shortness of breath.  Patient was stable at discharge.  Problems Addressed: Other chest pain: acute illness or injury that poses a threat to life or bodily functions  Amount and/or Complexity of Data Reviewed External Data Reviewed: notes.    Details: Per chart review patient recently discharged from the hospital after admission for hyponatremia and newly found lung nodule. Labs: ordered.  Decision-making details documented in ED Course. Radiology: ordered and independent interpretation performed. Decision-making details documented in ED  Course. ECG/medicine tests: ordered and independent interpretation performed. Decision-making details documented in ED Course.  Risk OTC drugs.          Final Clinical Impression(s) / ED Diagnoses Final diagnoses:  Other chest pain    Rx / DC Orders ED Discharge Orders     None         Jimmie Molly, MD 12/29/22 2210    Malvin Johns, MD 12/29/22 2225

## 2023-01-03 ENCOUNTER — Other Ambulatory Visit (HOSPITAL_COMMUNITY): Payer: Self-pay

## 2023-01-03 ENCOUNTER — Telehealth: Payer: Self-pay

## 2023-01-03 NOTE — Telephone Encounter (Signed)
Patient is calling because "she isn't feeling well". She says she has diarrhea, jitteriness, and weakness. She went to PCP yesterday and they told her it could be from her knee replacement, the heart monitor, and medication changes. They did not test her for COVID or Flu. She is wanting to know what you think she should do.

## 2023-01-04 NOTE — Telephone Encounter (Signed)
Reasonable to get checked for both if symptoms do not improve.  Thanks MJP

## 2023-01-05 ENCOUNTER — Encounter: Payer: Self-pay | Admitting: Cardiology

## 2023-01-05 ENCOUNTER — Telehealth: Payer: Self-pay

## 2023-01-05 ENCOUNTER — Ambulatory Visit: Payer: Medicare Other | Admitting: Cardiology

## 2023-01-05 VITALS — BP 131/67 | HR 88 | Resp 17 | Ht 63.0 in | Wt 150.0 lb

## 2023-01-05 DIAGNOSIS — I480 Paroxysmal atrial fibrillation: Secondary | ICD-10-CM

## 2023-01-05 DIAGNOSIS — E871 Hypo-osmolality and hyponatremia: Secondary | ICD-10-CM

## 2023-01-05 NOTE — Telephone Encounter (Signed)
Per last message, patient went to PCP to be tested for COVID and flu. She is negative for both. She is still feeling jittery and weak, diarrhea isn't as bad as it was. She is feeling like these symptoms are from her medication changes, and is insistent to come in to be seen. I will get her on the schedule if this is okay?

## 2023-01-05 NOTE — Progress Notes (Signed)
TOC visit  Subjective:   Shelby Mcguire, female    DOB: 1946/07/07, 77 y.o.   MRN: [id number]   HPI  Chief Complaint  Patient presents with   Atrial Fibrillation   Medication Problem    77 y.o. Caucasian female  with hyperlipidemia, RA, PAF, hyponatremia due to SIADH  Afib was diagnosed during recent hospitalization due to acute severe metabolic encephalopathy due to hyponatremia.  Patient self converted to sinus rhythm <24 hours from onset.  She was recommended Eliquis 5 mg bid.   Patient made today's urgent appointment due to complaints of jitteriness, also has had diarrhea recently.  Separately, a week ago, she was seen in ER for symptoms of feeling of something stuck in her chest, associated with regurgitation.  ACS was excluded.  Her symptoms were thought to be possibly GI in etiology.     Current Outpatient Medications:    acetaminophen (TYLENOL) 650 MG CR tablet, Take 1,300 mg by mouth daily as needed for pain., Disp: , Rfl:    apixaban (ELIQUIS) 5 MG TABS tablet, Take 1 tablet (5 mg total) by mouth 2 (two) times daily., Disp: 60 tablet, Rfl: 2   ASCORBIC ACID PO, Take 1 tablet by mouth daily., Disp: , Rfl:    BIOTIN PO, Take 1 tablet by mouth daily., Disp: , Rfl:    Calcium Carb-Cholecalciferol (CALCIUM + VITAMIN D3 PO), Take 1 tablet by mouth 2 (two) times daily., Disp: , Rfl:    Cholecalciferol (VITAMIN D-3 PO), Take 1 capsule by mouth daily., Disp: , Rfl:    diltiazem (CARDIZEM SR) 60 MG 12 hr capsule, Take 1 capsule (60 mg total) by mouth every 12 (twelve) hours., Disp: 60 capsule, Rfl: 2   folic acid (FOLVITE) 1 MG tablet, Take 1 mg by mouth 3 (three) times daily., Disp: , Rfl:    furosemide (LASIX) 40 MG tablet, Take 1 tablet (40 mg total) by mouth daily., Disp: 30 tablet, Rfl: 2   L-LYSINE PO, Take 1 tablet by mouth daily., Disp: , Rfl:    methotrexate 2.5 MG tablet, Take 8 tablets (20 mg total) by mouth once a week. Caution: Chemotherapy. Protect from light.  (Patient taking differently: Take 20 mg by mouth every Friday. Caution: Chemotherapy. Protect from light. Take 10 mg in the morning and 10 mg at lunch time every Friday), Disp: , Rfl:    MILK THISTLE EXTRACT PO, Take 1 tablet by mouth 3 (three) times daily., Disp: , Rfl:    Omega-3 Fatty Acids (FISH OIL PO), Take 1 capsule by mouth daily., Disp: , Rfl:    potassium chloride (KLOR-CON M) 10 MEQ tablet, Take 1 tablet (10 mEq total) by mouth daily., Disp: 30 tablet, Rfl: 1   predniSONE (DELTASONE) 10 MG tablet, Take 10 mg by mouth daily as needed (RA flare)., Disp: , Rfl:    Red Yeast Rice Extract (RED YEAST RICE PO), Take 1 tablet by mouth 2 (two) times daily., Disp: , Rfl:    omeprazole (PRILOSEC) 20 MG capsule, Take 20 mg by mouth daily as needed (heart burn). (Patient not taking: Reported on 01/05/2023), Disp: , Rfl:    Cardiovascular & other pertient studies:  Reviewed external labs and tests, independently interpreted  EKG 01/05/2023: Sinus rhythm 83 bpm  Normal EKG   Recent labs: 12/29/2022: Glucose 104, BUN/Cr 9/0.78. EGFR >60. Na/K 133/3.5. Protein 6.2. Rest of the CMP normal H/H 12/41. MCV 99. Platelets 366 TSH 0.8 normal   Latest Reference Range & Units 12/24/22  05:52 12/24/22 10:08 12/25/22 04:02 12/26/22 04:35 12/29/22 18:42  Sodium 135 - 145 mmol/L 128 (L) 127 (L) 128 (L) 132 (L) 133 (L)  (L): Data is abnormally low   Review of Systems  Constitutional:       Feels jittery  Cardiovascular:  Negative for chest pain, dyspnea on exertion, leg swelling, palpitations and syncope.         Vitals:   01/05/23 1338  BP: 131/67  Pulse: 88  Resp: 17  SpO2: 96%    Body mass index is 26.57 kg/m. Filed Weights   01/05/23 1338  Weight: 150 lb (68 kg)    Objective:   Physical Exam Vitals and nursing note reviewed.  Constitutional:      General: She is not in acute distress. Neck:     Vascular: No JVD.  Cardiovascular:     Rate and Rhythm: Normal rate and regular  rhythm.     Heart sounds: Normal heart sounds. No murmur heard. Pulmonary:     Effort: Pulmonary effort is normal.     Breath sounds: Normal breath sounds. No wheezing or rales.  Musculoskeletal:     Right lower leg: No edema.     Left lower leg: No edema.             Visit diagnoses:   ICD-10-CM   1. PAF (paroxysmal atrial fibrillation) (HCC)  I48.0 EKG XX123456    Basic metabolic panel    2. Hyponatremia  E87.1        Orders Placed This Encounter  Procedures   Basic metabolic panel   EKG [id number]     Assessment & Recommendations:   77 y.o. Caucasian female  with hyperlipidemia, RA, PAF, hyponatremia due to SIADH  PAF: Patient is in sinus rhythm today.  Her current symptoms of jitteriness unlikely to be cardiac origin.  She has had recent diarrhea.  She has cut down water intake drastically due to recent hospitalization due to hyponatremia from SIADH.  On physical exam, she does appear to be slightly dehydrated.  I have asked her to cautiously increase her water intake by 1 bottle.  She will repeat labs in next 2-3 days, including BMP.  This will also tell us about her current sodium level. She is currently taking diltiazem 60 mg twice daily, which is half the dose that she was taking prior to her recent hospitalization.  I do not think diltiazem is causing her symptoms.  Continue the same. CHA2DS2VASc score 3, annual stroke risk 3.6%.  Continue Eliquis 5 mg twice daily.        Nigel Mormon, MD Pager: [phone] Office: [phone]

## 2023-01-06 LAB — BASIC METABOLIC PANEL
BUN/Creatinine Ratio: 19 (ref 12–28)
BUN: 14 mg/dL (ref 8–27)
CO2: 27 mmol/L (ref 20–29)
Calcium: 9.7 mg/dL (ref 8.7–10.3)
Chloride: 97 mmol/L (ref 96–106)
Creatinine, Ser: 0.72 mg/dL (ref 0.57–1.00)
Glucose: 105 mg/dL — ABNORMAL HIGH (ref 70–99)
Potassium: 3.8 mmol/L (ref 3.5–5.2)
Sodium: 139 mmol/L (ref 134–144)
eGFR: 87 mL/min/{1.73_m2} (ref >59)

## 2023-01-06 NOTE — Progress Notes (Signed)
Patient is aware 

## 2023-01-06 NOTE — Progress Notes (Signed)
Labs look good, including sodium. There is no definite evidence of dehydration, but knowing that the sodium is very normal now at 139, you could afford to drink a bottle or two of water more.  Thanks MJP

## 2023-01-23 ENCOUNTER — Ambulatory Visit: Payer: Medicare Other | Admitting: Cardiology

## 2023-01-24 ENCOUNTER — Encounter: Payer: Self-pay | Admitting: Cardiology

## 2023-01-24 ENCOUNTER — Ambulatory Visit: Payer: Medicare Other | Admitting: Cardiology

## 2023-01-24 VITALS — BP 138/69 | HR 85 | Resp 16 | Ht 63.0 in | Wt 148.0 lb

## 2023-01-24 DIAGNOSIS — I480 Paroxysmal atrial fibrillation: Secondary | ICD-10-CM

## 2023-01-24 MED ORDER — APIXABAN 5 MG PO TABS: 5.0000 mg | ORAL_TABLET | Freq: Two times a day (BID) | ORAL | 3 refills | Status: DC

## 2023-01-24 NOTE — Addendum Note (Signed)
Addended by: Nigel Mormon on: 01/24/2023 05:45 PM   Modules accepted: Orders

## 2023-01-24 NOTE — Progress Notes (Signed)
TOC visit  Subjective:   Shelby Mcguire, female    DOB: 1946-09-03, 77 y.o.   MRN: [id number]   HPI  Chief Complaint  Patient presents with   Atrial Fibrillation   Follow-up    77 y.o. Caucasian female  with hyperlipidemia, RA, PAF, hyponatremia due to SIADH  Patient is feeling much better.  Her jitteriness has improved.  She suspects this may have been due to sudden withdrawal from Xanax.  She is now weaning herself off Xanax.  Overall, she is feeling better.  Reviewed recent test results with the patient, details below.    Current Outpatient Medications:    acetaminophen (TYLENOL) 650 MG CR tablet, Take 1,300 mg by mouth daily as needed for pain., Disp: , Rfl:    apixaban (ELIQUIS) 5 MG TABS tablet, Take 1 tablet (5 mg total) by mouth 2 (two) times daily., Disp: 60 tablet, Rfl: 2   ASCORBIC ACID PO, Take 1 tablet by mouth daily., Disp: , Rfl:    BIOTIN PO, Take 1 tablet by mouth daily., Disp: , Rfl:    Calcium Carb-Cholecalciferol (CALCIUM + VITAMIN D3 PO), Take 1 tablet by mouth 2 (two) times daily., Disp: , Rfl:    Cholecalciferol (VITAMIN D-3 PO), Take 1 capsule by mouth daily., Disp: , Rfl:    diltiazem (CARDIZEM SR) 60 MG 12 hr capsule, Take 1 capsule (60 mg total) by mouth every 12 (twelve) hours., Disp: 60 capsule, Rfl: 2   folic acid (FOLVITE) 1 MG tablet, Take 1 mg by mouth 3 (three) times daily., Disp: , Rfl:    furosemide (LASIX) 40 MG tablet, Take 1 tablet (40 mg total) by mouth daily., Disp: 30 tablet, Rfl: 2   L-LYSINE PO, Take 1 tablet by mouth daily., Disp: , Rfl:    methotrexate 2.5 MG tablet, Take 8 tablets (20 mg total) by mouth once a week. Caution: Chemotherapy. Protect from light. (Patient taking differently: Take 20 mg by mouth every Friday. Caution: Chemotherapy. Protect from light. Take 10 mg in the morning and 10 mg at lunch time every Friday), Disp: , Rfl:    MILK THISTLE EXTRACT PO, Take 1 tablet by mouth 3 (three) times daily., Disp: , Rfl:     Omega-3 Fatty Acids (FISH OIL PO), Take 1 capsule by mouth daily., Disp: , Rfl:    potassium chloride (KLOR-CON M) 10 MEQ tablet, Take 1 tablet (10 mEq total) by mouth daily., Disp: 30 tablet, Rfl: 1   predniSONE (DELTASONE) 10 MG tablet, Take 10 mg by mouth daily as needed (RA flare)., Disp: , Rfl:    Red Yeast Rice Extract (RED YEAST RICE PO), Take 1 tablet by mouth 2 (two) times daily., Disp: , Rfl:    omeprazole (PRILOSEC) 20 MG capsule, Take 20 mg by mouth daily as needed (heart burn). (Patient not taking: Reported on 01/05/2023), Disp: , Rfl:    Cardiovascular & other pertient studies:  Reviewed external labs and tests, independently interpreted  Mobile cardiac telemetry 14 days 12/27/2022 - 01/10/2023: Dominant rhythm: Sinus. HR 50-128 bpm. Avg HR 80 bpm, in sinus rhythm. 8 episodes of probable atrial tachycardia, fastest at 200 bpm for 11 beats, longest for 20 beats at 117 bpm. <1% isolated SVE, couplet/triplets. 0 episodes of VT. <1% isolated VE, no couplet/triplets. No atrial fibrillation/atrial flutter/VT/high grade AV block, sinus pause >3sec noted. 8 patient triggered events, correlated with sinus rhythm occasional SVE.  EKG 01/05/2023: Sinus rhythm 83 bpm  Normal EKG   Recent labs: 01/05/2023:  Glucose 105, BUN/Cr 14/0.72. EGFR 87. Na/K 139/3.8.   12/29/2022: Glucose 104, BUN/Cr 9/0.78. EGFR >60. Na/K 133/3.5. Protein 6.2. Rest of the CMP normal H/H 12/41. MCV 99. Platelets 366 TSH 0.8 normal   Latest Reference Range & Units 12/24/22 05:52 12/24/22 10:08 12/25/22 04:02 12/26/22 04:35 12/29/22 18:42  Sodium 135 - 145 mmol/L 128 (L) 127 (L) 128 (L) 132 (L) 133 (L)  (L): Data is abnormally low   Review of Systems  Constitutional:       Feels jittery  Cardiovascular:  Negative for chest pain, dyspnea on exertion, leg swelling, palpitations and syncope.         Vitals:   01/24/23 1351  BP: 138/69  Pulse: 85  Resp: 16  SpO2: 95%    Body mass index is 26.22  kg/m. Filed Weights   01/24/23 1351  Weight: 148 lb (67.1 kg)    Objective:   Physical Exam Vitals and nursing note reviewed.  Constitutional:      General: She is not in acute distress. Neck:     Vascular: No JVD.  Cardiovascular:     Rate and Rhythm: Normal rate and regular rhythm.     Heart sounds: Normal heart sounds. No murmur heard. Pulmonary:     Effort: Pulmonary effort is normal.     Breath sounds: Normal breath sounds. No wheezing or rales.  Musculoskeletal:     Right lower leg: No edema.     Left lower leg: No edema.             Visit diagnoses:   ICD-10-CM   1. PAF (paroxysmal atrial fibrillation) (HCC)  I48.0          Assessment & Recommendations:   77 y.o. Caucasian female  with hyperlipidemia, RA, PAF, hyponatremia due to SIADH  PAF: PAF during hospitalization in 12/2022. No recurrence of A-fib on monitor, but she did have episodes of atrial arrhythmia. Explained to the patient that even though she had a brief A-fib episode during hospitalization, she remains at risk of recurrent A-fib at the rate of roughly 8%/year. CHA2DS2VASc score 3, annual stroke risk 3.6%. I do think benefits of anticoagulation outweigh the risks.  I recommend continuing Eliquis 5 mg twice daily. Currently, she has minimal easy bleeding side effects.  I explained to the patient, that should she have any high risk bleeding events, we could refer her for consideration for left atrial appendage closure.  At this time, anticoagulation is appropriate. I provided samples, co-pay card, and patient assistance form for Eliquis. Continue diltiazem 60 mg twice daily.   Time spent: 30 min      Nigel Mormon, MD Pager: [phone] Office: [phone]

## 2023-07-04 ENCOUNTER — Other Ambulatory Visit: Payer: Self-pay | Admitting: Cardiology

## 2023-07-12 ENCOUNTER — Other Ambulatory Visit: Payer: Self-pay | Admitting: Hematology and Oncology

## 2023-07-12 DIAGNOSIS — Z1231 Encounter for screening mammogram for malignant neoplasm of breast: Secondary | ICD-10-CM

## 2023-08-14 ENCOUNTER — Ambulatory Visit: Payer: Medicare Other | Attending: Cardiology | Admitting: Cardiology

## 2023-08-14 ENCOUNTER — Encounter: Payer: Self-pay | Admitting: Cardiology

## 2023-08-14 VITALS — BP 138/58 | HR 65 | Resp 16 | Ht 63.0 in | Wt 153.0 lb

## 2023-08-14 DIAGNOSIS — I480 Paroxysmal atrial fibrillation: Secondary | ICD-10-CM | POA: Insufficient documentation

## 2023-08-14 MED ORDER — APIXABAN 5 MG PO TABS: 5.0000 mg | ORAL_TABLET | Freq: Two times a day (BID) | ORAL | 6 refills | Status: AC

## 2023-08-14 NOTE — Patient Instructions (Signed)
Medication Instructions:  The current medical regimen is effective;  continue present plan and medications.  *If you need a refill on your cardiac medications before your next appointment, please call your pharmacy*   Follow-Up: At [facility], you and your health needs are our priority.  As part of our continuing mission to provide you with exceptional heart care, we have created designated Provider Care Teams.  These Care Teams include your primary Cardiologist (physician) and Advanced Practice Providers (APPs -  Physician Assistants and Nurse Practitioners) who all work together to provide you with the care you need, when you need it.  We recommend signing up for the patient portal called "MyChart".  Sign up information is provided on this After Visit Summary.  MyChart is used to connect with patients for Virtual Visits (Telemedicine).  Patients are able to view lab/test results, encounter notes, upcoming appointments, etc.  Non-urgent messages can be sent to your provider as well.   To learn more about what you can do with MyChart, go to [email or web address].    Your next appointment:   1 year(s)  Provider:   Dr Rosemary Holms

## 2023-08-14 NOTE — Progress Notes (Signed)
Cardiology Office Note:  .   Date:  08/14/2023  ID:  Shelby Mcguire, DOB [birth year], MRN [id number] PCP: Lorenda Ishihara, MD  [facility] Providers Cardiologist:  Truett Mainland, MD PCP: Lorenda Ishihara, MD  Chief Complaint  Patient presents with   PAF (paroxysmal atrial fibrillation)    Follow-up      History of Present Illness: .    Shelby Mcguire is a 77 y.o. female with  hyperlipidemia, RA, PAF, hyponatremia due to SIADH  Patient is doing well, denies chest pain, shortness of breath, palpitations, leg edema, orthopnea, PND, TIA/syncope.  After this discussed, she did not experience symptoms associated with A-fib and was noted during her hospitalization.  She has not had any significant bleeding issues, other than his effusion.  She had a lot of patients about Eliquis today, discussed in detail.     Vitals:   08/14/23 1500  BP: (!) 138/58  Pulse: 65  Resp: 16  SpO2: 95%     ROS:  Review of Systems  Cardiovascular:  Negative for chest pain, dyspnea on exertion, leg swelling, palpitations and syncope.     Studies Reviewed: Marland Kitchen       Labs 05/2023, unremarkable   Risk Assessment/Calculations:   See below regarding Afib  Physical Exam:   Physical Exam Vitals and nursing note reviewed.  Constitutional:      General: She is not in acute distress. Neck:     Vascular: No JVD.  Cardiovascular:     Rate and Rhythm: Normal rate and regular rhythm.     Heart sounds: Normal heart sounds. No murmur heard. Pulmonary:     Effort: Pulmonary effort is normal.     Breath sounds: Normal breath sounds. No wheezing or rales.      VISIT DIAGNOSES:   ICD-10-CM   1. PAF (paroxysmal atrial fibrillation) (HCC)  I48.0        ASSESSMENT AND PLAN: .    Shelby Mcguire is a 77 y.o. female with hyperlipidemia, RA, PAF, hyponatremia due to SIADH   PAF: PAF during hospitalization in 12/2022. No recurrence of A-fib on monitor, but she did have  episodes of atrial arrhythmia. Explained to the patient that even though she had a brief A-fib episode during hospitalization, she remains at risk of recurrent A-fib at the rate of roughly 8%/year. CHA2DS2VASc score 3, annual stroke risk 3.6%. I do think benefits of anticoagulation outweigh the risks.  I recommend continuing Eliquis 5 mg twice daily. Other than easy bruising, no significant bleeding noted. Patient is Jehov[employer]'s Witness and does not accept blood products.  Fortunately, there is no bleeding side effects at this time.  I encouraged the patient to continue checking for stool color and check her CBC and BMP routinely, she gets checked through her PCP and rheumatologist. Continue Eliquis 5 mg twice daily.     Meds ordered this encounter  Medications   apixaban (ELIQUIS) 5 MG TABS tablet    Sig: Take 1 tablet (5 mg total) by mouth 2 (two) times daily.    Dispense:  60 tablet    Refill:  6     F/u in 1 year  Signed, Elder Negus, MD

## 2023-08-17 ENCOUNTER — Ambulatory Visit: Payer: Self-pay | Admitting: Cardiology

## 2023-08-21 ENCOUNTER — Other Ambulatory Visit: Payer: Self-pay | Admitting: Internal Medicine

## 2023-08-21 DIAGNOSIS — R911 Solitary pulmonary nodule: Secondary | ICD-10-CM

## 2023-08-22 ENCOUNTER — Ambulatory Visit
Admission: RE | Admit: 2023-08-22 | Discharge: 2023-08-22 | Disposition: A | Discharge status: Home / Self Care | Payer: Medicare Other | Source: Ambulatory Visit | Attending: Internal Medicine | Admitting: Internal Medicine

## 2023-08-22 DIAGNOSIS — R911 Solitary pulmonary nodule: Secondary | ICD-10-CM

## 2023-09-25 ENCOUNTER — Ambulatory Visit
Admission: RE | Admit: 2023-09-25 | Discharge: 2023-09-25 | Disposition: A | Discharge status: Home / Self Care | Payer: Medicare Other | Source: Ambulatory Visit | Attending: Hematology and Oncology | Admitting: Hematology and Oncology

## 2023-09-25 DIAGNOSIS — Z1231 Encounter for screening mammogram for malignant neoplasm of breast: Secondary | ICD-10-CM

## 2024-07-26 ENCOUNTER — Other Ambulatory Visit: Payer: Self-pay | Admitting: Hematology and Oncology

## 2024-07-26 DIAGNOSIS — Z1231 Encounter for screening mammogram for malignant neoplasm of breast: Secondary | ICD-10-CM

## 2024-08-01 ENCOUNTER — Inpatient Hospital Stay: Attending: Hematology and Oncology | Admitting: Hematology and Oncology

## 2024-08-01 VITALS — BP 148/59 | HR 73 | Temp 98.1°F | Resp 16 | Wt 160.3 lb

## 2024-08-01 DIAGNOSIS — N6091 Unspecified benign mammary dysplasia of right breast: Secondary | ICD-10-CM | POA: Diagnosis present

## 2024-08-01 DIAGNOSIS — Z1239 Encounter for other screening for malignant neoplasm of breast: Secondary | ICD-10-CM | POA: Diagnosis not present

## 2024-08-01 DIAGNOSIS — M069 Rheumatoid arthritis, unspecified: Secondary | ICD-10-CM | POA: Insufficient documentation

## 2024-08-01 NOTE — Assessment & Plan Note (Signed)
 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 These were normal.    Breast cancer Surveillance:  1.  Breast exam not performed because this is a WebEx visit. 2. Breast MRI 08/23/2018 Normal. 3.  Mammogram 09/26/2023: Benign breast density category B   I recommended continued annual mammograms and every other year MRIs.   However these have not been done.  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   Return to clinic in 1 year for breast exam and follow-up

## 2024-08-01 NOTE — Progress Notes (Signed)
 Patient Care Team: Rexanne Ingle, MD as PCP - General (Internal Medicine)  DIAGNOSIS:  Encounter Diagnosis  Name Primary?   Atypical ductal hyperplasia of right breast Yes      CHIEF COMPLIANT: Follow-up of history of ADH and high risk of breast cancer  HISTORY OF PRESENT ILLNESS:   History of Present Illness Shelby Mcguire is a 78 year old female who presents for a routine follow-up and scheduling of her annual mammogram and MRI.  Her last mammogram and MRI were four years ago. She plans to have her MRI in May next year. Her family history includes a sister who died of inflammatory breast cancer. She is currently on Rituxan and methotrexate  for rheumatoid arthritis, with Rituxan showing effectiveness in reducing knuckle swelling. She also takes Eliquis  and a potassium supplement.     ALLERGIES:  is allergic to penicillins and red blood cells.  MEDICATIONS:  Current Outpatient Medications  Medication Sig Dispense Refill   acetaminophen  (TYLENOL ) 650 MG CR tablet Take 1,300 mg by mouth daily as needed for pain.     apixaban  (ELIQUIS ) 5 MG TABS tablet Take 1 tablet (5 mg total) by mouth 2 (two) times daily. 60 tablet 6   ASCORBIC ACID PO Take 1 tablet by mouth daily.     BIOTIN PO Take 1 tablet by mouth daily.     Calcium Carb-Cholecalciferol (CALCIUM + VITAMIN D3 PO) Take 1 tablet by mouth 2 (two) times daily.     Cholecalciferol (VITAMIN D-3 PO) Take 1 capsule by mouth daily.     diltiazem  (CARDIZEM  SR) 60 MG 12 hr capsule Take 1 capsule (60 mg total) by mouth every 12 (twelve) hours. 60 capsule 2   folic acid (FOLVITE) 1 MG tablet Take 1 mg by mouth 3 (three) times daily.     furosemide  (LASIX ) 40 MG tablet Take 1 tablet (40 mg total) by mouth daily. 30 tablet 2   methotrexate  2.5 MG tablet Take 8 tablets (20 mg total) by mouth once a week. Caution: Chemotherapy. Protect from light. (Patient taking differently: Take 20 mg by mouth every Friday. Caution: Chemotherapy.  Protect from light. Take 10 mg in the morning and 10 mg at lunch time every Friday)     MILK THISTLE EXTRACT PO Take 1 tablet by mouth 3 (three) times daily.     Omega-3 Fatty Acids (FISH OIL PO) Take 1 capsule by mouth daily.     potassium chloride  (KLOR-CON  M) 10 MEQ tablet Take 1 tablet (10 mEq total) by mouth daily. 30 tablet 1   predniSONE  (DELTASONE ) 10 MG tablet Take 10 mg by mouth daily as needed (RA flare).     Red Yeast Rice Extract (RED YEAST RICE PO) Take 1 tablet by mouth 2 (two) times daily.     No current facility-administered medications for this visit.    PHYSICAL EXAMINATION: ECOG PERFORMANCE STATUS: 1 - Symptomatic but completely ambulatory  Vitals:   08/01/24 1431  BP: (!) 148/59  Pulse: 73  Resp: 16  Temp: 98.1 F (36.7 C)  SpO2: 98%   Filed Weights   08/01/24 1431  Weight: 160 lb 4.8 oz (72.7 kg)    Physical Exam BREAST: Breasts symmetrical, no masses, no tenderness.  (exam performed in the presence of a chaperone)  LABORATORY DATA:  I have reviewed the data as listed    Latest Ref Rng & Units 01/05/2023    2:39 PM 12/29/2022    6:42 PM 12/26/2022    4:35 AM  CMP  Glucose 70 - 99 mg/dL 894  895  891   BUN 8 - 27 mg/dL 14  9  8    Creatinine 0.57 - 1.00 mg/dL 9.27  9.21  9.50   Sodium 134 - 144 mmol/L 139  133  132   Potassium 3.5 - 5.2 mmol/L 3.8  3.5  4.1   Chloride 96 - 106 mmol/L 97  96  97   CO2 20 - 29 mmol/L 27  26  24    Calcium 8.7 - 10.3 mg/dL 9.7  9.0  8.6   Total Protein 6.5 - 8.1 g/dL  6.2    Total Bilirubin 0.3 - 1.2 mg/dL  1.4    Alkaline Phos 38 - 126 U/L  54    AST 15 - 41 U/L  22    ALT 0 - 44 U/L  27      Lab Results  Component Value Date   WBC 9.0 12/29/2022   HGB 12.8 12/29/2022   HCT 41.4 12/29/2022   MCV 99.3 12/29/2022   PLT 366 12/29/2022   NEUTROABS 5.4 12/29/2022    ASSESSMENT & PLAN:  Atypical ductal hyperplasia of right breast Right breast Atypical ductal hyperplasia with very high risk family history of  breast cancer:   Breast cancer Surveillance:  1.  Breast exam: 08/01/2024: Benign 2. Breast MRI 08/23/2018 Normal. 3.  Mammogram 09/26/2023: Benign breast density category B   I recommended continued annual mammograms and every other year MRIs.  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   Return to clinic in 1 year for breast exam and follow-up  Assessment & Plan Benign mammary dysplasia of right breast Condition well-managed. Last mammogram normal. Family history of inflammatory breast cancer requires increased vigilance. - Schedule annual mammogram. - Separate mammogram and MRI by six months. - Order MRI for end of May at [facility]  Rheumatoid arthritis Condition well-managed with Rituxan and methotrexate . Symptoms improved, especially in knuckles. - Continue Rituxan and methotrexate .      No orders of the defined types were placed in this encounter.  The patient has a good understanding of the overall plan. she agrees with it. she will call with any problems that may develop before the next visit here. Total time spent: 30 mins including face to face time and time spent for planning, charting and co-ordination of care   Viinay K Antonella Upson, MD 08/01/24

## 2024-08-05 ENCOUNTER — Telehealth: Payer: Self-pay | Admitting: Cardiology

## 2024-08-05 NOTE — Telephone Encounter (Signed)
   Pre-operative Risk Assessment    Patient Name: Shelby Mcguire  DOB: December 31, 1945 MRN: [id number]   Date of last office visit: 08/14/23 Date of next office visit: 10/25 recall not scheduled yet   Request for Surgical Clearance    Procedure:  Dental Extraction - Amount of Teeth to be Pulled:  1 tooth   Date of Surgery:  Clearance 08/13/24                                Surgeon:  Colen Ivory  Surgeon's Group or Practice Name:  [facility]  Phone number:  [phone] Fax number:  [phone]   Type of Clearance Requested:   - Pharmacy:  Hold Apixaban  (Eliquis ) defer to cards    Type of Anesthesia:  Local    Additional requests/questions:    [name]   08/05/2024, 9:02 AM

## 2024-08-05 NOTE — Telephone Encounter (Signed)
    Primary Cardiologist: Dr. Elmira  Chart reviewed as part of pre-operative protocol coverage. Simple dental extractions (1-2 teeth) are considered low risk procedures per guidelines and generally do not require any specific cardiac clearance. It is also generally accepted that for simple extractions and dental cleanings, there is no need to interrupt blood thinner therapy.   SBE prophylaxis is not required for the patient.  I will route this recommendation to the requesting party via Epic fax function and remove from pre-op pool.  Please call with questions.  Cataleah Stites D Jaleeyah Munce, NP 08/05/2024, 9:36 AM

## 2024-08-15 ENCOUNTER — Other Ambulatory Visit (HOSPITAL_BASED_OUTPATIENT_CLINIC_OR_DEPARTMENT_OTHER): Payer: Self-pay | Admitting: Internal Medicine

## 2024-08-15 DIAGNOSIS — M810 Age-related osteoporosis without current pathological fracture: Secondary | ICD-10-CM

## 2024-09-02 ENCOUNTER — Other Ambulatory Visit: Payer: Self-pay | Admitting: Internal Medicine

## 2024-09-02 DIAGNOSIS — R911 Solitary pulmonary nodule: Secondary | ICD-10-CM

## 2024-09-09 ENCOUNTER — Ambulatory Visit
Admission: RE | Admit: 2024-09-09 | Discharge: 2024-09-09 | Disposition: A | Source: Ambulatory Visit | Attending: Internal Medicine | Admitting: Internal Medicine

## 2024-09-09 DIAGNOSIS — R911 Solitary pulmonary nodule: Secondary | ICD-10-CM

## 2024-09-26 ENCOUNTER — Ambulatory Visit
Admission: RE | Admit: 2024-09-26 | Discharge: 2024-09-26 | Disposition: A | Discharge status: Home / Self Care | Source: Ambulatory Visit | Attending: Hematology and Oncology | Admitting: Hematology and Oncology

## 2024-09-26 DIAGNOSIS — Z1231 Encounter for screening mammogram for malignant neoplasm of breast: Secondary | ICD-10-CM

## 2024-10-02 ENCOUNTER — Encounter: Payer: Self-pay | Admitting: Cardiology

## 2024-10-02 ENCOUNTER — Other Ambulatory Visit (HOSPITAL_COMMUNITY): Payer: Self-pay

## 2024-10-02 ENCOUNTER — Ambulatory Visit: Attending: Cardiology | Admitting: Cardiology

## 2024-10-02 VITALS — BP 132/56 | HR 65 | Ht 63.0 in | Wt 160.0 lb

## 2024-10-02 DIAGNOSIS — E782 Mixed hyperlipidemia: Secondary | ICD-10-CM | POA: Diagnosis not present

## 2024-10-02 DIAGNOSIS — I25118 Atherosclerotic heart disease of native coronary artery with other forms of angina pectoris: Secondary | ICD-10-CM | POA: Insufficient documentation

## 2024-10-02 DIAGNOSIS — I480 Paroxysmal atrial fibrillation: Secondary | ICD-10-CM | POA: Insufficient documentation

## 2024-10-02 MED ORDER — NITROGLYCERIN 0.4 MG SL SUBL
0.4000 mg | SUBLINGUAL_TABLET | SUBLINGUAL | 3 refills | Status: DC | PRN
  Filled 2024-10-02: qty 25, 8d supply, fill #0

## 2024-10-02 NOTE — Progress Notes (Signed)
 Cardiology Office Note:  .   Date:  10/02/2024  ID:  Shelby Mcguire, DOB 10-23-1946, MRN [id number] PCP: Rexanne Ingle, MD  [facility] Providers Cardiologist:  Newman Lawrence, MD PCP: Rexanne Ingle, MD  Chief Complaint  Patient presents with   Chest pain     Shelby Mcguire is a 78 y.o. female with hyperlipidemia, RA, PAF, hyponatremia due to SIADH   Discussed the use of AI scribe software for clinical note transcription with the patient, who gave verbal consent to proceed.  History of Present Illness Shelby Mcguire is a 78 year old female with atrial fibrillation and coronary artery calcifications who presents with chest pain.  She has a sharp and fluttering ache in the left chest. The ache occurs about weekly. Each episode lasts a few seconds and resolves spontaneously, without clear triggers or relieving factors. She denies associated shortness of breath.  She has atrial fibrillation and was told she has coronary artery calcifications on a CT scan last year. She recalls being prescribed a heart medication for this but does not know the name and is not on a cholesterol medication.  She takes Eliquis  but is unsure about her refill status. She also takes methotrexate  and long term red yeast extract.  Recent labs showed elevated cholesterol with total cholesterol 216, increased from 112 in April.      Vitals:   10/02/24 0844  BP: (!) 132/56  Pulse: 65  SpO2: 96%      Review of Systems  Cardiovascular:  Positive for chest pain. Negative for dyspnea on exertion, leg swelling, palpitations and syncope.        Studies Reviewed: SABRA        EKG 10/02/2024: Normal sinus rhythm Low voltage QRS Cannot rule out Anterior infarct , age undetermined When compared with ECG of 29-Dec-2022 17:40, No significant change was found  Echocardiogram 2024:  1. Left ventricular ejection fraction, by estimation, is 65 to 70%. The  left ventricle has normal  function. The left ventricle has no regional  wall motion abnormalities. Left ventricular diastolic parameters are  indeterminate.   2. Right ventricular systolic function is normal. The right ventricular  size is normal.   3. The mitral valve is abnormal. Trivial mitral valve regurgitation. No  evidence of mitral stenosis.   4. The aortic valve was not well visualized. There is moderate  calcification of the aortic valve. There is moderate thickening of the  aortic valve. Aortic valve regurgitation is mild. Aortic valve  sclerosis/calcification is present, without any evidence   of aortic stenosis.   5. The inferior vena cava is normal in size with greater than 50%  respiratory variability, suggesting right atrial pressure of 3 mmHg.   Mobile cardiac telemetry 2024: Dominant rhythm: Sinus. HR 50-128 bpm. Avg HR 80 bpm, in sinus rhythm. 8 episodes of probable atrial tachycardia, fastest at 200 bpm for 11 beats, longest for 20 beats at 117 bpm. <1% isolated SVE, couplet/triplets. 0 episodes of VT. <1% isolated VE, no couplet/triplets. No atrial fibrillation/atrial flutter/VT/high grade AV block, sinus pause >3sec noted. 8 patient triggered events, correlated with sinus rhythm occasional SVE.     Risk Assessment/Calculations:    CHA2DS2-VASc Score = 3  This indicates a 3.2% annual risk of stroke. The patient's score is based upon: CHF History: 0 HTN History: 0 Diabetes History: 0 Stroke History: 0 Vascular Disease History: 0 Age Score: 2 Gender Score: 1     Physical Exam Vitals and nursing  note reviewed.  Constitutional:      General: She is not in acute distress. Neck:     Vascular: No JVD.  Cardiovascular:     Rate and Rhythm: Normal rate and regular rhythm.     Heart sounds: Normal heart sounds. No murmur heard. Pulmonary:     Effort: Pulmonary effort is normal.     Breath sounds: Normal breath sounds. No wheezing or rales.  Musculoskeletal:     Right lower  leg: No edema.     Left lower leg: No edema.      VISIT DIAGNOSES:   ICD-10-CM   1. PAF (paroxysmal atrial fibrillation) (HCC)  I48.0 EKG 12-Lead    2. Coronary artery disease of native artery of native heart with stable angina pectoris  I25.118 MYOCARDIAL PERFUSION IMAGING    Lipid panel    AMB Referral to Nutrition    Lipid panel    Cardiac Stress Test: Informed Consent Details: Physician/Practitioner Attestation; Transcribe to consent form and obtain patient signature    3. Mixed hyperlipidemia  E78.2        Favor Shelby Mcguire is a 78 y.o. female with hyperlipidemia, RA, PAF, hyponatremia due to SIADH   Assessment & Plan Precordial pain: Intermittent left-sided chest pain.  While it is not clearly anginal by history, she does have coronary calcification and risk factors for CAD. Recommend exercise nuclear stress test for further evaluation. Prescribe sublingual nitroglycerin  for as needed use for severe chest pain.  Paroxysmal atrial fibrillation: Only occasional palpitation symptoms. Continue anticoagulation with Eliquis  5 mg twice daily. Continue diltiazem  60 mg twice daily.  Mixed hyperlipidemia: Total cholesterol 216 mg/dL, LDL 887 mg/dL. Discussed cholesterol reduction to lower cardiovascular risk. She is hesitant about statins due to liver health concerns, agreed to dietary changes first. Okay to continue Zetia 10 mg daily. Repeat lipid panel in 3 months..   Informed Consent   Shared Decision Making/Informed Consent The risks [chest pain, shortness of breath, cardiac arrhythmias, dizziness, blood pressure fluctuations, myocardial infarction, stroke/transient ischemic attack, nausea, vomiting, allergic reaction, radiation exposure, metallic taste sensation and life-threatening complications (estimated to be 1 in 10,000)], benefits (risk stratification, diagnosing coronary artery disease, treatment guidance) and alternatives of a nuclear stress test were discussed in  detail with Ms. Maciver and she agrees to proceed.       No orders of the defined types were placed in this encounter.    F/u in 3 months  Signed, Newman JINNY Lawrence, MD

## 2024-10-02 NOTE — Patient Instructions (Signed)
 Medication Instructions:  START AS NEEDED FOR CHEST PAIN: Nitroglycerin    *If you need a refill on your cardiac medications before your next appointment, please call your pharmacy*  Lab Work: Lipid panel in 3 months   If you have labs (blood work) drawn today and your tests are completely normal, you will receive your results only by: MyChart Message (if you have MyChart) OR A paper copy in the mail If you have any lab test that is abnormal or we need to change your treatment, we will call you to review the results.  Testing/Procedures: Exercise nuclear stress test   Your physician has requested that you have en exercise stress myoview. For further information please visit [email or web address] Please follow instruction sheet, as given.   Follow-Up: At [facility], you and your health needs are our priority.  As part of our continuing mission to provide you with exceptional heart care, our providers are all part of one team.  This team includes your primary Cardiologist (physician) and Advanced Practice Providers or APPs (Physician Assistants and Nurse Practitioners) who all work together to provide you with the care you need, when you need it.  Your next appointment:   3 month(s)  Provider:   Newman JINNY Lawrence, MD

## 2024-10-03 ENCOUNTER — Other Ambulatory Visit: Payer: Self-pay | Admitting: Physician Assistant

## 2024-10-03 MED ORDER — NITROGLYCERIN 0.4 MG SL SUBL: 0.4000 mg | SUBLINGUAL_TABLET | SUBLINGUAL | 3 refills | Status: AC | PRN

## 2024-10-04 ENCOUNTER — Other Ambulatory Visit (HOSPITAL_COMMUNITY): Payer: Self-pay

## 2024-10-10 ENCOUNTER — Telehealth (HOSPITAL_COMMUNITY): Payer: Self-pay | Admitting: Radiology

## 2024-10-10 ENCOUNTER — Telehealth (HOSPITAL_COMMUNITY): Payer: Self-pay | Admitting: *Deleted

## 2024-10-10 NOTE — Telephone Encounter (Signed)
 Patient given detailed instructions per Myocardial Perfusion Study Information Sheet for the test on 12/11 at 8am. Patient notified to arrive 15 minutes early and that it is imperative to arrive on time for appointment to keep from having the test rescheduled.  If you need to cancel or reschedule your appointment, please call the office within 24 hours of your appointment. . Patient verbalized understanding.EHK

## 2024-10-10 NOTE — Telephone Encounter (Signed)
 Left detailed instructions for stress test on pt's vm.

## 2024-10-15 ENCOUNTER — Other Ambulatory Visit: Payer: Self-pay | Admitting: Cardiology

## 2024-10-15 DIAGNOSIS — I25118 Atherosclerotic heart disease of native coronary artery with other forms of angina pectoris: Secondary | ICD-10-CM

## 2024-10-17 ENCOUNTER — Inpatient Hospital Stay (HOSPITAL_COMMUNITY)
Admission: RE | Admit: 2024-10-17 | Discharge: 2024-10-17 | Disposition: A | Source: Ambulatory Visit | Attending: Internal Medicine

## 2024-10-17 ENCOUNTER — Ambulatory Visit: Payer: Self-pay | Admitting: Cardiology

## 2024-10-17 DIAGNOSIS — I25118 Atherosclerotic heart disease of native coronary artery with other forms of angina pectoris: Secondary | ICD-10-CM | POA: Diagnosis present

## 2024-10-17 LAB — MYOCARDIAL PERFUSION IMAGING
Angina Index: 0
Base ST Depression (mm): 0 mm
Estimated workload: 7.2
Exercise duration (min): 6 min
Exercise duration (sec): 1 s
LV dias vol: 87 mL (ref 46–106)
LV sys vol: 29 mL (ref 3.8–5.2)
MPHR: 143 {beats}/min
Nuc Stress EF: 67 %
Peak HR: 142 {beats}/min
Percent HR: 99 %
RPE: 18
Rest HR: 67 {beats}/min
Rest Nuclear Isotope Dose: 10.1 mCi
SDS: 0
SRS: 3
SSS: 0
Stress Nuclear Isotope Dose: 32.6 mCi
TID: 0.88

## 2024-10-17 MED ORDER — TECHNETIUM TC 99M TETROFOSMIN IV KIT
10.1000 | PACK | Freq: Once | INTRAVENOUS | Status: AC | PRN
  Administered 2024-10-17: 10.1 via INTRAVENOUS

## 2024-10-17 MED ORDER — TECHNETIUM TC 99M TETROFOSMIN IV KIT
32.6000 | PACK | Freq: Once | INTRAVENOUS | Status: AC | PRN
  Administered 2024-10-17: 32.6 via INTRAVENOUS

## 2024-10-17 NOTE — Progress Notes (Signed)
 Letter mailed

## 2024-10-17 NOTE — Progress Notes (Signed)
 No significant heart muscle circulation abnormalities noted on stress test. Continue current medications.  Thanks MJP

## 2024-10-22 NOTE — Telephone Encounter (Signed)
Patient called to follow-up on test results. 

## 2024-12-10 ENCOUNTER — Telehealth: Payer: Self-pay | Admitting: Cardiology

## 2024-12-10 NOTE — Telephone Encounter (Signed)
 Patient stated she has a colonoscopy today and the anesthesiologist noted she previously had a stroke.  Patient wants a call back to discuss this diagnosis being removed from her record.

## 2025-01-09 ENCOUNTER — Ambulatory Visit: Admitting: Cardiology

## 2025-08-14 ENCOUNTER — Ambulatory Visit: Admitting: Hematology and Oncology
